# Patient Record
Sex: Male | Born: 1935 | Race: White | Hispanic: No | Marital: Married | Smoking: Former smoker
Health system: Southern US, Community
[De-identification: ages and names within clinical notes are randomized; demographics above are authoritative.]

## PROBLEM LIST (undated history)

## (undated) DIAGNOSIS — E7800 Pure hypercholesterolemia, unspecified: Secondary | ICD-10-CM

## (undated) DIAGNOSIS — K219 Gastro-esophageal reflux disease without esophagitis: Secondary | ICD-10-CM

## (undated) DIAGNOSIS — M1990 Unspecified osteoarthritis, unspecified site: Secondary | ICD-10-CM

## (undated) DIAGNOSIS — R011 Cardiac murmur, unspecified: Secondary | ICD-10-CM

## (undated) DIAGNOSIS — N400 Enlarged prostate without lower urinary tract symptoms: Secondary | ICD-10-CM

## (undated) HISTORY — PX: BACK SURGERY: SHX140

## (undated) HISTORY — PX: CAROTID ENDARTERECTOMY: SUR193

## (undated) HISTORY — PX: TONSILLECTOMY: SUR1361

## (undated) HISTORY — PX: VASCULAR SURGERY: SHX849

---

## 2001-04-23 ENCOUNTER — Encounter: Payer: Self-pay | Admitting: Family Medicine

## 2001-04-23 ENCOUNTER — Ambulatory Visit (HOSPITAL_COMMUNITY): Admission: RE | Admit: 2001-04-23 | Discharge: 2001-04-23 | Payer: Self-pay | Admitting: Family Medicine

## 2011-02-03 ENCOUNTER — Emergency Department (HOSPITAL_COMMUNITY)
Admission: EM | Admit: 2011-02-03 | Discharge: 2011-02-03 | Disposition: A | Discharge status: Home / Self Care | Payer: Medicare PPO | Attending: Emergency Medicine | Admitting: Emergency Medicine

## 2011-02-03 ENCOUNTER — Emergency Department (HOSPITAL_COMMUNITY): Payer: Medicare PPO

## 2011-02-03 DIAGNOSIS — Z79899 Other long term (current) drug therapy: Secondary | ICD-10-CM | POA: Insufficient documentation

## 2011-02-03 DIAGNOSIS — M5126 Other intervertebral disc displacement, lumbar region: Secondary | ICD-10-CM | POA: Insufficient documentation

## 2011-02-03 DIAGNOSIS — R1031 Right lower quadrant pain: Secondary | ICD-10-CM | POA: Insufficient documentation

## 2011-02-03 DIAGNOSIS — R9431 Abnormal electrocardiogram [ECG] [EKG]: Secondary | ICD-10-CM | POA: Insufficient documentation

## 2011-02-03 DIAGNOSIS — K5730 Diverticulosis of large intestine without perforation or abscess without bleeding: Secondary | ICD-10-CM | POA: Insufficient documentation

## 2011-02-03 DIAGNOSIS — I714 Abdominal aortic aneurysm, without rupture, unspecified: Secondary | ICD-10-CM | POA: Insufficient documentation

## 2011-02-03 DIAGNOSIS — I700 Atherosclerosis of aorta: Secondary | ICD-10-CM | POA: Insufficient documentation

## 2011-02-03 DIAGNOSIS — Z7982 Long term (current) use of aspirin: Secondary | ICD-10-CM | POA: Insufficient documentation

## 2011-02-03 DIAGNOSIS — I70209 Unspecified atherosclerosis of native arteries of extremities, unspecified extremity: Secondary | ICD-10-CM | POA: Insufficient documentation

## 2011-02-03 LAB — DIFFERENTIAL
Basophils Absolute: 0.1 10*3/uL (ref 0.0–0.1)
Basophils Relative: 1 % (ref 0–1)
Eosinophils Absolute: 0.4 10*3/uL (ref 0.0–0.7)
Eosinophils Relative: 3 % (ref 0–5)
Lymphocytes Relative: 22 % (ref 12–46)
Lymphs Abs: 3.1 10*3/uL (ref 0.7–4.0)
Monocytes Absolute: 1 10*3/uL (ref 0.1–1.0)
Monocytes Relative: 7 % (ref 3–12)
Neutro Abs: 9.6 10*3/uL — ABNORMAL HIGH (ref 1.7–7.7)
Neutrophils Relative %: 67 % (ref 43–77)

## 2011-02-03 LAB — COMPREHENSIVE METABOLIC PANEL
ALT: 17 U/L (ref 0–53)
AST: 19 U/L (ref 0–37)
Albumin: 3.7 g/dL (ref 3.5–5.2)
Alkaline Phosphatase: 74 U/L (ref 39–117)
BUN: 16 mg/dL (ref 6–23)
CO2: 30 mEq/L (ref 19–32)
Calcium: 9.6 mg/dL (ref 8.4–10.5)
Chloride: 99 mEq/L (ref 96–112)
Creatinine, Ser: 0.89 mg/dL (ref 0.4–1.5)
GFR calc Af Amer: >60 mL/min (ref >60)
GFR calc non Af Amer: >60 mL/min (ref >60)
Glucose, Bld: 94 mg/dL (ref 70–99)
Potassium: 4.2 mEq/L (ref 3.5–5.1)
Sodium: 136 mEq/L (ref 135–145)
Total Bilirubin: 1 mg/dL (ref 0.3–1.2)
Total Protein: 7.5 g/dL (ref 6.0–8.3)

## 2011-02-03 LAB — CBC
HCT: 40.7 % (ref 39.0–52.0)
Hemoglobin: 14.2 g/dL (ref 13.0–17.0)
MCH: 31.3 pg (ref 26.0–34.0)
MCHC: 34.9 g/dL (ref 30.0–36.0)
MCV: 89.8 fL (ref 78.0–100.0)
Platelets: 242 10*3/uL (ref 150–400)
RBC: 4.53 MIL/uL (ref 4.22–5.81)
RDW: 12.8 % (ref 11.5–15.5)
WBC: 14.3 10*3/uL — ABNORMAL HIGH (ref 4.0–10.5)

## 2011-02-03 LAB — URINALYSIS, ROUTINE W REFLEX MICROSCOPIC
Bilirubin Urine: NEGATIVE
Glucose, UA: NEGATIVE mg/dL
Hgb urine dipstick: NEGATIVE
Ketones, ur: NEGATIVE mg/dL
Nitrite: NEGATIVE
Protein, ur: NEGATIVE mg/dL
Specific Gravity, Urine: 1.015 (ref 1.005–1.030)
Urobilinogen, UA: 0.2 mg/dL (ref 0.0–1.0)
pH: 5.5 (ref 5.0–8.0)

## 2011-02-03 MED ORDER — IOHEXOL 300 MG/ML  SOLN
100.0000 mL | Freq: Once | INTRAMUSCULAR | Status: AC | PRN
  Administered 2011-02-03: 100 mL via INTRAVENOUS

## 2012-02-13 ENCOUNTER — Other Ambulatory Visit (HOSPITAL_COMMUNITY): Payer: Self-pay | Admitting: Family Medicine

## 2012-02-13 DIAGNOSIS — N509 Disorder of male genital organs, unspecified: Secondary | ICD-10-CM

## 2012-02-14 ENCOUNTER — Ambulatory Visit (HOSPITAL_COMMUNITY)
Admission: RE | Admit: 2012-02-14 | Discharge: 2012-02-14 | Disposition: A | Discharge status: Home / Self Care | Payer: Medicare PPO | Source: Ambulatory Visit | Attending: Family Medicine | Admitting: Family Medicine

## 2012-02-14 ENCOUNTER — Other Ambulatory Visit (HOSPITAL_COMMUNITY): Payer: Self-pay | Admitting: Family Medicine

## 2012-02-14 DIAGNOSIS — I861 Scrotal varices: Secondary | ICD-10-CM | POA: Insufficient documentation

## 2012-02-14 DIAGNOSIS — N509 Disorder of male genital organs, unspecified: Secondary | ICD-10-CM | POA: Insufficient documentation

## 2013-02-25 ENCOUNTER — Ambulatory Visit (HOSPITAL_COMMUNITY)
Admission: RE | Admit: 2013-02-25 | Discharge: 2013-02-25 | Disposition: A | Discharge status: Home / Self Care | Payer: Medicare Other | Source: Ambulatory Visit | Attending: Family Medicine | Admitting: Family Medicine

## 2013-02-25 DIAGNOSIS — M545 Low back pain, unspecified: Secondary | ICD-10-CM | POA: Insufficient documentation

## 2013-02-25 DIAGNOSIS — M6281 Muscle weakness (generalized): Secondary | ICD-10-CM | POA: Insufficient documentation

## 2013-02-25 DIAGNOSIS — IMO0001 Reserved for inherently not codable concepts without codable children: Secondary | ICD-10-CM | POA: Insufficient documentation

## 2013-02-25 NOTE — Evaluation (Signed)
Physical Therapy Evaluation  Patient Details  Name: Kevin Matthews MRN: [id number] Date of Birth: Mar 26, 1934  Today's Date: 02/25/2013 Time: 0950-1030 PT Time Calculation (min): 40 min Charges: 1 eval, 10' Self Care             Visit#: 1 of 8  Re-eval: 03/27/13 Assessment Diagnosis: LBP Surgical Date: 11/27/12 Next MD Visit: Dr. Regino Schultze - unscheduled   Authorization: Medicare    Authorization Time Period:    Authorization Visit#: 1 of 10   Past Medical History: Numb legs, heart trouble (murmer, aortic anyersium 3x6 Past Surgical History: 2 cervical surgeries (1970), lumbar surgery 11/27/12  Subjective Symptoms/Limitations Symptoms: signgificant PMH: 2 cervical fusions (impairments to LUE and LLE coordination) , lumbar fusion; Medications: asprin, Gabapentin, Loratadine, pravastatin sodium, vicodin Pertinent History: Pt is referred to PT for cervical and LBP.  He has a history of 2 cervical surgiers and most recent lumbar surgery on 11/27/12 (through his [place]).  He has been working on this TM and is now able to walk through [employer] for about 20 minutes (was 5 minutes), c/o is difficulty standing for long periods of time, difficulty walking, pain to his back limiting his ability to participate in golf activities.  How long can you sit comfortably?: no difficulty  How long can you stand comfortably?: less than 10 minutes How long can you walk comfortably?: less than 1/4 mile Patient Stated Goals: wants his back pain to ease up so that he can play golf.  Pain Assessment Currently in Pain?: Yes Pain Score:   3 Pain Location: Back Pain Orientation: Mid;Lower Pain Type: Chronic pain;Acute pain;Surgical pain Pain Onset: More than a month ago Pain Frequency: Constant Pain Relieving Factors: pain medication PRN Effect of Pain on Daily Activities: unable to play golf  Precautions/Restrictions   No golf  Balance Screening Balance Screen Has the patient fallen in the past 6 months:  Yes How many times?: 3 Has the patient had a decrease in activity level because of a fear of falling? : Yes Is the patient reluctant to leave their home because of a fear of falling? : No  Prior Functioning  Home Living Lives With: Spouse Prior Function Comments: enjoys playing golf  Cognition/Observation Observation/Other Assessments Observations: signfiicant pitting edema to LLE.  Sensation/Coordination/Flexibility/Functional Tests Sensation Light Touch: Impaired by gross assessment Coordination Gross Motor Movements are Fluid and Coordinated: No Coordination and Movement Description: impaired LLE SLR.  impaired coordination requiring TC and VC for activation of: transverse abdominus, pelvic floor and multifidus musculature Finger Nose Finger Test: impaired to LUE Flexibility Hoovers: Positive Thomas: Positive Functional Tests Functional Tests: Oswestry Disability Index  (ODI):  Functional Tests: + Ely's  RLE Strength Right Hip Flexion: 4/5 Right Hip Extension: 3-/5 Right Hip ABduction: 3+/5 Right Knee Flexion: 4/5 Right Knee Extension: 5/5 Right Ankle Dorsiflexion: 4/5 LLE Strength Left Hip Flexion: 3+/5 Left Hip Extension: 3-/5 Left Hip ABduction: 3+/5 Left Knee Flexion: 4/5 Left Knee Extension: 5/5 Left Ankle Dorsiflexion: 3+/5  Palpation: fascial restriction to lumbar scar, increased muscle spasms to lumbar erector spinae.   Mobility/Balance  Ambulation/Gait Ambulation/Gait: Yes Assistive device: Straight cane Gait Pattern: Lateral hip instability;Ataxic Static Standing Balance Single Leg Stance - Right Leg: 3 Single Leg Stance - Left Leg: 2 Tandem Stance - Right Leg: 7 Tandem Stance - Left Leg: 5 Rhomberg - Eyes Opened: 10 Rhomberg - Eyes Closed: 1   Exercise/Treatments Seated Other Seated Lumbar Exercises: Heel Roll outs 2x10 sec holds Supine Ab Set: Limitations AB Set  Limitations: VC and TC for proper activition, 3x10 sec holds after..  Pelvic  Floor set: VC for activation 2x10 sec holds Quadruped Other Quadruped Lumbar Exercises: Mulifidus: VC and TC for activation 2x10 sec holds   Physical Therapy Assessment and Plan PT Assessment and Plan Clinical Impression Statement: Kevin Matthews is a 79 year old male referred to PT s/p lumbar fusion in [place] with significant cervical surgeries 30 years ago which left him with LUE and LLE coordination impairments which he has been able to overcome.  At this time his current core and leg weakness from lumbar surgery has limited him in his ability to participate in leisure activities. Pt will benefit from skilled therapeutic intervention in order to improve on the following deficits: Abnormal gait;Decreased balance;Decreased activity tolerance;Decreased coordination;Pain;Decreased strength;Impaired perceived functional ability;Increased muscle spasms;Increased fascial restricitons Rehab Potential: Good Clinical Impairments Affecting Rehab Potential: pt able to afford 2x a week.  PT Frequency: Min 2X/week PT Duration: 6 weeks PT Treatment/Interventions: Gait training;Stair training;Functional mobility training;Therapeutic activities;Therapeutic exercise;Balance training;Neuromuscular re-education;Patient/family education;Manual techniques;Modalities PT Plan: Continue with education on approprirate core strength, compression garments for LLE to control swelling.  Progress to [facility] program to improve body awareness and strength.     Goals Home Exercise Program Pt will Perform Home Exercise Program: Independently PT Goal: Perform Home Exercise Program - Progress: Goal set today PT Short Term Goals Time to Complete Short Term Goals: 3 weeks PT Short Term Goal 1: Pt will report lumbar pain less than 3/10 for 75% of his day in order to return to golf activties.  PT Short Term Goal 2: Pt will be independent with core coordination and activiation of transverse abdominus, pelvic floor and  multifidus in order to progress to improve core strength.  PT Short Term Goal 3: Pt will verabalize importance of posture and approprirate posture to decrease risk of secondary impairments.  PT Short Term Goal 4: Pt will demonstrate rhomberg eyes closed stance x10 seconds for improved body awareness.  PT Long Term Goals Time to Complete Long Term Goals:  (6 weeks) PT Long Term Goal 1: Pt will improve core strength to [facility] in order to tolerate standing for greater than 30 minutes with pain less than 3/10 in order to retun to golf activities.  PT Long Term Goal 2: Pt will improve body awareness and demonstrate Right and Lt tandem stance x30 seconds on static surface in order to decrease risk of falls.  Long Term Goal 3: Pt will improve BLE strength in order to tolerate ambulating for greater than 30 minutes in order to attend community functions.  Long Term Goal 4: Pt will improve his ODI to less than 25% in order improve QOL.   Problem List Patient Active Problem List   Diagnosis Date Noted  . Lumbago 02/25/2013    General Behavior During Therapy: [facility] for tasks assessed/performed Cognition: [facility] for tasks performed PT Plan of Care PT Home Exercise Plan: see scanned report PT Patient Instructions: discussed: compression garments, core musculature, POC and HEP Consulted and Agree with Plan of Care: Patient  GP Functional Limitation: Self care Self Care Current Status (Z6109): At least 40 percent but less than 60 percent impaired, limited or restricted Self Care Goal Status (U0454): At least 1 percent but less than 20 percent impaired, limited or restricted  Cadance Raus 02/25/2013, 10:47 AM  Physician Documentation Your signature is required to indicate approval of the treatment plan as stated above.  Please sign and either send electronically or make  a copy of this report for your files and return this physician signed original.   Please mark one 1.__approve of plan  2. ___approve of plan  with the following conditions.   ______________________________                                                          _____________________ Physician Signature                                                                                                             Date

## 2013-02-26 ENCOUNTER — Ambulatory Visit (HOSPITAL_COMMUNITY)
Admission: RE | Admit: 2013-02-26 | Discharge: 2013-02-26 | Disposition: A | Discharge status: Home / Self Care | Payer: Medicare Other | Source: Ambulatory Visit | Attending: Family Medicine | Admitting: Family Medicine

## 2013-02-26 NOTE — Progress Notes (Signed)
Physical Therapy Treatment Patient Details  Name: Kevin Matthews MRN: [id number] Date of Birth: 18-Jan-1934  Today's Date: 02/26/2013 Time: 1105 (startedm by PTA (AF))-1145 PT Time Calculation (min): 40 min Charges: 49' TE Visit#: 2 of 8  Re-eval: 03/27/13    Authorization: Medicare  Authorization Time Period:    Authorization Visit#: 2 of 10   Subjective: Symptoms/Limitations Symptoms: Pt reports that he is not hurting to bad today.  He reports that he slept well last night and working on tightening his stomach all the way to [place] Pain Assessment Currently in Pain?: Yes Pain Score:   2 Pain Location: Back  Precautions/Restrictions     Exercise/Treatments Stretches Prone on Elbows Stretch: 1 rep;Limitations Prone on Elbows Stretch Limitations: 3 minutes with multifidus exercise Aerobic Elliptical: NuStep: resistance 0  Standing Heel Raises: 10 reps;Limitations Heel Raises Limitations: toe raises 10 reps Functional Squats: 10 reps;Limitations Functional Squats Limitations: VC and TC for proper posture.  Other Standing Lumbar Exercises: Knee flexion: Both; 15 reps Other Standing Lumbar Exercises: Hip abduction: Both, 10 reps; Hip extension Both. 10 reps Supine Ab Set: 10 reps;Limitations AB Set Limitations: 10 sec holds with verbal and tactile cueing Bent Knee Raise: 10 reps;Limitations Bent Knee Raise Limitations: alternating with ab set Bridge: 10 reps;5 seconds Prone  Other Prone Lumbar Exercises: Multifidus: 10x 19 sec holds with verbal cueing,  visual and tactile cueing to multifidus  Physical Therapy Assessment and Plan PT Assessment and Plan Clinical Impression Statement: Pt has improved coordination with multifidus activity with visual cueing to improve Lt side multifidus. Added standing activities to improve LE strength and balance.  Pt has notable fatigue after standing for 10 minutes.  Rehab Potential: Good Clinical Impairments Affecting Rehab  Potential: pt able to afford 2x a week.  PT Frequency: Min 2X/week PT Duration: 6 weeks PT Plan: Continue with education on approprirate core strength, compression garments for LLE to control swelling.  Progress to [facility] program to improve body awareness and strength.     Goals    Problem List Patient Active Problem List   Diagnosis Date Noted  . Lumbago 02/25/2013    General Behavior During Therapy: [facility] for tasks assessed/performed Cognition: [facility] for tasks performed  GP    Santresa Levett, PT 02/26/2013, 12:00 PM

## 2013-03-04 ENCOUNTER — Ambulatory Visit (HOSPITAL_COMMUNITY)
Admission: RE | Admit: 2013-03-04 | Discharge: 2013-03-04 | Disposition: A | Discharge status: Home / Self Care | Payer: Medicare Other | Source: Ambulatory Visit | Attending: Family Medicine | Admitting: Family Medicine

## 2013-03-04 DIAGNOSIS — IMO0001 Reserved for inherently not codable concepts without codable children: Secondary | ICD-10-CM | POA: Insufficient documentation

## 2013-03-04 DIAGNOSIS — M545 Low back pain, unspecified: Secondary | ICD-10-CM | POA: Insufficient documentation

## 2013-03-04 DIAGNOSIS — M6281 Muscle weakness (generalized): Secondary | ICD-10-CM | POA: Insufficient documentation

## 2013-03-04 NOTE — Progress Notes (Signed)
Physical Therapy Treatment Patient Details  Name: CASHIS RILL MRN: [id number] Date of Birth: 05-18-1934  Today's Date: 03/04/2013 Time: 1324-4010 PT Time Calculation (min): 40 min Charges: 69' TE Visit#: 3 of 8  Re-eval: 03/27/13    Authorization: Medicare  Authorization Time Period:    Authorization Visit#: 3 of 10   Subjective: Symptoms/Limitations Symptoms: Pt reports that he is sore from playing golf yesterday.  He has been working on his core strengthening, has not had time to do his other exercises.  Pain Assessment Currently in Pain?: Yes Pain Score:   3 Pain Location: Back Pain Orientation: Mid;Lower  Precautions/Restrictions     Exercise/Treatments Standing Heel Raises: 20 reps Heel Raises Limitations: toe raises 20 reps Row: Both;20 reps;Theraband Theraband Level (Row): Level 2 (Red) Shoulder Extension: Both;20 reps;Theraband Theraband Level (Shoulder Extension): Level 2 (Red) Supine Clam: 10 reps;Limitations Clam Limitations: alternating with ab set Bent Knee Raise: 10 reps Bent Knee Raise Limitations: alternating with ab set Bridge: 10 reps;5 seconds (w/adductor squeezxe) Straight Leg Raise: 10 reps (Both) Quadruped Other Quadruped Lumbar Exercises: Quadrices stretch 3x30 sec BLE (manual assistance).  Hip IR stretch 2x60 sec BLE (manually).   Physical Therapy Assessment and Plan PT Assessment and Plan Clinical Impression Statement: Pt has improved coordinated movements to core musculature.  Added standing t-band activities to improve postural strength and awareness.  Pt pain did not change during treatment today.   Clinical Impairments Affecting Rehab Potential: pt able to afford 2x a week.  PT Plan: Progress to [facility] program to improve body awareness and strength.     Goals    Problem List Patient Active Problem List   Diagnosis Date Noted  . Lumbago 02/25/2013    General Behavior During Therapy: [facility] for tasks  assessed/performed Cognition: [facility] for tasks performed  GP    Raney Antwine 03/04/2013, 11:59 AM

## 2013-03-06 ENCOUNTER — Ambulatory Visit (HOSPITAL_COMMUNITY)
Admission: RE | Admit: 2013-03-06 | Discharge: 2013-03-06 | Disposition: A | Discharge status: Home / Self Care | Payer: Medicare Other | Source: Ambulatory Visit | Attending: Family Medicine | Admitting: Family Medicine

## 2013-03-06 NOTE — Progress Notes (Signed)
Physical Therapy Treatment Patient Details  Name: Kevin Matthews MRN: [id number] Date of Birth: [birth year]  Today's Date: 03/06/2013 Time: 1102-1148 PT Time Calculation (min): 46 min Charge: Gait 8', therex 106'  Visit#: 4 of 8  Re-eval: 03/27/13    Authorization: Medicare  Authorization Time Period:    Authorization Visit#: 4 of 10   Subjective: Symptoms/Limitations Symptoms: Pt reported Bil LE increase musculature pain scale 2/10, back is feeling okay today.  Going to play golf tomorrow. Pain Assessment Currently in Pain?: Yes Pain Score:   2 Pain Location: Leg Pain Orientation: Right;Left  Objective:   Exercise/Treatments Standing Scapular Retraction: Both;15 reps;Theraband Theraband Level (Scapular Retraction): Level 3 (Green) Row: Both;Theraband;15 reps Theraband Level (Row): Level 3 (Green) Shoulder Extension: Both;15 reps Theraband Level (Shoulder Extension): Level 3 (Green) Other Standing Lumbar Exercises: Golf swing high and low 15 reps each with green tband  Stretches Passive Hamstring Stretch: 2 reps;30 seconds;Limitations Passive Hamstring Stretch Limitations: w/ rope Standing Heel Raises: 20 reps;Limitations Heel Raises Limitations: toe raises 20 reps Knee Flexion: Both;15 reps Terminal Knee Extension: Both;10 reps;Theraband Theraband Level (Terminal Knee Extension): Level 4 (Blue) Functional Squat: 15 reps;Limitations Functional Squat Limitations: with TC and VC for proper form Gait Training: Gait training no AD with min A for LOB with cueing for knee extension with heel to toe pattern and posture Other Standing Knee Exercises: feet side by side with pertubations x 2 minutes, partial tandem stance 2x 30" Other Standing Knee Exercises: hip abduction and extension x 10 with TC for posture/form    Physical Therapy Assessment and Plan PT Assessment and Plan Clinical Impression Statement: Progressed otago balance activities with min assistance for LOB  episodes.  Gait training complete to improve mechanics with no AD and min assistance for LOB episodes and cueing for knee extensoin with heel to toe pattern.  Added TKE to improve quad strengthening to improve gait, instructued hamstring stretch to pt and wife to add to HEP for hamstring lengthening to assist with knee extension and improve hip mobility for LBP relief.  Pt with cueing required for core musculature activation with tband activitieis, improved coordination. PT Plan: Progress to [facility] program to improve body awareness and strength.     Goals    Problem List Patient Active Problem List   Diagnosis Date Noted  . Lumbago 02/25/2013    PT - End of Session Activity Tolerance: Patient tolerated treatment well General Behavior During Therapy: WFL for tasks assessed/performed Cognition: WFL for tasks performed  GP    [name] 03/06/2013, 12:17 PM

## 2013-03-11 ENCOUNTER — Ambulatory Visit (HOSPITAL_COMMUNITY)
Admission: RE | Admit: 2013-03-11 | Discharge: 2013-03-11 | Disposition: A | Discharge status: Home / Self Care | Payer: Medicare Other | Source: Ambulatory Visit | Attending: Family Medicine | Admitting: Family Medicine

## 2013-03-11 NOTE — Progress Notes (Signed)
Physical Therapy Treatment Patient Details  Name: GEARY RUFO MRN: [id number] Date of Birth: [birth year]  Today's Date: 03/11/2013 Time: 7829-5621 PT Time Calculation (min): 46 min Charge: Gait 8', Therex 38'  Visit#: 5 of 8  Re-eval: 03/27/13 Assessment Diagnosis: LBP Surgical Date: 11/27/12 Next MD Visit: Dr. Regino Schultze - unscheduled   Authorization: Medicare  Authorization Time Period:    Authorization Visit#: 5 of 10   Subjective: Symptoms/Limitations Symptoms: Pt reprted he was sore following last session, reported 2 falls while playing golf last Friday from LOB episodes.  Had MRI yesterday. Pain Assessment Currently in Pain?: Yes Pain Score:   2 Pain Location: Leg Pain Orientation: Left;Right  Objective:   Exercise/Treatments Standing Heel Raises: 20 reps;Limitations Heel Raises Limitations: toe raises 20 reps Functional Squat: 15 reps;Limitations Functional Squat Limitations: with TC and VC for proper form Gait Training: Gait training no AD with min A for LOB with cueing for knee extension with heel to toe pattern and posture Other Standing Knee Exercises: hip abduction and extension x 10 with TC for posture/form Sidelying Hip ABduction: Both;10 reps Hip ADduction: Both;10 reps Clams: 5x 10" Bil LE with TC Prone  Hip Extension: Both;10 reps Other Prone Exercises: TKE 10x 10"      Physical Therapy Assessment and Plan PT Assessment and Plan Clinical Impression Statement: Gait training to improve mechanics with cueing for posture and heel to toe pattern.  Pt with very NBOS with gait, cueing to increase width with BOS to reduce LOB episodes.  Session focus on gluteal strengthening to improve gait mechanics.  Multimodal cueng required for form with standing hip abduction and extension due to weakl musculature.  Pt educated on importance of completeing HEP to assist with functional strengthening. PT Plan: Progress to [facility] program to improve body  awareness and strength.     Goals    Problem List Patient Active Problem List   Diagnosis Date Noted  . Lumbago 02/25/2013    PT - End of Session Activity Tolerance: Patient tolerated treatment well General Behavior During Therapy: WFL for tasks assessed/performed Cognition: WFL for tasks performed  GP    [name] 03/11/2013, 11:49 AM

## 2013-03-13 ENCOUNTER — Ambulatory Visit (HOSPITAL_COMMUNITY): Payer: Medicare Other | Admitting: Physical Therapy

## 2013-03-18 ENCOUNTER — Ambulatory Visit (HOSPITAL_COMMUNITY): Payer: Medicare Other | Admitting: Physical Therapy

## 2013-03-20 ENCOUNTER — Ambulatory Visit (HOSPITAL_COMMUNITY)
Admission: RE | Admit: 2013-03-20 | Discharge: 2013-03-20 | Disposition: A | Discharge status: Home / Self Care | Payer: Medicare Other | Source: Ambulatory Visit | Attending: Family Medicine | Admitting: Family Medicine

## 2013-03-20 NOTE — Progress Notes (Signed)
Physical Therapy Treatment Patient Details  Name: Kevin Matthews MRN: [id number] Date of Birth: Jul 31, 1934  Today's Date: 03/20/2013 Time: 4098-1191 PT Time Calculation (min): 49 min Charge: Gait 8', Therex 23', NMR 15'  Visit#: 6 of 8  Re-eval: 03/27/13 Assessment Diagnosis: LBP Surgical Date: 11/27/12 Next MD Visit: Dr. Regino Schultze - unscheduled   Authorization: Medicare  Authorization Time Period:    Authorization Visit#: 6 of 10   Subjective: Symptoms/Limitations Symptoms: Pt stated he feels his walking is improving.  Went golfing yesterday with no falls.  Pt feels his balance is biggest difficulty.  Reports pain Rt calf 2/10 Pain Assessment Currently in Pain?: Yes Pain Score:   2 Pain Location: Calf Pain Orientation: Right  Objective:   Exercise/Treatments Stretches Gastroc Stretch: 3 reps;30 seconds;Limitations Gastroc Stretch Limitations: slant board Standing Heel Raises: 20 reps;Limitations Heel Raises Limitations: toe raises 20 reps Functional Squat: 15 reps;Limitations Gait Training: Gait training no AD with min A for LOB with cueing for knee extension with heel to toe pattern and posture Other Standing Knee Exercises: tandem stance 2x 30", tandem, retro and side stepping 2RT Other Standing Knee Exercises: hip abduction and extension x 10 with TC for posture/form    Physical Therapy Assessment and Plan PT Assessment and Plan Clinical Impression Statement: Continued gait training to improve mechanics with cueing for knee extension with heel to toe gait pattern, decreased LOB episdoes with gait training this session.  Added balance activities to POC with min assistance for LOB episodes and cueing to improve spatial awareness.   PT Plan: Progress to [facility] program to improve body awareness and strength.  Begin gait training on TM next session.  Add quad strengtheing exercises to improve knee extension with gait activities.    Goals    Problem  List Patient Active Problem List   Diagnosis Date Noted  . Lumbago 02/25/2013    PT - End of Session Equipment Utilized During Treatment: Gait belt Activity Tolerance: Patient tolerated treatment well General Behavior During Therapy: [facility] for tasks assessed/performed Cognition: WFL for tasks performed  GP    [name] 03/20/2013, 12:31 PM

## 2013-03-25 ENCOUNTER — Ambulatory Visit (HOSPITAL_COMMUNITY)
Admission: RE | Admit: 2013-03-25 | Discharge: 2013-03-25 | Disposition: A | Discharge status: Home / Self Care | Payer: Medicare Other | Source: Ambulatory Visit | Attending: Family Medicine | Admitting: Family Medicine

## 2013-03-25 NOTE — Evaluation (Signed)
Physical Therapy Re-Evaluation/G-code update  Patient Details  Name: Kevin Matthews MRN: [id number] Date of Birth: 03/10/1934  Today's Date: 03/25/2013 Time: 1102-1150 PT Time Calculation (min): 48 min Charges: Self Care x30, TE x 8, Manual x10             Visit#: 7 of 8  Re-eval: 04/24/13 Assessment Diagnosis: LBP Surgical Date: 11/27/12 Next MD Visit: Dr. Regino Schultze - unscheduled   Authorization: Medicare    Authorization Time Period:    Authorization Visit#: 7 of 17    Subjective Symptoms/Limitations Symptoms: Pt reports that overall he is seeing slight improvements since he has started.   Pain Assessment Currently in Pain?: Yes  Sensation/Coordination/Flexibility/Functional Tests Functional Tests Functional Tests: Oswestry Disability Index  (ODI): 32%  Mobility/Balance  Static Standing Balance Single Leg Stance - Right Leg: 6 (was 3 seconds) Single Leg Stance - Left Leg: 6 (was 2 seconds) Tandem Stance - Right Leg: 10 (was 7 seconds) Tandem Stance - Left Leg: 10 (was 5 seconds) Rhomberg - Eyes Closed: 10 (was 1 second)   Exercise/Treatments Stretches Active Hamstring Stretch: 2 reps;30 seconds;Limitations Active Hamstring Stretch Limitations: w/towel Single Knee to Chest Stretch: 2 reps;30 seconds;Limitations Single Knee to Chest Stretch Limitations: w/towel Lower Trunk Rotation: 3 reps;10 seconds Supine Bridge: 10 reps Manual Therapy Manual Therapy: Myofascial release Myofascial Release: using hawk grips to thoraco-lumbar fascia x10 minutes  Physical Therapy Assessment and Plan PT Assessment and Plan Clinical Impression Statement: [name] has attended 7 OP PT visits over 4 weeks to address LBP with the following findings: he has improved his balance, core coordination and has decreased pain overall and is able to walk and stand longer with decreased pain, and continues to have increased fascial restrictions and decreased flexibility to BLE which is likely  causing increased pain. Pt will benefit from skilled therapeutic intervention in order to improve on the following deficits: Decreased balance;Decreased strength;Difficulty walking;Pain;Increased fascial restricitons;Impaired perceived functional ability PT Frequency: Min 2X/week PT Duration:  (2 weeks) PT Plan: Continue with flexibility activities, f/u on manual techiques, may add manual techniques to LLE: gluteal, hamstrings, gastroc region. Encourage HEP.     Goals Home Exercise Program Pt will Perform Home Exercise Program: Independently PT Goal: Perform Home Exercise Program - Progress: Progressing toward goal PT Short Term Goals Time to Complete Short Term Goals: 3 weeks PT Short Term Goal 1: Pt will report lumbar pain less than 3/10 for 75% of his day in order to return to golf activties.  (2/10 worse pain in the morning) PT Short Term Goal 1 - Progress: Met PT Short Term Goal 2: Pt will be independent with core coordination and activiation of transverse abdominus, pelvic floor and multifidus in order to progress to improve core strength.  PT Short Term Goal 2 - Progress: Met PT Short Term Goal 3: Pt will verabalize importance of posture and approprirate posture to decrease risk of secondary impairments.  PT Short Term Goal 3 - Progress: Partly met PT Short Term Goal 4: Pt will demonstrate rhomberg eyes closed stance x10 seconds for improved body awareness.  PT Short Term Goal 4 - Progress: Met PT Long Term Goals Time to Complete Long Term Goals:  (6 weeks) PT Long Term Goal 1: Pt will improve core strength to [facility] in order to tolerate standing for greater than 30 minutes with pain less than 3/10 in order to retun to golf activities.  (30-40 minutes most of the time. ) PT Long Term Goal 1 - Progress:  Met PT Long Term Goal 2: Pt will improve body awareness and demonstrate Right and Lt tandem stance x30 seconds on static surface in order to decrease risk of falls.  PT Long Term Goal 2 -  Progress: Progressing toward goal Long Term Goal 3: Pt will improve BLE strength in order to tolerate ambulating for greater than 30 minutes in order to attend community functions.  (30-40 minutes through [employer]) Long Term Goal 3 Progress: Met Long Term Goal 4: Pt will improve his ODI to less than 25% in order improve QOL.  (32%) Long Term Goal 4 Progress: Progressing toward goal  Problem List Patient Active Problem List   Diagnosis Date Noted  . Lumbago 02/25/2013    PT - End of Session Equipment Utilized During Treatment: Gait belt Activity Tolerance: Patient tolerated treatment well General Behavior During Therapy: WFL for tasks assessed/performed Cognition: WFL for tasks performed PT Plan of Care PT Home Exercise Plan: updated with flexibility activities for use in the morning. PT Patient Instructions: Teach back for flexibility exercises.  Consulted and Agree with Plan of Care: Patient  GP Functional Assessment Tool Used: ODI: self Care Self Care Current Status [phone]): At least 20 percent but less than 40 percent impaired, limited or restricted Self Care Goal Status (X9147): At least 1 percent but less than 20 percent impaired, limited or restricted  Sheyla Zaffino, MPT, ATC 03/25/2013, 12:15 PM  Physician Documentation Your signature is required to indicate approval of the treatment plan as stated above.  Please sign and either send electronically or make a copy of this report for your files and return this physician signed original.   Please mark one 1.__approve of plan  2. ___approve of plan with the following conditions.   ______________________________                                                          _____________________ Physician Signature                                                                                                             Date

## 2013-03-27 ENCOUNTER — Ambulatory Visit (HOSPITAL_COMMUNITY)
Admission: RE | Admit: 2013-03-27 | Discharge: 2013-03-27 | Disposition: A | Discharge status: Home / Self Care | Payer: Medicare Other | Source: Ambulatory Visit | Attending: Family Medicine | Admitting: Family Medicine

## 2013-03-27 NOTE — Evaluation (Signed)
Physical Therapy Discharge Patient Details  Name: Kevin Matthews MRN: [id number] Date of Birth: [birth year]  Today's Date: 03/27/2013 Time: 1100-1131 PT Time Calculation (min): 31 min Charges: 15' Manual, 10' Self care, 1 MMT, 2' TE             Visit#: 8 of 8  Re-eval: 04/24/13 Assessment Diagnosis: LBP Surgical Date: 11/27/12 Next MD Visit: Dr. Regino Schultze - unscheduled   Authorization: Medicare    Authorization Time Period:    Authorization Visit#: 8 of 17   Past Medical History: No past medical history on file. Past Surgical History: No past surgical history on file.  Subjective Symptoms/Limitations Symptoms: Pt states that he wishes to complete his therapy today.  He states that after the last manual treatment he was very sore the next day, but today he is feeling a lot better.  Pain Assessment Currently in Pain?: Yes Pain Score:   2 Pain Location: Back  Sensation/Coordination/Flexibility/Functional Tests Functional Tests Functional Tests: Oswestry Disability Index  (ODI): 32%  Assessment RLE Strength Right Hip Flexion:  (4+/5, was 4/5) Right Hip Extension: 3+/5 (was 3-/5) Right Hip ABduction: 4/5 (was 3+/5) Right Knee Flexion: 5/5 (was 4/5) Right Knee Extension: 5/5 (was 5/5) Right Ankle Dorsiflexion: 5/5 (was 4/5) LLE Strength Left Hip Flexion: 4/5 (was 3+/5) Left Hip Extension: 3+/5 (was 3-/5) Left Hip ABduction: 3+/5 (was 3+/5) Left Knee Flexion: 5/5 (was 4/5) Left Knee Extension: 5/5 (was 5/5) Left Ankle Dorsiflexion: 5/5 (was 3+/5) Palpation Palpation: moderate muscle spasm to Lt Quadratus lumborum, without fascial restrictions to lumbar region  Exercise/Treatments Seated Other Seated Lumbar Exercises: Lt quadratus lumborum stretch: 2x30 sec; Thoracolumbar fascia stretch 2x30 sec  Manual Therapy Myofascial Release: Prone: to Lt quadratus lumborum and lt errector spinae x15 minutes   Physical Therapy Assessment and Plan PT Assessment and  Plan Clinical Impression Statement: Pt has improved his overall strength by approximatly 1/2 muslce grade throughout his LE and with less fascial restiricitons.  Continues to have greatest difficult with his muscle spasm to Lt quadratus lumborum.  After discussion with the patient, he wishes to complete his physical therapy at this time and continue with his HEP.  PT Duration:  (2 weeks) PT Plan: D/C per pt request    Goals  see previous note for goals.   Problem List Patient Active Problem List   Diagnosis Date Noted  . Lumbago 02/25/2013    PT - End of Session Equipment Utilized During Treatment: Gait belt Activity Tolerance: Patient tolerated treatment well General Behavior During Therapy: WFL for tasks assessed/performed Cognition: WFL for tasks performed PT Plan of Care PT Patient Instructions: discussed d/c and HEP.    GP Functional Assessment Tool Used: ODI: self Care Functional Limitation: Self care Self Care Current Status (W0981): At least 20 percent but less than 40 percent impaired, limited or restricted Self Care Goal Status (X9147): At least 1 percent but less than 20 percent impaired, limited or restricted Self Care Discharge Status [phone]): At least 20 percent but less than 40 percent impaired, limited or restricted  Kimara Bencomo 03/27/2013, 11:49 AM  Physician Documentation Your signature is required to indicate approval of the treatment plan as stated above.  Please sign and either send electronically or make a copy of this report for your files and return this physician signed original.   Please mark one 1.__approve of plan  2. ___approve of plan with the following conditions.   ______________________________  _____________________ Physician Signature                                                                                                             Date

## 2013-04-01 ENCOUNTER — Inpatient Hospital Stay (HOSPITAL_COMMUNITY): Admission: RE | Admit: 2013-04-01 | Payer: Medicare Other | Source: Ambulatory Visit

## 2013-04-03 ENCOUNTER — Ambulatory Visit (HOSPITAL_COMMUNITY): Payer: Medicare Other | Admitting: Physical Therapy

## 2013-12-24 ENCOUNTER — Other Ambulatory Visit: Payer: Self-pay | Admitting: Neurosurgery

## 2013-12-26 ENCOUNTER — Encounter (HOSPITAL_COMMUNITY): Payer: Self-pay | Admitting: Pharmacy Technician

## 2013-12-31 NOTE — Pre-Procedure Instructions (Signed)
Kevin Matthews  12/31/2013   Your procedure is scheduled on:  01/07/14  Report to [place]  2 * 3 at 8 AM.  Call this number if you have problems the morning of surgery: [phone]   Remember:   Do not eat food or drink liquids after midnight.   Take these medicines the morning of surgery with A SIP OF WATER: neurontin,prilosec   Do not wear jewelry, make-up or nail polish.  Do not wear lotions, powders, or perfumes. You may wear deodorant.  Do not shave 48 hours prior to surgery. Men may shave face and neck.  Do not bring valuables to the hospital.  [facility] is not responsible                  for any belongings or valuables.               Contacts, dentures or bridgework may not be worn into surgery.  Leave suitcase in the car. After surgery it may be brought to your room.  For patients admitted to the hospital, discharge time is determined by your                treatment team.               Patients discharged the day of surgeryill not be allowed to drive  home.  Name and phone number of your driver:   Special Instructions: Shower using CHG 2 nights before surgery and the night before surgery.  If you shower the day of surgery use CHG.  Use special wash - you have one bottle of CHG for all showers.  You should use approximately 1/3 of the bottle for each shower.   Please read over the following fact sheets that you were given: Pain Booklet, Coughing and Deep Breathing, MRSA Information and Surgical Site Infection Prevention

## 2014-01-01 ENCOUNTER — Encounter (HOSPITAL_COMMUNITY)
Admission: RE | Admit: 2014-01-01 | Discharge: 2014-01-01 | Disposition: A | Discharge status: Home / Self Care | Payer: Medicare Other | Source: Ambulatory Visit | Attending: Neurosurgery | Admitting: Neurosurgery

## 2014-01-01 ENCOUNTER — Encounter (HOSPITAL_COMMUNITY): Payer: Self-pay

## 2014-01-01 DIAGNOSIS — Z01810 Encounter for preprocedural cardiovascular examination: Secondary | ICD-10-CM | POA: Insufficient documentation

## 2014-01-01 DIAGNOSIS — Z01812 Encounter for preprocedural laboratory examination: Secondary | ICD-10-CM | POA: Insufficient documentation

## 2014-01-01 HISTORY — DX: Unspecified osteoarthritis, unspecified site: M19.90

## 2014-01-01 HISTORY — DX: Cardiac murmur, unspecified: R01.1

## 2014-01-01 HISTORY — DX: Gastro-esophageal reflux disease without esophagitis: K21.9

## 2014-01-01 LAB — CBC
HEMATOCRIT: 37.8 % — AB (ref 39.0–52.0)
Hemoglobin: 12.8 g/dL — ABNORMAL LOW (ref 13.0–17.0)
MCH: 30.3 pg (ref 26.0–34.0)
MCHC: 33.9 g/dL (ref 30.0–36.0)
MCV: 89.6 fL (ref 78.0–100.0)
PLATELETS: 229 10*3/uL (ref 150–400)
RBC: 4.22 MIL/uL (ref 4.22–5.81)
RDW: 13.2 % (ref 11.5–15.5)
WBC: 14.6 10*3/uL — AB (ref 4.0–10.5)

## 2014-01-01 LAB — SURGICAL PCR SCREEN
MRSA, PCR: NEGATIVE
Staphylococcus aureus: NEGATIVE

## 2014-01-01 LAB — BASIC METABOLIC PANEL
BUN: 17 mg/dL (ref 6–23)
CALCIUM: 9.4 mg/dL (ref 8.4–10.5)
CO2: 26 mEq/L (ref 19–32)
CREATININE: 1.12 mg/dL (ref 0.50–1.35)
Chloride: 100 mEq/L (ref 96–112)
GFR, EST AFRICAN AMERICAN: 71 mL/min — AB (ref >90)
GFR, EST NON AFRICAN AMERICAN: 61 mL/min — AB (ref >90)
Glucose, Bld: 111 mg/dL — ABNORMAL HIGH (ref 70–99)
Potassium: 4.1 mEq/L (ref 3.7–5.3)
Sodium: 138 mEq/L (ref 137–147)

## 2014-01-06 MED ORDER — CEFAZOLIN SODIUM-DEXTROSE 2-3 GM-% IV SOLR
2.0000 g | INTRAVENOUS | Status: AC
  Administered 2014-01-07: 2 g via INTRAVENOUS
  Filled 2014-01-06: qty 50

## 2014-01-07 ENCOUNTER — Observation Stay (HOSPITAL_COMMUNITY)
Admission: RE | Admit: 2014-01-07 | Discharge: 2014-01-08 | Disposition: A | Discharge status: Home / Self Care | Payer: Non-veteran care | Source: Ambulatory Visit | Attending: Neurosurgery | Admitting: Neurosurgery

## 2014-01-07 ENCOUNTER — Encounter (HOSPITAL_COMMUNITY): Payer: Self-pay | Admitting: *Deleted

## 2014-01-07 ENCOUNTER — Ambulatory Visit (HOSPITAL_COMMUNITY): Payer: Non-veteran care

## 2014-01-07 ENCOUNTER — Ambulatory Visit (HOSPITAL_COMMUNITY): Payer: Non-veteran care | Admitting: Anesthesiology

## 2014-01-07 ENCOUNTER — Encounter (HOSPITAL_COMMUNITY): Payer: Non-veteran care | Admitting: Anesthesiology

## 2014-01-07 ENCOUNTER — Encounter (HOSPITAL_COMMUNITY)
Admission: RE | Disposition: A | Discharge status: Home / Self Care | Payer: Self-pay | Source: Ambulatory Visit | Attending: Neurosurgery

## 2014-01-07 DIAGNOSIS — K219 Gastro-esophageal reflux disease without esophagitis: Secondary | ICD-10-CM | POA: Insufficient documentation

## 2014-01-07 DIAGNOSIS — M129 Arthropathy, unspecified: Secondary | ICD-10-CM | POA: Insufficient documentation

## 2014-01-07 DIAGNOSIS — M48062 Spinal stenosis, lumbar region with neurogenic claudication: Principal | ICD-10-CM | POA: Diagnosis present

## 2014-01-07 DIAGNOSIS — Z87891 Personal history of nicotine dependence: Secondary | ICD-10-CM | POA: Insufficient documentation

## 2014-01-07 DIAGNOSIS — Z7982 Long term (current) use of aspirin: Secondary | ICD-10-CM | POA: Insufficient documentation

## 2014-01-07 DIAGNOSIS — R011 Cardiac murmur, unspecified: Secondary | ICD-10-CM | POA: Insufficient documentation

## 2014-01-07 HISTORY — PX: LUMBAR LAMINECTOMY/DECOMPRESSION MICRODISCECTOMY: SHX5026

## 2014-01-07 SURGERY — LUMBAR LAMINECTOMY/DECOMPRESSION MICRODISCECTOMY 2 LEVELS
Anesthesia: General | Site: Back

## 2014-01-07 MED ORDER — THROMBIN 5000 UNITS EX SOLR
CUTANEOUS | Status: DC | PRN
Start: 2014-01-07 — End: 2014-01-07
  Administered 2014-01-07 (×2): 5000 [IU] via TOPICAL

## 2014-01-07 MED ORDER — HYDROCODONE-ACETAMINOPHEN 5-325 MG PO TABS
1.0000 | ORAL_TABLET | ORAL | Status: DC | PRN
  Administered 2014-01-07 (×2): 2 via ORAL
  Filled 2014-01-07 (×2): qty 2

## 2014-01-07 MED ORDER — PHENOL 1.4 % MT LIQD: 1.0000 | OROMUCOSAL | Status: DC | PRN

## 2014-01-07 MED ORDER — LACTATED RINGERS IV SOLN
INTRAVENOUS | Status: DC
  Administered 2014-01-07 (×2): via INTRAVENOUS

## 2014-01-07 MED ORDER — GABAPENTIN 300 MG PO CAPS
600.0000 mg | ORAL_CAPSULE | Freq: Three times a day (TID) | ORAL | Status: DC
  Administered 2014-01-07: 600 mg via ORAL
  Filled 2014-01-07 (×4): qty 2

## 2014-01-07 MED ORDER — OXYCODONE HCL 5 MG PO TABS: 5.0000 mg | ORAL_TABLET | Freq: Once | ORAL | Status: DC | PRN

## 2014-01-07 MED ORDER — BACITRACIN ZINC 500 UNIT/GM EX OINT
TOPICAL_OINTMENT | CUTANEOUS | Status: DC | PRN
  Administered 2014-01-07: 1 via TOPICAL

## 2014-01-07 MED ORDER — SUFENTANIL CITRATE 50 MCG/ML IV SOLN
INTRAVENOUS | Status: AC
  Filled 2014-01-07: qty 1

## 2014-01-07 MED ORDER — PROPOFOL 10 MG/ML IV BOLUS
INTRAVENOUS | Status: DC | PRN
  Administered 2014-01-07: 150 mg via INTRAVENOUS

## 2014-01-07 MED ORDER — EPHEDRINE SULFATE 50 MG/ML IJ SOLN
INTRAMUSCULAR | Status: AC
  Filled 2014-01-07: qty 1

## 2014-01-07 MED ORDER — PROPOFOL 10 MG/ML IV BOLUS
INTRAVENOUS | Status: AC
  Filled 2014-01-07: qty 20

## 2014-01-07 MED ORDER — BUPIVACAINE-EPINEPHRINE PF 0.5-1:200000 % IJ SOLN
INTRAMUSCULAR | Status: DC | PRN
  Administered 2014-01-07 (×2): 10 mL

## 2014-01-07 MED ORDER — ONDANSETRON HCL 4 MG/2ML IJ SOLN
INTRAMUSCULAR | Status: AC
  Filled 2014-01-07: qty 2

## 2014-01-07 MED ORDER — LACTATED RINGERS IV SOLN: INTRAVENOUS | Status: DC

## 2014-01-07 MED ORDER — MORPHINE SULFATE 2 MG/ML IJ SOLN: 1.0000 mg | INTRAMUSCULAR | Status: DC | PRN

## 2014-01-07 MED ORDER — ACETAMINOPHEN 650 MG RE SUPP: 650.0000 mg | RECTAL | Status: DC | PRN

## 2014-01-07 MED ORDER — MIDAZOLAM HCL 2 MG/2ML IJ SOLN
INTRAMUSCULAR | Status: AC
  Filled 2014-01-07: qty 2

## 2014-01-07 MED ORDER — TERAZOSIN HCL 2 MG PO CAPS
2.0000 mg | ORAL_CAPSULE | Freq: Every day | ORAL | Status: DC
  Administered 2014-01-07: 2 mg via ORAL
  Filled 2014-01-07 (×2): qty 1

## 2014-01-07 MED ORDER — HEMOSTATIC AGENTS (NO CHARGE) OPTIME
TOPICAL | Status: DC | PRN
Start: 2014-01-07 — End: 2014-01-07
  Administered 2014-01-07: 1 via TOPICAL

## 2014-01-07 MED ORDER — HYDROMORPHONE HCL PF 1 MG/ML IJ SOLN: 0.2500 mg | INTRAMUSCULAR | Status: DC | PRN

## 2014-01-07 MED ORDER — PHENYLEPHRINE 40 MCG/ML (10ML) SYRINGE FOR IV PUSH (FOR BLOOD PRESSURE SUPPORT)
PREFILLED_SYRINGE | INTRAVENOUS | Status: AC
  Filled 2014-01-07: qty 10

## 2014-01-07 MED ORDER — PHENYLEPHRINE HCL 10 MG/ML IJ SOLN
INTRAMUSCULAR | Status: DC | PRN
  Administered 2014-01-07: 80 ug via INTRAVENOUS

## 2014-01-07 MED ORDER — ALUM & MAG HYDROXIDE-SIMETH 200-200-20 MG/5ML PO SUSP: 30.0000 mL | Freq: Four times a day (QID) | ORAL | Status: DC | PRN

## 2014-01-07 MED ORDER — ONDANSETRON HCL 4 MG/2ML IJ SOLN
INTRAMUSCULAR | Status: DC | PRN
  Administered 2014-01-07: 4 mg via INTRAVENOUS

## 2014-01-07 MED ORDER — PANTOPRAZOLE SODIUM 40 MG PO TBEC
40.0000 mg | DELAYED_RELEASE_TABLET | Freq: Every day | ORAL | Status: DC
  Filled 2014-01-07: qty 1

## 2014-01-07 MED ORDER — OXYCODONE-ACETAMINOPHEN 5-325 MG PO TABS
1.0000 | ORAL_TABLET | ORAL | Status: DC | PRN
  Administered 2014-01-08 (×2): 2 via ORAL
  Filled 2014-01-07 (×2): qty 2

## 2014-01-07 MED ORDER — OXYCODONE HCL 5 MG/5ML PO SOLN: 5.0000 mg | Freq: Once | ORAL | Status: DC | PRN

## 2014-01-07 MED ORDER — NEOSTIGMINE METHYLSULFATE 1 MG/ML IJ SOLN
INTRAMUSCULAR | Status: AC
  Filled 2014-01-07: qty 10

## 2014-01-07 MED ORDER — ONDANSETRON HCL 4 MG/2ML IJ SOLN: 4.0000 mg | INTRAMUSCULAR | Status: DC | PRN

## 2014-01-07 MED ORDER — CEFAZOLIN SODIUM-DEXTROSE 2-3 GM-% IV SOLR
2.0000 g | Freq: Three times a day (TID) | INTRAVENOUS | Status: AC
  Administered 2014-01-07 – 2014-01-08 (×2): 2 g via INTRAVENOUS
  Filled 2014-01-07 (×2): qty 50

## 2014-01-07 MED ORDER — 0.9 % SODIUM CHLORIDE (POUR BTL) OPTIME
TOPICAL | Status: DC | PRN
  Administered 2014-01-07: 1000 mL

## 2014-01-07 MED ORDER — MIDAZOLAM HCL 5 MG/5ML IJ SOLN
INTRAMUSCULAR | Status: DC | PRN
  Administered 2014-01-07: 2 mg via INTRAVENOUS

## 2014-01-07 MED ORDER — LIDOCAINE HCL (CARDIAC) 20 MG/ML IV SOLN
INTRAVENOUS | Status: AC
  Filled 2014-01-07: qty 5

## 2014-01-07 MED ORDER — SUFENTANIL CITRATE 50 MCG/ML IV SOLN
INTRAVENOUS | Status: DC | PRN
  Administered 2014-01-07: 20 ug via INTRAVENOUS

## 2014-01-07 MED ORDER — ROCURONIUM BROMIDE 50 MG/5ML IV SOLN
INTRAVENOUS | Status: AC
  Filled 2014-01-07: qty 1

## 2014-01-07 MED ORDER — ACETAMINOPHEN 325 MG PO TABS: 650.0000 mg | ORAL_TABLET | ORAL | Status: DC | PRN

## 2014-01-07 MED ORDER — MENTHOL 3 MG MT LOZG: 1.0000 | LOZENGE | OROMUCOSAL | Status: DC | PRN

## 2014-01-07 MED ORDER — EPHEDRINE SULFATE 50 MG/ML IJ SOLN
INTRAMUSCULAR | Status: DC | PRN
  Administered 2014-01-07: 10 mg via INTRAVENOUS

## 2014-01-07 MED ORDER — ADULT MULTIVITAMIN W/MINERALS CH
1.0000 | ORAL_TABLET | Freq: Every day | ORAL | Status: DC
  Filled 2014-01-07: qty 1

## 2014-01-07 MED ORDER — SIMVASTATIN 20 MG PO TABS
20.0000 mg | ORAL_TABLET | Freq: Every day | ORAL | Status: DC
  Administered 2014-01-07: 20 mg via ORAL
  Filled 2014-01-07 (×2): qty 1

## 2014-01-07 MED ORDER — ROCURONIUM BROMIDE 100 MG/10ML IV SOLN
INTRAVENOUS | Status: DC | PRN
  Administered 2014-01-07: 50 mg via INTRAVENOUS

## 2014-01-07 MED ORDER — DIAZEPAM 5 MG PO TABS
5.0000 mg | ORAL_TABLET | Freq: Four times a day (QID) | ORAL | Status: DC | PRN
  Administered 2014-01-07 – 2014-01-08 (×2): 5 mg via ORAL
  Filled 2014-01-07 (×2): qty 1

## 2014-01-07 MED ORDER — LIDOCAINE HCL (CARDIAC) 20 MG/ML IV SOLN
INTRAVENOUS | Status: DC | PRN
  Administered 2014-01-07: 70 mg via INTRAVENOUS

## 2014-01-07 MED ORDER — MULTIVITAMINS PO CAPS: 1.0000 | ORAL_CAPSULE | Freq: Every day | ORAL | Status: DC

## 2014-01-07 MED ORDER — ONDANSETRON HCL 4 MG/2ML IJ SOLN: 4.0000 mg | Freq: Four times a day (QID) | INTRAMUSCULAR | Status: DC | PRN

## 2014-01-07 MED ORDER — BACITRACIN 50000 UNITS IM SOLR
INTRAMUSCULAR | Status: DC | PRN
  Administered 2014-01-07: 11:00:00

## 2014-01-07 MED ORDER — GLYCOPYRROLATE 0.2 MG/ML IJ SOLN
INTRAMUSCULAR | Status: AC
  Filled 2014-01-07: qty 2

## 2014-01-07 MED ORDER — DOCUSATE SODIUM 100 MG PO CAPS
100.0000 mg | ORAL_CAPSULE | Freq: Two times a day (BID) | ORAL | Status: DC
  Administered 2014-01-07: 100 mg via ORAL
  Filled 2014-01-07 (×3): qty 1

## 2014-01-07 SURGICAL SUPPLY — 62 items
APL SKNCLS STERI-STRIP NONHPOA (GAUZE/BANDAGES/DRESSINGS) ×1
BAG DECANTER FOR FLEXI CONT (MISCELLANEOUS) ×3 IMPLANT
BENZOIN TINCTURE PRP APPL 2/3 (GAUZE/BANDAGES/DRESSINGS) ×3 IMPLANT
BLADE SURG ROTATE 9660 (MISCELLANEOUS) ×2 IMPLANT
BRUSH SCRUB EZ PLAIN DRY (MISCELLANEOUS) ×3 IMPLANT
BUR ACORN 6.0 (BURR) ×2 IMPLANT
BUR ACORN 6.0MM (BURR) ×1
BUR MATCHSTICK NEURO 3.0 LAGG (BURR) ×3 IMPLANT
CANISTER SUCT 3000ML (MISCELLANEOUS) ×3 IMPLANT
CLOSURE WOUND 1/2 X4 (GAUZE/BANDAGES/DRESSINGS) ×1
CONT SPEC 4OZ CLIKSEAL STRL BL (MISCELLANEOUS) ×3 IMPLANT
DRAPE LAPAROTOMY 100X72X124 (DRAPES) ×3 IMPLANT
DRAPE MICROSCOPE LEICA (MISCELLANEOUS) ×3 IMPLANT
DRAPE POUCH INSTRU U-SHP 10X18 (DRAPES) ×3 IMPLANT
DRAPE SURG 17X23 STRL (DRAPES) ×12 IMPLANT
ELECT BLADE 4.0 EZ CLEAN MEGAD (MISCELLANEOUS) ×3
ELECT REM PT RETURN 9FT ADLT (ELECTROSURGICAL) ×3
ELECTRODE BLDE 4.0 EZ CLN MEGD (MISCELLANEOUS) ×1 IMPLANT
ELECTRODE REM PT RTRN 9FT ADLT (ELECTROSURGICAL) ×1 IMPLANT
EVACUATOR 1/8 PVC DRAIN (DRAIN) ×2 IMPLANT
GAUZE SPONGE 4X4 16PLY XRAY LF (GAUZE/BANDAGES/DRESSINGS) IMPLANT
GLOVE BIO SURGEON STRL SZ8.5 (GLOVE) ×3 IMPLANT
GLOVE BIOGEL PI IND STRL 7.0 (GLOVE) IMPLANT
GLOVE BIOGEL PI IND STRL 7.5 (GLOVE) IMPLANT
GLOVE BIOGEL PI IND STRL 8 (GLOVE) IMPLANT
GLOVE BIOGEL PI INDICATOR 7.0 (GLOVE) ×2
GLOVE BIOGEL PI INDICATOR 7.5 (GLOVE) ×2
GLOVE BIOGEL PI INDICATOR 8 (GLOVE) ×6
GLOVE ECLIPSE 7.5 STRL STRAW (GLOVE) ×8 IMPLANT
GLOVE EXAM NITRILE LRG STRL (GLOVE) IMPLANT
GLOVE EXAM NITRILE MD LF STRL (GLOVE) IMPLANT
GLOVE EXAM NITRILE XL STR (GLOVE) IMPLANT
GLOVE EXAM NITRILE XS STR PU (GLOVE) IMPLANT
GLOVE SS BIOGEL STRL SZ 7 (GLOVE) IMPLANT
GLOVE SS BIOGEL STRL SZ 8 (GLOVE) ×1 IMPLANT
GLOVE SUPERSENSE BIOGEL SZ 7 (GLOVE) ×2
GLOVE SUPERSENSE BIOGEL SZ 8 (GLOVE) ×2
GOWN BRE IMP SLV AUR LG STRL (GOWN DISPOSABLE) IMPLANT
GOWN BRE IMP SLV AUR XL STRL (GOWN DISPOSABLE) ×5 IMPLANT
GOWN STRL REIN 2XL LVL4 (GOWN DISPOSABLE) ×2 IMPLANT
KIT BASIN OR (CUSTOM PROCEDURE TRAY) ×3 IMPLANT
KIT ROOM TURNOVER OR (KITS) ×3 IMPLANT
NDL HYPO 21X1.5 SAFETY (NEEDLE) IMPLANT
NEEDLE HYPO 21X1.5 SAFETY (NEEDLE) IMPLANT
NEEDLE HYPO 22GX1.5 SAFETY (NEEDLE) ×3 IMPLANT
NS IRRIG 1000ML POUR BTL (IV SOLUTION) ×3 IMPLANT
PACK LAMINECTOMY NEURO (CUSTOM PROCEDURE TRAY) ×3 IMPLANT
PAD ARMBOARD 7.5X6 YLW CONV (MISCELLANEOUS) ×13 IMPLANT
PATTIES SURGICAL .5 X1 (DISPOSABLE) IMPLANT
RUBBERBAND STERILE (MISCELLANEOUS) ×6 IMPLANT
SPONGE GAUZE 4X4 12PLY (GAUZE/BANDAGES/DRESSINGS) ×3 IMPLANT
SPONGE SURGIFOAM ABS GEL SZ50 (HEMOSTASIS) ×3 IMPLANT
STRIP CLOSURE SKIN 1/2X4 (GAUZE/BANDAGES/DRESSINGS) ×2 IMPLANT
SUT VIC AB 1 CT1 18XBRD ANBCTR (SUTURE) ×2 IMPLANT
SUT VIC AB 1 CT1 8-18 (SUTURE) ×3
SUT VIC AB 2-0 CP2 18 (SUTURE) ×4 IMPLANT
SYR 20CC LL (SYRINGE) IMPLANT
SYR 20ML ECCENTRIC (SYRINGE) ×3 IMPLANT
TAPE CLOTH SURG 4X10 WHT LF (GAUZE/BANDAGES/DRESSINGS) ×2 IMPLANT
TOWEL OR 17X24 6PK STRL BLUE (TOWEL DISPOSABLE) ×3 IMPLANT
TOWEL OR 17X26 10 PK STRL BLUE (TOWEL DISPOSABLE) ×3 IMPLANT
WATER STERILE IRR 1000ML POUR (IV SOLUTION) ×3 IMPLANT

## 2014-01-07 NOTE — Anesthesia Postprocedure Evaluation (Signed)
Anesthesia Post Note  Patient: Kevin Matthews  Procedure(s) Performed: Procedure(s) (LRB): Lumbar three-four, Lumbar four-five redo laminectomy and foraminotomy (N/A)  Anesthesia type: General  Patient location: PACU  Post pain: Pain level controlled and Adequate analgesia  Post assessment: Post-op Vital signs reviewed, Patient's Cardiovascular Status Stable, Respiratory Function Stable, Patent Airway and Pain level controlled  Last Vitals:  Filed Vitals:   01/07/14 1400  BP:   Pulse: 57  Temp:   Resp: 13    Post vital signs: Reviewed and stable  Level of consciousness: awake, alert  and oriented  Complications: No apparent anesthesia complications

## 2014-01-07 NOTE — Progress Notes (Signed)
Subjective:  The patient is alert and pleasant. He looks well. He is in no apparent distress.  Objective: Vital signs in last 24 hours: Temp:  [97.7 F (36.5 C)-98.4 F (36.9 C)] 98.4 F (36.9 C) (03/11 1258) Pulse Rate:  [55-82] 76 (03/11 1321) Resp:  [16-20] 19 (03/11 1321) BP: (161-209)/(58-81) 161/76 mmHg (03/11 1321) SpO2:  [95 %-98 %] 98 % (03/11 1321)  Intake/Output from previous day:   Intake/Output this shift: Total I/O In: 1000 [I.V.:1000] Out: 50 [Blood:50]  Physical exam the patient is alert and pleasant. He is moving all 4 extremities well.  Lab Results: No results found for this basename: WBC, HGB, HCT, PLT,  in the last 72 hours BMET No results found for this basename: NA, K, CL, CO2, GLUCOSE, BUN, CREATININE, CALCIUM,  in the last 72 hours  Studies/Results: Dg Lumbar Spine 1 View  01/07/2014   CLINICAL DATA L3-4, L4-5 laminectomy.  EXAM LUMBAR SPINE - 1 VIEW  COMPARISON None.  FINDINGS Single lateral intraoperative image demonstrates posterior instruments directed at the L3-4 level.  IMPRESSION Intraoperative localization as above.  SIGNATURE  Electronically Signed   By: Rolm Baptise M.D.   On: 01/07/2014 12:20    Assessment/Plan: The patient is doing well. I spoke with his family.  LOS: 0 days     Addie Alonge D 01/07/2014, 1:29 PM

## 2014-01-07 NOTE — Anesthesia Preprocedure Evaluation (Addendum)
Anesthesia Evaluation  Patient identified by MRN, date of birth, ID band Patient awake    Reviewed: Allergy & Precautions, H&P , NPO status , Patient's Chart, lab work & pertinent test results  Airway Mallampati: III TM Distance: >3 FB Neck ROM: full    Dental  (+) Teeth Intact   Pulmonary former smoker,          Cardiovascular + Valvular Problems/Murmurs     Neuro/Psych    GI/Hepatic GERD-  ,  Endo/Other    Renal/GU      Musculoskeletal  (+) Arthritis -,   Abdominal   Peds  Hematology   Anesthesia Other Findings   Reproductive/Obstetrics                          Anesthesia Physical Anesthesia Plan  ASA: II  Anesthesia Plan: General   Post-op Pain Management:    Induction: Intravenous  Airway Management Planned: Oral ETT  Additional Equipment: None  Intra-op Plan:   Post-operative Plan: Extubation in OR  Informed Consent: I have reviewed the patients History and Physical, chart, labs and discussed the procedure including the risks, benefits and alternatives for the proposed anesthesia with the patient or authorized representative who has indicated his/her understanding and acceptance.   Dental advisory given  Plan Discussed with: CRNA, Anesthesiologist and Surgeon  Anesthesia Plan Comments:        Anesthesia Quick Evaluation

## 2014-01-07 NOTE — Op Note (Signed)
Brief history: The patient is a 80 year old white male who's had prior troubles with his back. Infectious had multiple previous surgeries. The last of which was done at the [place] in [place] approximately one year ago. The patient has had persistent, back, buttock, and leg pain consistent with neurogenic claudication. He has failed medical management and was worked up with a lumbar myelo CT. This demonstrated the patient has spinal stenosis at L23, L3-4 and L4-5. I discussed the various treatment options with the patient including surgery. He has weighed the risks, benefits, and alternatives surgery and decided proceed with a redo decompressive laminectomy.  Preoperative diagnosis: L2-3, L3-4 and L4-5 spinal stenosis, lumbago, lumbar radiculopathy, neurogenic claudication  Postoperative diagnosis: The same  Procedure: Redo L4,and L3 laminectomy with bilateral L2 laminotomies to decompress the bilateral L3, L4 and L5 nerve roots using microdissection   Surgeon: Dr. Earle Gell  Asst.: Dr. Jovita Gamma  Anesthesia: Gen. endotracheal  Estimated blood loss: 75 cc  Drains: One epidural medium Hemovac drain  Complications: None  Description of procedure: The patient was brought to the operating room by the anesthesia team. General endotracheal anesthesia was induced. The patient was turned to the prone position on the Wilson frame. The patient's lumbosacral region was then prepared with Betadine scrub and Betadine solution. Sterile drapes were applied.  I then injected the area to be incised with Marcaine with epinephrine solution. I then used a scalpel to make a linear midline incision over the L2-3, L3-4 and L4-5 intervertebral disc space. I then used electrocautery to perform a bilateral subperiosteal dissection exposing the spinous process and lamina of L2 and L3 as well as exposing the remainder of the L4 lamina.. We obtained intraoperative radiograph to confirm our location. I then inserted the  [facility] retractor for exposure. I then used a scalpel to incise the interspinous ligament at L2-3 and L3-4. I then used the Leksell 1 sure to remove the spinous process of L3, removed the caudal aspect of the L2 spinous process, and to remove part of the remainder of the L4 lamina.  We then brought the operative microscope into the field. Under its magnification and illumination we completed the microdissection. I used a high-speed drill to perform a laminotomy at L2, L3, and L4 bilaterally. We encountered epidural scar tissue at L4-5 as expected. I then used a Kerrison punches to widen complete the laminectomy at L3 and L4, and to widen the laminotomies at L2. the laminotomy and removed the ligamentum flavum at L2-3, L3-4 and the remainder of the ligamentum flavum at L4-5. We then used microdissection to free up the thecal sac from the epidural tissue and ligamentum flavum at L34 and L2-3 and scar tissue at L4-5 and the bilateral L3, L4 and L5 nerve root from the epidural tissue. I then used a Kerrison punch to perform a foraminotomy at about the bilateral L3, L4 and L5 nerve root. We inspected the intervertebral disc at L3-4 and L4-5. There were no herniations.  I then palpated along the ventral surface of the thecal sac and along exit route of the lateral L3, L4 and L5 nerve root and noted that the neural structures were well decompressed. This completed the decompression.  We then obtained hemostasis using bipolar electrocautery. We irrigated the wound out with bacitracin solution. We then removed the retractor. I placed a medium Hemovac drain in the epidural space and tunneled it out through separate stab wound. We then reapproximated the patient's thoracolumbar fascia with interrupted #1 Vicryl suture.  We then reapproximated the patient's subcutaneous tissue with interrupted 2-0 Vicryl suture. We then reapproximated patient's skin with Steri-Strips and benzoin. The was then coated with bacitracin  ointment. The drapes were removed. The patient was subsequently returned to the supine position where they were extubated by the anesthesia team. The patient was then transported to the postanesthesia care unit in stable condition. All sponge instrument and needle counts were reportedly correct at the end of this case.

## 2014-01-07 NOTE — H&P (Signed)
Subjective: The patient is a 80 year old white male who's had prior troubles with his back. I think he's had a lumbar laminectomy at the [place] in [place] in January 2014 . The patient has had persistent back and leg pain. He has failed medical management and was worked up with a lumbar myelo CT. This demonstrated spinal stenosis at L3-4 and L4-5. I discussed surgery with the patient. He has decided proceed with the operation.  Past Medical History  Diagnosis Date  . GERD (gastroesophageal reflux disease)   . Arthritis   . Heart murmur     dr Feliz Beam    Past Surgical History  Procedure Laterality Date  . Vascular surgery    . Carotid endarterectomy    . Back surgery      x4  . Tonsillectomy      No Known Allergies  History  Substance Use Topics  . Smoking status: Former Research scientist (life sciences)  . Smokeless tobacco: Not on file  . Alcohol Use: Yes     Comment: weekly    History reviewed. No pertinent family history. Prior to Admission medications   Medication Sig Start Date End Date Taking? Authorizing Provider  Alpha Lipoic Acid 200 MG CAPS Take 200 mg by mouth daily.   Yes Historical Provider, MD  aspirin 325 MG tablet Take 325 mg by mouth daily.   Yes Historical Provider, MD  calcium carbonate (TUMS EX) 750 MG chewable tablet Chew 3 tablets by mouth as needed for heartburn.   Yes Historical Provider, MD  docusate sodium (COLACE) 100 MG capsule Take 100 mg by mouth 2 (two) times daily.   Yes Historical Provider, MD  gabapentin (NEURONTIN) 300 MG capsule Take 600 mg by mouth 3 (three) times daily.   Yes Historical Provider, MD  ibuprofen (ADVIL,MOTRIN) 200 MG tablet Take 200 mg by mouth daily as needed for moderate pain.   Yes Historical Provider, MD  Multiple Vitamin (MULTIVITAMIN) capsule Take 1 capsule by mouth daily.   Yes Historical Provider, MD  omeprazole (PRILOSEC) 20 MG capsule Take 20 mg by mouth daily.   Yes Historical Provider, MD  pravastatin (PRAVACHOL) 40 MG tablet Take 80 mg  by mouth daily.   Yes Historical Provider, MD  terazosin (HYTRIN) 2 MG capsule Take 2 mg by mouth at bedtime.   Yes Historical Provider, MD     Review of Systems  Positive ROS: As above  All other systems have been reviewed and were otherwise negative with the exception of those mentioned in the HPI and as above.  Objective: Vital signs in last 24 hours: Temp:  [97.7 F (36.5 C)] 97.7 F (36.5 C) (03/11 0801) Pulse Rate:  [55] 55 (03/11 0801) Resp:  [20] 20 (03/11 0801) BP: (192-209)/(77-81) 192/77 mmHg (03/11 0813) SpO2:  [95 %] 95 % (03/11 0801)  General Appearance: Alert, cooperative, no distress, Head: Normocephalic, without obvious abnormality, atraumatic Eyes: PERRL, conjunctiva/corneas clear, EOM's intact,    Ears: Normal  Throat: Normal  Neck: Supple, symmetrical, trachea midline, no adenopathy; thyroid: No enlargement/tenderness/nodules; no carotid bruit or JVD Back: Symmetric, no curvature, ROM normal, no CVA tenderness. The patient's lumbar incision is well-healed. Lungs: Clear to auscultation bilaterally, respirations unlabored Heart: Regular rate and rhythm, no murmur, rub or gallop Abdomen: Soft, non-tender,, no masses, no organomegaly Extremities: Extremities normal, atraumatic, no cyanosis or edema Pulses: 2+ and symmetric all extremities Skin: Skin color, texture, turgor normal, no rashes or lesions  NEUROLOGIC:   Mental status: alert and  oriented, no aphasia, good attention span, Fund of knowledge/ memory ok Motor Exam - grossly normal Sensory Exam - grossly normal Reflexes:  Coordination - grossly normal Gait - grossly normal Balance - grossly normal Cranial Nerves: I: smell Not tested  II: visual acuity  OS: Normal  OD: Normal   II: visual fields Full to confrontation  II: pupils Equal, round, reactive to light  III,VII: ptosis None  III,IV,VI: extraocular muscles  Full ROM  V: mastication Normal  V: facial light touch sensation  Normal  V,VII:  corneal reflex  Present  VII: facial muscle function - upper  Normal  VII: facial muscle function - lower Normal  VIII: hearing Not tested  IX: soft palate elevation  Normal  IX,X: gag reflex Present  XI: trapezius strength  5/5  XI: sternocleidomastoid strength 5/5  XI: neck flexion strength  5/5  XII: tongue strength  Normal    Data Review Lab Results  Component Value Date   WBC 14.6* 01/01/2014   HGB 12.8* 01/01/2014   HCT 37.8* 01/01/2014   MCV 89.6 01/01/2014   PLT 229 01/01/2014   Lab Results  Component Value Date   NA 138 01/01/2014   K 4.1 01/01/2014   CL 100 01/01/2014   CO2 26 01/01/2014   BUN 17 01/01/2014   CREATININE 1.12 01/01/2014   GLUCOSE 111* 01/01/2014   No results found for this basename: INR, PROTIME    Assessment/Plan: L3-4 and L4-5 spinal stenosis, lumbago, lumbar radiculopathy, neurogenic claudication: I discussed situation with the patient. I reviewed his myelo CT with them and pointed out the abnormalities. We have discussed the various treatment options including surgery. I described the surgical treatment option of a redo L3-4 and L4-5 laminectomy. I have shown him surgical models. We have discussed the risks, benefits, alternatives, and likelihood of achieving our goals with surgery. I've answered all the patient's questions. He has decided to proceed with surgery.   Atwood Adcock D 01/07/2014 10:22 AM

## 2014-01-07 NOTE — Transfer of Care (Signed)
Immediate Anesthesia Transfer of Care Note  Patient: Kevin Matthews  Procedure(s) Performed: Procedure(s) with comments: Lumbar three-four, Lumbar four-five redo laminectomy and foraminotomy (N/A) - Lumbar three-four, Lumbar four-five redo laminectomy and foraminotomy  Patient Location: PACU  Anesthesia Type:General  Level of Consciousness: awake  Airway & Oxygen Therapy: Patient Spontanous Breathing and Patient connected to nasal cannula oxygen  Post-op Assessment: Report given to PACU RN, Post -op Vital signs reviewed and stable and Patient moving all extremities  Post vital signs: Reviewed and stable  Complications: No apparent anesthesia complications

## 2014-01-07 NOTE — Anesthesia Procedure Notes (Signed)
Procedure Name: Intubation Date/Time: 01/07/2014 10:51 AM Performed by: Melina Copa, Jamal Haskin R Pre-anesthesia Checklist: Patient identified, Emergency Drugs available, Suction available, Patient being monitored and Timeout performed Patient Re-evaluated:Patient Re-evaluated prior to inductionOxygen Delivery Method: Circle system utilized Preoxygenation: Pre-oxygenation with 100% oxygen Intubation Type: IV induction Ventilation: Mask ventilation without difficulty Laryngoscope Size: Mac Grade View: Grade II Tube type: Oral Tube size: 8.0 mm Number of attempts: 1 Airway Equipment and Method: Stylet Placement Confirmation: ETT inserted through vocal cords under direct vision and positive ETCO2 Secured at: 22 cm Tube secured with: Tape Dental Injury: Teeth and Oropharynx as per pre-operative assessment

## 2014-01-07 NOTE — Preoperative (Signed)
Beta Blockers   Reason not to administer Beta Blockers:Not Applicable 

## 2014-01-08 ENCOUNTER — Encounter (HOSPITAL_COMMUNITY): Payer: Self-pay | Admitting: Neurosurgery

## 2014-01-08 MED ORDER — OXYCODONE-ACETAMINOPHEN 5-325 MG PO TABS: 1.0000 | ORAL_TABLET | ORAL | Status: DC | PRN

## 2014-01-08 MED ORDER — DSS 100 MG PO CAPS: 100.0000 mg | ORAL_CAPSULE | Freq: Two times a day (BID) | ORAL | Status: DC

## 2014-01-08 MED ORDER — DIAZEPAM 5 MG PO TABS: 5.0000 mg | ORAL_TABLET | Freq: Four times a day (QID) | ORAL | Status: DC | PRN

## 2014-01-08 NOTE — Discharge Summary (Signed)
Physician Discharge Summary  Patient ID: Kevin Matthews MRN: [id number] DOB/AGE: Mar 07, 1934 80 y.o.  Admit date: 01/07/2014 Discharge date: 01/08/2014  Admission Diagnoses: L2-3, L3-4 and L4-5 spinal stenosis, lumbago, lumbar radiculopathy, neurogenic claudication  Discharge Diagnoses: The same Active Problems:   Lumbar stenosis with neurogenic claudication   Discharged Condition: good  Hospital Course: I performed a redo L2-3 t, L3-4, and L4-5 laminectomy on the patient on 01/07/2014. The surgery went well.  The patient's postoperative course was unremarkable. On postop day #1 he requested discharge to home. He was given oral and written discharge instructions. All his, and his wife's, questions were answered.  Consults: None Significant Diagnostic Studies: None Treatments: Redo L2-3, L3-4 and L4-5 laminectomy to decompress the bilateral L3, L4 and L5 nerve roots using microdissection Discharge Exam: Blood pressure 99/60, pulse 78, temperature 99.2 F (37.3 C), temperature source Oral, resp. rate 16, SpO2 93.00%. Patient is alert and pleasant. His strength is normal his lower extremities. His dressing is clean and dry. He looks well.  Disposition: Home  Discharge Orders   Future Orders Complete By Expires   Call MD for:  difficulty breathing, headache or visual disturbances  As directed    Call MD for:  extreme fatigue  As directed    Call MD for:  hives  As directed    Call MD for:  persistant dizziness or light-headedness  As directed    Call MD for:  persistant nausea and vomiting  As directed    Call MD for:  redness, tenderness, or signs of infection (pain, swelling, redness, odor or green/yellow discharge around incision site)  As directed    Call MD for:  severe uncontrolled pain  As directed    Call MD for:  temperature >100.4  As directed    Diet - low sodium heart healthy  As directed    Discharge instructions  As directed    Comments:     Call [phone]  for a followup appointment. Take a stool softener while you are using pain medications.   Driving Restrictions  As directed    Comments:     Do not drive for 2 weeks.   Increase activity slowly  As directed    Lifting restrictions  As directed    Comments:     Do not lift more than 5 pounds. No excessive bending or twisting.   May shower / Bathe  As directed    Comments:     He may shower after the pain she is removed 3 days after surgery. Leave the incision alone.   Remove dressing in 48 hours  As directed    Comments:     Your stitches are under the scan and will dissolve by themselves. The Steri-Strips will fall off after you take a few showers. Do not rub back or pick at the wound, Leave the wound alone.       Medication List         Alpha Lipoic Acid 200 MG Caps  Take 200 mg by mouth daily.     aspirin 325 MG tablet  Take 325 mg by mouth daily.     calcium carbonate 750 MG chewable tablet  Commonly known as:  TUMS EX  Chew 3 tablets by mouth as needed for heartburn.     diazepam 5 MG tablet  Commonly known as:  VALIUM  Take 1 tablet (5 mg total) by mouth every 6 (six) hours as needed for muscle spasms.  docusate sodium 100 MG capsule  Commonly known as:  COLACE  Take 100 mg by mouth 2 (two) times daily.     DSS 100 MG Caps  Take 100 mg by mouth 2 (two) times daily.     gabapentin 300 MG capsule  Commonly known as:  NEURONTIN  Take 600 mg by mouth 3 (three) times daily.     ibuprofen 200 MG tablet  Commonly known as:  ADVIL,MOTRIN  Take 200 mg by mouth daily as needed for moderate pain.     multivitamin capsule  Take 1 capsule by mouth daily.     omeprazole 20 MG capsule  Commonly known as:  PRILOSEC  Take 20 mg by mouth daily.     oxyCODONE-acetaminophen 5-325 MG per tablet  Commonly known as:  PERCOCET/ROXICET  Take 1-2 tablets by mouth every 4 (four) hours as needed for moderate pain.     pravastatin 40 MG tablet  Commonly known as:  PRAVACHOL   Take 80 mg by mouth daily.     terazosin 2 MG capsule  Commonly known as:  HYTRIN  Take 2 mg by mouth at bedtime.         SignedNewman Pies D 01/08/2014, 8:00 AM

## 2014-01-08 NOTE — Progress Notes (Signed)
PT Cancellation Note  Patient Details Name: Kevin Matthews MRN: [id number] DOB: January 27, 1934   Cancelled Treatment:    Reason Eval/Treat Not Completed: Fatigue/lethargy limiting ability to participate; patient up through the night to toilet and got nauseated/presyncopal last time up to bathroom.  Wishes to wait till after eating to work with PT.  Will return.   WYNN,CYNDI 01/08/2014, 9:04 AM

## 2014-01-08 NOTE — Progress Notes (Signed)
Discharge instructions given and explained to patient and wife, no complaints noted. Patient/wife verbalized understanding.

## 2014-01-08 NOTE — Evaluation (Addendum)
Physical Therapy Evaluation Patient Details Name: Kevin Matthews MRN: [id number] DOB: December 29, 1933 Today's Date: 01/08/2014 Time: 4098-1191 PT Time Calculation (min): 26 min  PT Assessment / Plan / Recommendation History of Present Illness  Patienti s a 80 y/o male who's had prior troubles with his back. I think he's had a lumbar laminectomy at the [facility] in [place] in January 2014 . The patient has had persistent back and leg pain. He has failed medical management and was worked up with a lumbar myelo CT. This demonstrated spinal stenosis at L3-4 and L4-5. Now e/p L2-3, L3-L4, L4-5 laminectomy.  Clinical Impression  Patient presents with decreased independence with mobility due to deficits listed below (PT problem list).  He will benefit from skilled PT in the acute setting to allow maximal independence prior to d/c.  Noted planned d/c for today.  Mobility shouldn't hold his discharge due to wife able to assist and has been assisting him up all night, but noted orthostatic hypotension with c/o nausea with mobility.  Reports had this prior to surgery.  Discussed with RN.  Evidently pt eager for d/c.  Recommend HHPT if d/c home today.    PT Assessment  Patient needs continued PT services    Follow Up Recommendations  Home health PT;Supervision/Assistance - 24 hour    Does the patient have the potential to tolerate intense rehabilitation    N/A  Barriers to Discharge  None      Equipment Recommendations  None recommended by PT    Recommendations for Other Services     Frequency Min 5X/week    Precautions / Restrictions Precautions Precautions: Fall;Back Precaution Booklet Issued: Yes (comment)   Pertinent Vitals/Pain Mod c/o back pain with supine to sit; BP supine 126/69; sitting 111/57; standing 99/54      Mobility  Bed Mobility Overal bed mobility: Needs Assistance Bed Mobility: Sidelying to Sit Sidelying to sit: Min assist General bed mobility comments: assist with  rail Transfers Overall transfer level: Needs assistance Equipment used: Rolling walker (2 wheeled) Transfers: Sit to/from Stand Sit to Stand: Min guard General transfer comment: assist up from bed, demonstrates safe technique Ambulation/Gait Ambulation/Gait assistance: Supervision;Min guard Ambulation Distance (Feet): 80 Feet Assistive device: Rolling walker (2 wheeled) Gait Pattern/deviations: Step-through pattern;Decreased stance time - left;Decreased stride length;Antalgic General Gait Details: reports has been limping on left leg for years, states numbness in leg seems worse and c/o imbalance    Exercises Other Exercises Other Exercises: Reviewed back precautions with patient and importance of frequent short walks with walker throughout the day.   PT Diagnosis: Abnormality of gait;Acute pain  PT Problem List: Decreased strength;Decreased activity tolerance;Decreased mobility;Decreased balance;Decreased knowledge of precautions PT Treatment Interventions: DME instruction;Functional mobility training;Balance training;Gait training;Stair training;Therapeutic exercise;Therapeutic activities     PT Goals(Current goals can be found in the care plan section) Acute Rehab PT Goals Patient Stated Goal: To go home today PT Goal Formulation: With patient/family Time For Goal Achievement: 01/15/14 Potential to Achieve Goals: Good  Visit Information  Last PT Received On: 01/08/14 Assistance Needed: +1 Reason Eval/Treat Not Completed: Fatigue/lethargy limiting ability to participate History of Present Illness: Patienti s a 80 y/o male who's had prior troubles with his back. I think he's had a lumbar laminectomy at the [facility] in [place] in January 2014 . The patient has had persistent back and leg pain. He has failed medical management and was worked up with a lumbar myelo CT. This demonstrated spinal stenosis at L3-4 and L4-5. Now e/p L2-3,  L3-L4, L4-5 laminectomy.       Prior [place] expects to be discharged to:: Private residence Living Arrangements: Spouse/significant other Available Help at Discharge: Family Type of Home: House Home Access: Stairs to enter Technical brewer of Steps: 2 Entrance Stairs-Rails: None Home Layout: One level Home Equipment: Environmental consultant - 2 wheels;Cane - single point;Shower seat Prior Function Level of Independence: Independent with assistive device(s) Comments: used cane when going out prior to surgery, wife assist with socks/shoes Communication Communication: No difficulties    Cognition  Cognition Arousal/Alertness: Awake/alert Behavior During Therapy: WFL for tasks assessed/performed Overall Cognitive Status: Within Functional Limits for tasks assessed    Extremity/Trunk Assessment Lower Extremity Assessment Lower Extremity Assessment: RLE deficits/detail;LLE deficits/detail RLE Deficits / Details: WFL LLE Deficits / Details: AROM WFL, strength hip flexion 3-/5, knee ext 4/5, ankle DF 4+/5 LLE Sensation: decreased light touch   Balance Balance Overall balance assessment: Needs assistance Standing balance support: Bilateral upper extremity supported Standing balance-Leahy Scale: Poor Standing balance comment: needs UE support for balance  End of Session PT - End of Session Equipment Utilized During Treatment: Gait belt Activity Tolerance: Treatment limited secondary to medical complications (Comment) (nausea and light headed) Patient left: in bed;with call bell/phone within reach;with family/visitor present Nurse Communication: Mobility status  GP Functional Assessment Tool Used: Clinical Judgement Functional Limitation: Mobility: Walking and moving around Mobility: Walking and Moving Around Current Status ([id number]): At least 20 percent but less than 40 percent impaired, limited or restricted Mobility: Walking and Moving Around Goal Status [phone]): At least 1 percent but less than 20 percent impaired,  limited or restricted   [facility] 01/08/2014, 11:31 AM Magda Kiel, PT [phone] 01/08/2014

## 2014-01-13 ENCOUNTER — Emergency Department (HOSPITAL_COMMUNITY): Payer: Medicare Other

## 2014-01-13 ENCOUNTER — Encounter (HOSPITAL_COMMUNITY): Payer: Self-pay | Admitting: Emergency Medicine

## 2014-01-13 ENCOUNTER — Inpatient Hospital Stay (HOSPITAL_COMMUNITY)
Admission: EM | Admit: 2014-01-13 | Discharge: 2014-01-15 | DRG: 091 | Disposition: A | Discharge status: Home Health | Payer: Medicare Other | Attending: Family Medicine | Admitting: Family Medicine

## 2014-01-13 DIAGNOSIS — T40605A Adverse effect of unspecified narcotics, initial encounter: Secondary | ICD-10-CM | POA: Diagnosis present

## 2014-01-13 DIAGNOSIS — M129 Arthropathy, unspecified: Secondary | ICD-10-CM | POA: Diagnosis present

## 2014-01-13 DIAGNOSIS — K219 Gastro-esophageal reflux disease without esophagitis: Secondary | ICD-10-CM | POA: Diagnosis present

## 2014-01-13 DIAGNOSIS — Z79899 Other long term (current) drug therapy: Secondary | ICD-10-CM

## 2014-01-13 DIAGNOSIS — N179 Acute kidney failure, unspecified: Secondary | ICD-10-CM | POA: Diagnosis present

## 2014-01-13 DIAGNOSIS — T424X5A Adverse effect of benzodiazepines, initial encounter: Secondary | ICD-10-CM | POA: Diagnosis present

## 2014-01-13 DIAGNOSIS — G92 Toxic encephalopathy: Principal | ICD-10-CM | POA: Diagnosis present

## 2014-01-13 DIAGNOSIS — M545 Low back pain, unspecified: Secondary | ICD-10-CM | POA: Diagnosis present

## 2014-01-13 DIAGNOSIS — M48062 Spinal stenosis, lumbar region with neurogenic claudication: Secondary | ICD-10-CM | POA: Diagnosis present

## 2014-01-13 DIAGNOSIS — Z87891 Personal history of nicotine dependence: Secondary | ICD-10-CM

## 2014-01-13 DIAGNOSIS — G929 Unspecified toxic encephalopathy: Principal | ICD-10-CM | POA: Diagnosis present

## 2014-01-13 DIAGNOSIS — J189 Pneumonia, unspecified organism: Secondary | ICD-10-CM | POA: Diagnosis present

## 2014-01-13 DIAGNOSIS — E860 Dehydration: Secondary | ICD-10-CM | POA: Diagnosis present

## 2014-01-13 DIAGNOSIS — R4182 Altered mental status, unspecified: Secondary | ICD-10-CM

## 2014-01-13 LAB — COMPREHENSIVE METABOLIC PANEL
ALK PHOS: 64 U/L (ref 39–117)
ALT: 18 U/L (ref 0–53)
AST: 33 U/L (ref 0–37)
Albumin: 2.9 g/dL — ABNORMAL LOW (ref 3.5–5.2)
BILIRUBIN TOTAL: 0.8 mg/dL (ref 0.3–1.2)
BUN: 22 mg/dL (ref 6–23)
CHLORIDE: 99 meq/L (ref 96–112)
CO2: 29 mEq/L (ref 19–32)
CREATININE: 1.41 mg/dL — AB (ref 0.50–1.35)
Calcium: 9.1 mg/dL (ref 8.4–10.5)
GFR calc Af Amer: 54 mL/min — ABNORMAL LOW (ref >90)
GFR calc non Af Amer: 47 mL/min — ABNORMAL LOW (ref >90)
Glucose, Bld: 116 mg/dL — ABNORMAL HIGH (ref 70–99)
Potassium: 3.9 mEq/L (ref 3.7–5.3)
Sodium: 139 mEq/L (ref 137–147)
Total Protein: 6.8 g/dL (ref 6.0–8.3)

## 2014-01-13 LAB — SALICYLATE LEVEL: Salicylate Lvl: <2 mg/dL — ABNORMAL LOW (ref 2.8–20.0)

## 2014-01-13 LAB — CBC WITH DIFFERENTIAL/PLATELET
BASOS ABS: 0 10*3/uL (ref 0.0–0.1)
Basophils Relative: 0 % (ref 0–1)
Eosinophils Absolute: 0.1 10*3/uL (ref 0.0–0.7)
Eosinophils Relative: 1 % (ref 0–5)
HEMATOCRIT: 32.9 % — AB (ref 39.0–52.0)
HEMOGLOBIN: 11.3 g/dL — AB (ref 13.0–17.0)
LYMPHS PCT: 8 % — AB (ref 12–46)
Lymphs Abs: 1.1 10*3/uL (ref 0.7–4.0)
MCH: 30.8 pg (ref 26.0–34.0)
MCHC: 34.3 g/dL (ref 30.0–36.0)
MCV: 89.6 fL (ref 78.0–100.0)
MONOS PCT: 7 % (ref 3–12)
Monocytes Absolute: 1 10*3/uL (ref 0.1–1.0)
NEUTROS ABS: 12 10*3/uL — AB (ref 1.7–7.7)
Neutrophils Relative %: 85 % — ABNORMAL HIGH (ref 43–77)
Platelets: 210 10*3/uL (ref 150–400)
RBC: 3.67 MIL/uL — ABNORMAL LOW (ref 4.22–5.81)
RDW: 12.9 % (ref 11.5–15.5)
WBC: 14.2 10*3/uL — AB (ref 4.0–10.5)

## 2014-01-13 LAB — URINALYSIS, ROUTINE W REFLEX MICROSCOPIC
BILIRUBIN URINE: NEGATIVE
GLUCOSE, UA: NEGATIVE mg/dL
HGB URINE DIPSTICK: NEGATIVE
KETONES UR: NEGATIVE mg/dL
Leukocytes, UA: NEGATIVE
Nitrite: NEGATIVE
PROTEIN: NEGATIVE mg/dL
Specific Gravity, Urine: 1.025 (ref 1.005–1.030)
UROBILINOGEN UA: 0.2 mg/dL (ref 0.0–1.0)
pH: 5.5 (ref 5.0–8.0)

## 2014-01-13 LAB — ACETAMINOPHEN LEVEL

## 2014-01-13 LAB — I-STAT CG4 LACTIC ACID, ED: Lactic Acid, Venous: 1.52 mmol/L (ref 0.5–2.2)

## 2014-01-13 LAB — RAPID URINE DRUG SCREEN, HOSP PERFORMED
AMPHETAMINES: NOT DETECTED
BARBITURATES: NOT DETECTED
BENZODIAZEPINES: POSITIVE — AB
COCAINE: NOT DETECTED
Opiates: NOT DETECTED
TETRAHYDROCANNABINOL: NOT DETECTED

## 2014-01-13 LAB — ETHANOL: Alcohol, Ethyl (B): <11 mg/dL (ref 0–11)

## 2014-01-13 MED ORDER — DEXTROSE 5 % IV SOLN
1.0000 g | Freq: Once | INTRAVENOUS | Status: AC
  Administered 2014-01-13: 1 g via INTRAVENOUS
  Filled 2014-01-13: qty 10

## 2014-01-13 MED ORDER — HEPARIN SODIUM (PORCINE) 5000 UNIT/ML IJ SOLN
5000.0000 [IU] | Freq: Three times a day (TID) | INTRAMUSCULAR | Status: DC
  Administered 2014-01-13 – 2014-01-15 (×5): 5000 [IU] via SUBCUTANEOUS
  Filled 2014-01-13 (×5): qty 1

## 2014-01-13 MED ORDER — SODIUM CHLORIDE 0.9 % IV SOLN
Freq: Once | INTRAVENOUS | Status: AC
Start: 2014-01-13 — End: 2014-01-13
  Administered 2014-01-13: 12:00:00 via INTRAVENOUS

## 2014-01-13 MED ORDER — DEXTROSE 5 % IV SOLN
1.0000 g | INTRAVENOUS | Status: DC
  Administered 2014-01-14: 1 g via INTRAVENOUS
  Filled 2014-01-13 (×2): qty 10

## 2014-01-13 MED ORDER — ONDANSETRON HCL 4 MG PO TABS: 4.0000 mg | ORAL_TABLET | Freq: Four times a day (QID) | ORAL | Status: DC | PRN

## 2014-01-13 MED ORDER — SODIUM CHLORIDE 0.9 % IV SOLN
INTRAVENOUS | Status: DC
  Administered 2014-01-13 – 2014-01-15 (×4): via INTRAVENOUS

## 2014-01-13 MED ORDER — SODIUM CHLORIDE 0.9 % IV BOLUS (SEPSIS)
1000.0000 mL | Freq: Once | INTRAVENOUS | Status: AC
  Administered 2014-01-13: 1000 mL via INTRAVENOUS

## 2014-01-13 MED ORDER — ONDANSETRON HCL 4 MG/2ML IJ SOLN: 4.0000 mg | Freq: Four times a day (QID) | INTRAMUSCULAR | Status: DC | PRN

## 2014-01-13 MED ORDER — NALOXONE HCL 0.4 MG/ML IJ SOLN
0.4000 mg | Freq: Once | INTRAMUSCULAR | Status: AC
  Administered 2014-01-13: 0.4 mg via INTRAVENOUS
  Filled 2014-01-13: qty 1

## 2014-01-13 MED ORDER — ACETAMINOPHEN 500 MG PO TABS
500.0000 mg | ORAL_TABLET | ORAL | Status: DC | PRN
  Administered 2014-01-13 – 2014-01-15 (×2): 500 mg via ORAL
  Filled 2014-01-13 (×2): qty 1

## 2014-01-13 MED ORDER — TRAMADOL HCL 50 MG PO TABS
50.0000 mg | ORAL_TABLET | Freq: Four times a day (QID) | ORAL | Status: DC | PRN
  Administered 2014-01-13 – 2014-01-14 (×2): 50 mg via ORAL
  Filled 2014-01-13 (×2): qty 1

## 2014-01-13 MED ORDER — DEXTROSE 5 % IV SOLN
500.0000 mg | INTRAVENOUS | Status: DC
  Administered 2014-01-14: 500 mg via INTRAVENOUS
  Filled 2014-01-13 (×3): qty 500

## 2014-01-13 MED ORDER — DEXTROSE 5 % IV SOLN
500.0000 mg | Freq: Once | INTRAVENOUS | Status: AC
  Administered 2014-01-13: 500 mg via INTRAVENOUS

## 2014-01-13 NOTE — ED Notes (Signed)
cbg 98 per ems 

## 2014-01-13 NOTE — ED Notes (Signed)
EMS reports pt had lower back surgery last week.  EMS reports was initially called out for unconsciousness.  Family reports has been lethargic and difficult to arouse x 24  Hours.  Pt taking oxycodone for pain, last dose was 4am this morning.  Is prescribed every 4 hours.  EMS reports pt needed to get up to go to the bathroom but was unable to get up due to generalized weakness.  Reports pt became nauseated and had near syncopal episode while trying to walk with walker for ems.  BP 77/50.  EMS reports pt has to "drag" left leg.  Reports left sided weakness is not new.  Room air 02 sat 88% per [employer]. Pt says feels weak all over but mostly in legs.

## 2014-01-13 NOTE — ED Provider Notes (Signed)
CSN: [id number]     Arrival date & time 01/13/14  1117 History  This chart was scribed for Sharyon Cable, MD by Ludger Nutting, ED Scribe. This patient was seen in room APA05/APA05 and the patient's care was started 11:37 AM.    Chief Complaint  Patient presents with  . Weakness      HPI  HPI Comments: Kevin Matthews is a 80 y.o. male brought in by ambulance, who presents to the Emergency Department complaining of constant generalized weakness to the upper and lower extremities that began this morning. He reports having lower back surgery in [facility] by Dr. Arnoldo Morale. Pt states he has been taking oxycodone for pain every 4 hours; last dose was 4am. Per EMS, pt had a near syncopal episode when his pressure dropped to the 70's. EMS states pt had a left leg drag when they arrived at his home but this is baseline. He denies fever, vomiting, HA, back pain, cough.    Past Medical History  Diagnosis Date  . GERD (gastroesophageal reflux disease)   . Arthritis   . Heart murmur     dr Feliz Beam   Past Surgical History  Procedure Laterality Date  . Vascular surgery    . Carotid endarterectomy    . Back surgery      x4  . Tonsillectomy    . Lumbar laminectomy/decompression microdiscectomy N/A 01/07/2014    Procedure: Lumbar three-four, Lumbar four-five redo laminectomy and foraminotomy;  Surgeon: Ophelia Charter, MD;  Location: [facility];  Service: Neurosurgery;  Laterality: N/A;  Lumbar three-four, Lumbar four-five redo laminectomy and foraminotomy   No family history on file. History  Substance Use Topics  . Smoking status: Former Research scientist (life sciences)  . Smokeless tobacco: Not on file  . Alcohol Use: Yes     Comment: weekly    Review of Systems  Unable to perform ROS: Mental status change      Allergies  Review of patient's allergies indicates no known allergies.  Home Medications   Current Outpatient Rx  Name  Route  Sig  Dispense  Refill  . Alpha Lipoic Acid 200 MG  CAPS   Oral   Take 200 mg by mouth daily.         Marland Kitchen aspirin 325 MG tablet   Oral   Take 325 mg by mouth daily.         . calcium carbonate (TUMS EX) 750 MG chewable tablet   Oral   Chew 3 tablets by mouth as needed for heartburn.         . diazepam (VALIUM) 5 MG tablet   Oral   Take 1 tablet (5 mg total) by mouth every 6 (six) hours as needed for muscle spasms.   50 tablet   0   . docusate sodium (COLACE) 100 MG capsule   Oral   Take 100 mg by mouth 2 (two) times daily.         Marland Kitchen docusate sodium 100 MG CAPS   Oral   Take 100 mg by mouth 2 (two) times daily.   60 capsule   0   . gabapentin (NEURONTIN) 300 MG capsule   Oral   Take 600 mg by mouth 3 (three) times daily.         Marland Kitchen ibuprofen (ADVIL,MOTRIN) 200 MG tablet   Oral   Take 200 mg by mouth daily as needed for moderate pain.         Marland Kitchen  Multiple Vitamin (MULTIVITAMIN) capsule   Oral   Take 1 capsule by mouth daily.         Marland Kitchen omeprazole (PRILOSEC) 20 MG capsule   Oral   Take 20 mg by mouth daily.         Marland Kitchen oxyCODONE-acetaminophen (PERCOCET/ROXICET) 5-325 MG per tablet   Oral   Take 1-2 tablets by mouth every 4 (four) hours as needed for moderate pain.   100 tablet   0   . pravastatin (PRAVACHOL) 40 MG tablet   Oral   Take 80 mg by mouth daily.         Marland Kitchen terazosin (HYTRIN) 2 MG capsule   Oral   Take 2 mg by mouth at bedtime.          BP 84/55  Pulse 75  Temp(Src) 97.7 F (36.5 C) (Oral)  Resp 18  Ht 5\' 9"  (1.753 m)  Wt 170 lb (77.111 kg)  BMI 25.09 kg/m2  SpO2 95% Physical Exam CONSTITUTIONAL: ill appearing HEAD: Normocephalic/atraumatic EYES: EOMI/PERRL, pupils pinpoint  ENMT: Mucous membranes dry NECK: supple no meningeal signs SPINE:entire spine nontender, healing incision noted to low back. No drainage or erythema.  CV: S1/S2 noted, no murmurs/rubs/gallops noted LUNGS: Lungs are clear to auscultation bilaterally, no apparent distress ABDOMEN: soft, nontender, no  rebound or guarding GU:no cva tenderness NEURO: Pt is somnolent but arousable  no saddle anesthesia,  equal distal motor strength noted with the following: hip flexion/knee flexion/extension, ankle dorsi/plantar flexion, great toe extension intact bilaterally, no clonus bilaterally, plantar reflex appropriate (toes downgoing),Equal patellar/achilles reflex noted (2+) in bilateral lower extremities.  EXTREMITIES: pulses normal, full ROM, no signs of trauma SKIN: warm, color normal PSYCH: somnolent ED Course  Procedures  CRITICAL CARE Performed by: Sharyon Cable Total critical care time: 35 Critical care time was exclusive of separately billable procedures and treating other patients. Critical care was necessary to treat or prevent imminent or life-threatening deterioration. Critical care was time spent personally by me on the following activities: development of treatment plan with patient and/or surrogate as well as nursing, discussions with consultants, evaluation of patient's response to treatment, examination of patient, obtaining history from patient or surrogate, ordering and performing treatments and interventions, ordering and review of laboratory studies, ordering and review of radiographic studies, pulse oximetry and re-evaluation of patient's condition.   DIAGNOSTIC STUDIES: Oxygen Saturation is 95% on 2L o2, adequate by my interpretation.    COORDINATION OF CARE: 11:37 AM Discussed treatment plan with pt at bedside and pt agreed to plan. Pt with global weakness, appears dehydrated and initially hypotensive and may be related to pain meds Will rehydrate and reassess.   Pt hypotensive and with altered mental status  1:28 PM Hypotension improved Pt still somnolent D/w family who is now at bedside - wife reports pt woke up drowsy, generalized weakness and difficulty walking (uses walker) Last pain meds at 4am He was given narcan here with minimal response Will obtain CT head  and also check u/a I am less suspicious for acute post op spinal complication as pt appears to have equal strength in LE when he is awake.      Ct head negative On reassessment still drowsy but arousable Will admit Suspect this may be due to pain meds/valium He is noted to have pneumonia (recent hospital stay less than 48hours)   D/w dr Anastasio Champion, will admit Labs Review Labs Reviewed  CBC WITH DIFFERENTIAL - Abnormal; Notable for the following:    WBC  14.2 (*)    RBC 3.67 (*)    Hemoglobin 11.3 (*)    HCT 32.9 (*)    Neutrophils Relative % 85 (*)    Neutro Abs 12.0 (*)    Lymphocytes Relative 8 (*)    All other components within normal limits  COMPREHENSIVE METABOLIC PANEL - Abnormal; Notable for the following:    Glucose, Bld 116 (*)    Creatinine, Ser 1.41 (*)    Albumin 2.9 (*)    GFR calc non Af Amer 47 (*)    GFR calc Af Amer 54 (*)    All other components within normal limits  URINE RAPID DRUG SCREEN (HOSP PERFORMED) - Abnormal; Notable for the following:    Benzodiazepines POSITIVE (*)    All other components within normal limits  SALICYLATE LEVEL - Abnormal; Notable for the following:    Salicylate Lvl <1.7 (*)    All other components within normal limits  URINALYSIS, ROUTINE W REFLEX MICROSCOPIC  ETHANOL  ACETAMINOPHEN LEVEL  I-STAT CG4 LACTIC ACID, ED   Imaging Review Ct Head Wo Contrast  01/13/2014   CLINICAL DATA:  Weakness, lethargy  EXAM: CT HEAD WITHOUT CONTRAST  TECHNIQUE: Contiguous axial images were obtained from the base of the skull through the vertex without intravenous contrast.  COMPARISON:  None.  FINDINGS: No evidence of parenchymal hemorrhage or extra-axial fluid collection. No mass lesion, mass effect, or midline shift.  No CT evidence of acute infarction.  Cerebral volume is within normal limits.  No ventriculomegaly.  The visualized paranasal sinuses are essentially clear. The mastoid air cells are unopacified.  No evidence of calvarial fracture.   IMPRESSION: No evidence of acute intracranial abnormality.   Electronically Signed   By: Julian Hy M.D.   On: 01/13/2014 13:55   Dg Chest Portable 1 View  01/13/2014   CLINICAL DATA:  Weakness, history of heart murmur.  EXAM: PORTABLE CHEST - 1 VIEW  COMPARISON:  None.  FINDINGS: The lungs are adequately inflated. There is minimal prominence of the interstitial markings on the left. There is partial obscuration of the left hemidiaphragm as well. The cardiac silhouette is normal in size. The pulmonary vascularity is not engorged. The mediastinum is normal in width. No significant pleural effusion is demonstrated. The observed portions of the bony thorax appear normal.  IMPRESSION: Increased interstitial density predominantly on the left as well as left basilar atelectasis is demonstrated. The findings may reflect interstitial type pneumonia. There is no overt evidence of CHF. A followup PA and lateral chest x-ray would be of value.   Electronically Signed   By: David  Martinique   On: 01/13/2014 12:24       Date: 01/13/2014  Rate: 75  Rhythm: normal sinus rhythm  QRS Axis: normal  Intervals: normal  ST/T Wave abnormalities: normal  Conduction Disutrbances:none     MDM   Final diagnoses:  CAP (community acquired pneumonia)  Dehydration  Altered mental status    Nursing notes including past medical history and social history reviewed and considered in documentation xrays reviewed and considered Labs/vital reviewed and considered   I personally performed the services described in this documentation, which was scribed in my presence. The recorded information has been reviewed and is accurate.      Sharyon Cable, MD 01/13/14 [phone]

## 2014-01-13 NOTE — ED Notes (Signed)
Rocephin pulled and sent to floor, Per Rn on floor, rather state once pt is in the room.

## 2014-01-13 NOTE — H&P (Signed)
[facility] History and Physical  Kevin Matthews VVO:[id number] DOB: 03/14/1934 DOA: 01/13/2014  Referring physician: Dr. Christy Gentles, ER. PCP: Leonides Grills, MD   Chief Complaint: Altered mental status.  HPI: Kevin Matthews is a 80 y.o. male  This is a 80 year old man who has altered mental status since 6 AM this morning. He underwent back surgery at [facility] and was discharged on 01/08/2014. He was discharged on a combination of diazepam every 6 hours 5 mg as well as oxycodone every 4 hours. He has been taking this fairly religiously. He as been fairly thirsty according to the wife. The patient is unable to give me history at the present time as he is quite somnolescent. The wife denies any seizures type activity. There is not been any difficulty with respiration. The patient previously had not complained of any chest pain or palpitations.   Review of Systems:  Constitutional:  No weight loss, night sweats, Fevers, chills, fatigue.  HEENT:  No headaches, Difficulty swallowing,Tooth/dental problems,Sore throat,  No sneezing, itching, ear ache, nasal congestion, post nasal drip,  Cardio-vascular:  No chest pain, Orthopnea, PND, swelling in lower extremities, anasarca, dizziness, palpitations  GI:  No heartburn, indigestion, abdominal pain, nausea, vomiting, diarrhea, change in bowel habits, loss of appetite  Resp:  No shortness of breath with exertion or at rest. No excess mucus, no productive cough, No non-productive cough, No coughing up of blood.No change in color of mucus.No wheezing.No chest wall deformity  Skin:  no rash or lesions.  GU:  no dysuria, change in color of urine, no urgency or frequency. No flank pain.  Musculoskeletal:  No joint pain or swelling. No decreased range of motion. No back pain.     Past Medical History  Diagnosis Date  . GERD (gastroesophageal reflux disease)   . Arthritis   . Heart murmur     dr Feliz Beam   Past  Surgical History  Procedure Laterality Date  . Vascular surgery    . Carotid endarterectomy    . Back surgery      x4  . Tonsillectomy    . Lumbar laminectomy/decompression microdiscectomy N/A 01/07/2014    Procedure: Lumbar three-four, Lumbar four-five redo laminectomy and foraminotomy;  Surgeon: Ophelia Charter, MD;  Location: [facility];  Service: Neurosurgery;  Laterality: N/A;  Lumbar three-four, Lumbar four-five redo laminectomy and foraminotomy   Social History:  reports that he has quit smoking. He does not have any smokeless tobacco history on file. He reports that he drinks alcohol. He reports that he does not use illicit drugs.       Prior to Admission medications   Medication Sig Start Date End Date Taking? Authorizing Provider  Alpha Lipoic Acid 200 MG CAPS Take 200 mg by mouth daily.   Yes Historical Provider, MD  aspirin 325 MG tablet Take 325 mg by mouth daily.   Yes Historical Provider, MD  calcium carbonate (TUMS EX) 750 MG chewable tablet Chew 3 tablets by mouth as needed for heartburn.   Yes Historical Provider, MD  diazepam (VALIUM) 5 MG tablet Take 1 tablet (5 mg total) by mouth every 6 (six) hours as needed for muscle spasms. 01/08/14  Yes Ophelia Charter, MD  docusate sodium 100 MG CAPS Take 100 mg by mouth 2 (two) times daily. 01/08/14  Yes Ophelia Charter, MD  gabapentin (NEURONTIN) 300 MG capsule Take 600 mg by mouth 3 (three) times daily.   Yes Historical  Provider, MD  ibuprofen (ADVIL,MOTRIN) 200 MG tablet Take 600 mg by mouth daily as needed for moderate pain.    Yes Historical Provider, MD  Multiple Vitamin (MULTIVITAMIN) capsule Take 1 capsule by mouth daily.   Yes Historical Provider, MD  omeprazole (PRILOSEC) 20 MG capsule Take 20 mg by mouth daily.   Yes Historical Provider, MD  oxyCODONE-acetaminophen (PERCOCET/ROXICET) 5-325 MG per tablet Take 1-2 tablets by mouth every 4 (four) hours as needed for moderate pain. 01/08/14  Yes Ophelia Charter, MD    pravastatin (PRAVACHOL) 40 MG tablet Take 80 mg by mouth daily.   Yes Historical Provider, MD  terazosin (HYTRIN) 2 MG capsule Take 2 mg by mouth at bedtime.   Yes Historical Provider, MD   Physical Exam: Filed Vitals:   01/13/14 1507  BP: 117/62  Pulse: 76  Temp:   Resp: 16    BP 117/62  Pulse 76  Temp(Src) 99.6 F (37.6 C) (Rectal)  Resp 16  Ht 5\' 9"  (1.753 m)  Wt 77.111 kg (170 lb)  BMI 25.09 kg/m2  SpO2 98%  General:  Somnolescent. Patient is arousable to name but is confused when I ask him questions. He appears clinically dehydrated. His peripheries are warm, however. Eyes: PERRL, normal lids, irises & conjunctiva ENT: grossly normal hearing, lips & tongue Neck: no LAD, masses or thyromegaly Cardiovascular: RRR, no m/r/g. No LE edema. Telemetry: SR, no arrhythmias  Respiratory: CTA bilaterally, no w/r/r. Normal respiratory effort. Abdomen: soft, ntnd Skin: no rash or induration seen on limited exam Musculoskeletal: grossly normal tone BUE/BLE Psychiatric: Not examined. Neurologic: grossly non-focal.          Labs on Admission:  Basic Metabolic Panel:  Recent Labs Lab 01/13/14 1145  NA 139  K 3.9  CL 99  CO2 29  GLUCOSE 116*  BUN 22  CREATININE 1.41*  CALCIUM 9.1   Liver Function Tests:  Recent Labs Lab 01/13/14 1145  AST 33  ALT 18  ALKPHOS 64  BILITOT 0.8  PROT 6.8  ALBUMIN 2.9*     CBC:  Recent Labs Lab 01/13/14 1145  WBC 14.2*  NEUTROABS 12.0*  HGB 11.3*  HCT 32.9*  MCV 89.6  PLT 210     Radiological Exams on Admission: Ct Head Wo Contrast  01/13/2014   CLINICAL DATA:  Weakness, lethargy  EXAM: CT HEAD WITHOUT CONTRAST  TECHNIQUE: Contiguous axial images were obtained from the base of the skull through the vertex without intravenous contrast.  COMPARISON:  None.  FINDINGS: No evidence of parenchymal hemorrhage or extra-axial fluid collection. No mass lesion, mass effect, or midline shift.  No CT evidence of acute infarction.   Cerebral volume is within normal limits.  No ventriculomegaly.  The visualized paranasal sinuses are essentially clear. The mastoid air cells are unopacified.  No evidence of calvarial fracture.  IMPRESSION: No evidence of acute intracranial abnormality.   Electronically Signed   By: Julian Hy M.D.   On: 01/13/2014 13:55   Dg Chest Portable 1 View  01/13/2014   CLINICAL DATA:  Weakness, history of heart murmur.  EXAM: PORTABLE CHEST - 1 VIEW  COMPARISON:  None.  FINDINGS: The lungs are adequately inflated. There is minimal prominence of the interstitial markings on the left. There is partial obscuration of the left hemidiaphragm as well. The cardiac silhouette is normal in size. The pulmonary vascularity is not engorged. The mediastinum is normal in width. No significant pleural effusion is demonstrated. The observed portions of the bony  thorax appear normal.  IMPRESSION: Increased interstitial density predominantly on the left as well as left basilar atelectasis is demonstrated. The findings may reflect interstitial type pneumonia. There is no overt evidence of CHF. A followup PA and lateral chest x-ray would be of value.   Electronically Signed   By: David  [place]   On: 01/13/2014 12:24      Assessment/Plan   1. Altered mental status, multifactorial. 2. Acute renal failure secondary to dehydration causing #1. 3. Benzodiazepine and opioid effects causing #1 in the face of renal failure. 4. Recent lumbar surgery affecting L2-L5. 5. Possible community-acquired pneumonia on chest x-ray although he does not really have any symptoms.  Plan: 1. Admit to telemetry. 2. Intravenous fluids. 3. Withhold benzodiazepines and opioids for the next 24 hours or so. 4. Intravenous antibiotics for empirical treatment of community-acquired pneumonia.  Other recommendations will depend on patient's hospital progress.   Code Status: Full code.   Family Communication: Discussed plan with patient's wife  at the bedside.   Disposition Plan: Home when medically stable.   Time spent: 45 minutes.  Doree Albee [facility] [phone].

## 2014-01-13 NOTE — ED Notes (Signed)
Placed pt on 02 at 2liters via [place].

## 2014-01-14 ENCOUNTER — Inpatient Hospital Stay (HOSPITAL_COMMUNITY): Payer: Medicare Other

## 2014-01-14 LAB — COMPREHENSIVE METABOLIC PANEL
ALBUMIN: 2.5 g/dL — AB (ref 3.5–5.2)
ALT: 15 U/L (ref 0–53)
AST: 28 U/L (ref 0–37)
Alkaline Phosphatase: 71 U/L (ref 39–117)
BILIRUBIN TOTAL: 0.6 mg/dL (ref 0.3–1.2)
BUN: 14 mg/dL (ref 6–23)
CHLORIDE: 105 meq/L (ref 96–112)
CO2: 28 mEq/L (ref 19–32)
Calcium: 8.6 mg/dL (ref 8.4–10.5)
Creatinine, Ser: 0.9 mg/dL (ref 0.50–1.35)
GFR calc Af Amer: >90 mL/min (ref >90)
GFR calc non Af Amer: 80 mL/min — ABNORMAL LOW (ref >90)
Glucose, Bld: 101 mg/dL — ABNORMAL HIGH (ref 70–99)
POTASSIUM: 3.9 meq/L (ref 3.7–5.3)
SODIUM: 142 meq/L (ref 137–147)
Total Protein: 6.4 g/dL (ref 6.0–8.3)

## 2014-01-14 LAB — CBC
HCT: 29.7 % — ABNORMAL LOW (ref 39.0–52.0)
Hemoglobin: 10 g/dL — ABNORMAL LOW (ref 13.0–17.0)
MCH: 30.7 pg (ref 26.0–34.0)
MCHC: 33.7 g/dL (ref 30.0–36.0)
MCV: 91.1 fL (ref 78.0–100.0)
Platelets: 211 10*3/uL (ref 150–400)
RBC: 3.26 MIL/uL — ABNORMAL LOW (ref 4.22–5.81)
RDW: 13.1 % (ref 11.5–15.5)
WBC: 20.4 10*3/uL — ABNORMAL HIGH (ref 4.0–10.5)

## 2014-01-14 MED ORDER — NAPROXEN 250 MG PO TABS
375.0000 mg | ORAL_TABLET | Freq: Two times a day (BID) | ORAL | Status: DC
  Administered 2014-01-14 – 2014-01-15 (×2): 375 mg via ORAL
  Filled 2014-01-14 (×2): qty 2

## 2014-01-14 NOTE — Care Management Note (Addendum)
    Page 1 of 2   01/15/2014     10:34:15 AM   CARE MANAGEMENT NOTE 01/15/2014  Patient:  Kevin Matthews, Kevin Matthews   Account Number:  [id number]  Date Initiated:  01/14/2014  Documentation initiated by:  Theophilus Kinds  Subjective/Objective Assessment:   Pt admitted from home with pneumonia. Pt lives with his wife and will return home at discharge. Pt has Matthews cane and walker for home use. Pt is fairly independent with ADL's. Pt just had back surgery last week.     Action/Plan:   No CM needs noted.   Anticipated DC Date:  01/16/2014   Anticipated DC Plan:  [place]  CM consult      [facility] Choice  HOME HEALTH   Choice offered to / List presented to:  C-1 Patient        [place] arranged  [facility]      [place].   Status of service:  Completed, signed off Medicare Important Message given?  NA - LOS <3 / Initial given by admissions (If response is "NO", the following Medicare IM given date fields will be blank) Date Medicare IM given:   Date Additional Medicare IM given:    Discharge Disposition:  HOME/SELF CARE  Per UR Regulation:    If discussed at Long Length of Stay Meetings, dates discussed:    Comments:  01/15/14 [place], RN BSN CM Pt discharged home today with [facility] (per pts choice). Romualdo Bolk of [facility] is aware and will collect the pts information from the chart. [place] services to start within 48 hours of discharge. No DME needs noted. Pt and pts nurse aware of discharge arrangements.  01/14/14 [place], RN BSN CM

## 2014-01-14 NOTE — Progress Notes (Signed)
UR chart review completed.  

## 2014-01-14 NOTE — Progress Notes (Signed)
Note: This document was prepared with digital dictation and possible smart phrase technology. Any transcriptional errors that result from this process are unintentional.   Kevin Matthews SWN:[id number] DOB: 24-Mar-1934 DOA: 01/13/2014 PCP: Leonides Grills, MD  Brief narrative: 80 y/o ?, s/p recent redo L2-3 t, L3-4, and L4-5 laminectomy on 01/07/2014 [name] was readmitted to [facility] on 01/13/49 with toxic metabolic encephalopathy. It was thought that this was secondary to a combination of benzodiazepine and oxycodone that he was taking religiously since surgery   Past medical history-As per Problem list Chart reviewed as below-   Consultants:  None  Procedures:  None  Antibiotics:  None   Subjective  Seems much more alert and oriented according to wife. Still weak Tolerating diet Is now able to get up and move around but some difficulty and not completely back to normal Denies fever chills chest pain dysuria diarrhea or vomiting   Objective    Interim History: None  Telemetry: None   Objective: Filed Vitals:   01/13/14 1629 01/13/14 2155 01/14/14 0504 01/14/14 0751  BP: 109/61 125/66 125/65   Pulse: 80 81 76   Temp: 97.8 F (36.6 C) 98.4 F (36.9 C) 98.1 F (36.7 C)   TempSrc: Oral Oral Oral   Resp: 18 18 18    Height:      Weight:      SpO2: 96% 96% 94% 95%    Intake/Output Summary (Last 24 hours) at 01/14/14 1529 Last data filed at 01/14/14 1500  Gross per 24 hour  Intake   3040 ml  Output    700 ml  Net   2340 ml    Exam:  General: EOMI, NCAT Cardiovascular: S1-S2 number rub or gallop Respiratory: Clinically clear Abdomen: Soft nontender nondistended Skin 7-10 cm well-healed scar secondary intention in the lumbar spine but no fluctuance or pain Neuro intact  Data Reviewed: Basic Metabolic Panel:  Recent Labs Lab 01/13/14 1145 01/14/14 0441  NA 139 142  K 3.9 3.9  CL 99 105  CO2 29 28  GLUCOSE 116* 101*  BUN 22 14    CREATININE 1.41* 0.90  CALCIUM 9.1 8.6   Liver Function Tests:  Recent Labs Lab 01/13/14 1145 01/14/14 0441  AST 33 28  ALT 18 15  ALKPHOS 64 71  BILITOT 0.8 0.6  PROT 6.8 6.4  ALBUMIN 2.9* 2.5*   No results found for this basename: LIPASE, AMYLASE,  in the last 168 hours No results found for this basename: AMMONIA,  in the last 168 hours CBC:  Recent Labs Lab 01/13/14 1145 01/14/14 0441  WBC 14.2* 20.4*  NEUTROABS 12.0*  --   HGB 11.3* 10.0*  HCT 32.9* 29.7*  MCV 89.6 91.1  PLT 210 211   Cardiac Enzymes: No results found for this basename: CKTOTAL, CKMB, CKMBINDEX, TROPONINI,  in the last 168 hours BNP: No components found with this basename: POCBNP,  CBG: No results found for this basename: GLUCAP,  in the last 168 hours  No results found for this or any previous visit (from the past 240 hour(s)).   Studies:              All Imaging reviewed and is as per above notation   Scheduled Meds: . azithromycin  500 mg Intravenous Q24H  . cefTRIAXone (ROCEPHIN)  IV  1 g Intravenous Q24H  . heparin  5,000 Units Subcutaneous 3 times per day   Continuous Infusions: . sodium chloride 125 mL/hr at 01/14/14 1324  Assessment/Plan: 1. Toxic metabolic encephalopathy secondary to polypharmacy-had discontinued all of his PTA medications.  He'll receive either Tylenol or Naprosyn for pain management. I suspect that the medications made him too much for him to handle and have cautioned his wife and patient personally about this 2. Recent lumbar surgery-followup as an outpatient with neurosurgeon and keep regular appointment.  Therapy consulted to recommend best disposition plan as he was moderate assist at home with wife and needed a walker subsequent to surgery 3. Leukocytosis-unclear etiology. Will discontinue HCAP empiric antibiotics for now and repeat a two-view x-ray today.  He was feeling cold and clammy to his wife on the day prior to admission and the only other  source I can think of is his wound which appears very well-healed 4. Acute kidney injury-admission creatinine 1.2 and now decreased to premorbid levels. Cut back IV saline from 125-50 cc per hour 3/18  Code Status: Full Family Communication: Discussed with wife at bedside Disposition Plan: Pending   Verneita Griffes, MD  [facility] Pager [phone] 01/14/2014, 3:29 PM    LOS: 1 day

## 2014-01-15 DIAGNOSIS — G92 Toxic encephalopathy: Principal | ICD-10-CM | POA: Diagnosis present

## 2014-01-15 DIAGNOSIS — G929 Unspecified toxic encephalopathy: Principal | ICD-10-CM | POA: Diagnosis present

## 2014-01-15 LAB — CBC WITH DIFFERENTIAL/PLATELET
Basophils Absolute: 0 10*3/uL (ref 0.0–0.1)
Basophils Relative: 0 % (ref 0–1)
Eosinophils Absolute: 0.4 10*3/uL (ref 0.0–0.7)
Eosinophils Relative: 3 % (ref 0–5)
HEMATOCRIT: 28.5 % — AB (ref 39.0–52.0)
Hemoglobin: 9.8 g/dL — ABNORMAL LOW (ref 13.0–17.0)
Lymphocytes Relative: 12 % (ref 12–46)
Lymphs Abs: 2 10*3/uL (ref 0.7–4.0)
MCH: 31.1 pg (ref 26.0–34.0)
MCHC: 34.4 g/dL (ref 30.0–36.0)
MCV: 90.5 fL (ref 78.0–100.0)
MONO ABS: 1.2 10*3/uL — AB (ref 0.1–1.0)
Monocytes Relative: 7 % (ref 3–12)
Neutro Abs: 13.1 10*3/uL — ABNORMAL HIGH (ref 1.7–7.7)
Neutrophils Relative %: 78 % — ABNORMAL HIGH (ref 43–77)
Platelets: 231 10*3/uL (ref 150–400)
RBC: 3.15 MIL/uL — ABNORMAL LOW (ref 4.22–5.81)
RDW: 13.2 % (ref 11.5–15.5)
WBC: 16.8 10*3/uL — ABNORMAL HIGH (ref 4.0–10.5)

## 2014-01-15 LAB — COMPREHENSIVE METABOLIC PANEL
ALBUMIN: 2.6 g/dL — AB (ref 3.5–5.2)
ALT: 15 U/L (ref 0–53)
AST: 28 U/L (ref 0–37)
Alkaline Phosphatase: 68 U/L (ref 39–117)
BUN: 10 mg/dL (ref 6–23)
CO2: 28 mEq/L (ref 19–32)
Calcium: 8.9 mg/dL (ref 8.4–10.5)
Chloride: 105 mEq/L (ref 96–112)
Creatinine, Ser: 0.83 mg/dL (ref 0.50–1.35)
GFR calc Af Amer: >90 mL/min (ref >90)
GFR calc non Af Amer: 83 mL/min — ABNORMAL LOW (ref >90)
Glucose, Bld: 94 mg/dL (ref 70–99)
Potassium: 4 mEq/L (ref 3.7–5.3)
Sodium: 142 mEq/L (ref 137–147)
Total Bilirubin: 0.5 mg/dL (ref 0.3–1.2)
Total Protein: 6.5 g/dL (ref 6.0–8.3)

## 2014-01-15 MED ORDER — DOXYCYCLINE HYCLATE 100 MG PO TABS
100.0000 mg | ORAL_TABLET | Freq: Two times a day (BID) | ORAL | Status: DC
Start: 2014-01-15 — End: 2014-01-15
  Filled 2014-01-15: qty 1

## 2014-01-15 MED ORDER — TAMSULOSIN HCL 0.4 MG PO CAPS: 0.4000 mg | ORAL_CAPSULE | Freq: Every day | ORAL | Status: AC

## 2014-01-15 NOTE — Evaluation (Signed)
Occupational Therapy Evaluation Patient Details Name: Kevin Matthews MRN: [id number] DOB: Apr 26, 1934 Today's Date: 01/15/2014 Time: 1310-1330 OT Time Calculation (min): 20 min  OT Assessment / Plan / Recommendation History of present illness Pt underwent laminectomy of L2-3, L3-4, L4-5 1 week ago.  He discharged on 01-08-14 to home.  He was admitted to [facility] on 01-13-14 with altered mental status and acute renal failure from dehydration.  It is thought that these problems stemmed from pain meds taken post op.  Pt has had cervical surgery many years ago and has had some weakness in his left hand resulting.  He is an avid golfer and is hopeful to get back to the game.   Clinical Impression   Pt is presenting to acute OT with above circumstances.  He is currenlty requirsing mod-max asssit with lower body ADls and reports being independent with upper body ADLs. He is comfortable with the system for ADLs that he and his wife currently have. Pt's wife is comfortable with assisting husband as needed.  Not OT follow up needed at this time. Educated pt and wife that if needs should arise after arriving home, they can contact their HHPT for further assist/getting HHOT if needed.    OT Assessment  Patient does not need any further OT services    Follow Up Recommendations  No OT follow up    Barriers to Discharge      Equipment Recommendations  None recommended by OT          Precautions / Restrictions Precautions Precautions: Fall;Back Restrictions Weight Bearing Restrictions: No   Pertinent Vitals/Pain Pt denies pain    ADL  Eating/Feeding: Independent Grooming: Independent Lower Body Bathing: Moderate assistance Lower Body Dressing: Maximal assistance ADL Comments: Pt's wife states she is currenlty assisting with ADLs, and pt and wife are content with their current ADL system.       OT Goals(Current goals can be found in the care plan section) Acute Rehab OT Goals Patient Stated  Goal: To go home today OT Goal Formulation: With patient/family  Visit Information  Last OT Received On: 01/15/14 History of Present Illness: Pt underwent laminectomy of L2-3, L3-4, L4-5 1 week ago.  He discharged on 01-08-14 to home.  He was admitted to [facility] on 01-13-14 with altered mental status and acute renal failure from dehydration.  It is thought that these problems stemmed from pain meds taken post op.  Pt has had cervical surgery many years ago and has had some weakness in his left hand resulting.  He is an avid golfer and is hopeful to get back to the game.       Prior [place] expects to be discharged to:: Private residence Living Arrangements: Spouse/significant other Available Help at Discharge: Family Type of Home: House Home Layout: [place]: Environmental consultant - 2 wheels;Cane - single point;Shower seat      Vision/Perception Vision - History Baseline Vision: Wears glasses all the time   Cognition  Cognition Arousal/Alertness: Awake/alert Behavior During Therapy: WFL for tasks assessed/performed Overall Cognitive Status: Within Functional Limits for tasks assessed    Extremity/Trunk Assessment Upper Extremity Assessment Upper Extremity Assessment: Generalized weakness;LUE deficits/detail LUE Deficits / Details: pt has decreased fine motor coordination/AROM in left hand from surgery in 1970. LUE Coordination: decreased fine motor Lower Extremity Assessment Lower Extremity Assessment: Defer to PT evaluation     Mobility Bed Mobility Bed Mobility: Sidelying to Sit Sidelying to sit: Min assist  Transfers Overall transfer level: Needs assistance Equipment used: Rolling walker (2 wheeled) Transfers: Sit to/from Stand Sit to Stand: Min guard General transfer comment: instruction for using his hands for push off from the bed     Exercise     Balance Balance Standing balance support: Bilateral upper extremity  supported;During functional activity Standing balance-Leahy Scale: Fair   End of Session OT - End of Session Activity Tolerance: Patient tolerated treatment well Patient left: in bed;with family/visitor present  [place], MS, OTR/L [phone]  01/15/2014, 1:35 PM

## 2014-01-15 NOTE — Progress Notes (Addendum)
Patient advanced to heart healthy diet for lunch.  Ate >50%.  Tolerated well.

## 2014-01-15 NOTE — Discharge Summary (Signed)
Physician Discharge Summary  Kevin Matthews ZOX:[id number] DOB: 16-Sep-1934 DOA: 01/13/2014  PCP: Kevin Grills, MD   Admit date: 01/13/2014 Discharge date: 01/15/2014  Time spent: 35 minutes  Recommendations for Outpatient Follow-up:  1. Recommended to follow closely with primary care physician/neurosurgeon for further instructions. 2. Home health PT ordered for stability purposes as recommended 3. Patient discontinued off multiple including opiates and benzodiazepines 4. Repeat chem 12 and CBC in about a week 5. Consider age-appropriate USPTF screening for prostate concerns as well as BPH  Discharge Diagnoses:  Active Problems:   CAP (community acquired pneumonia)   Altered mental status   Discharge Condition: Good  Diet recommendation: Regular  Filed Weights   01/13/14 1125  Weight: 77.111 kg (170 lb)    History of present illness:  80 y/o ?, s/p recent redo L2-3 t, L3-4, and L4-5 laminectomy on 01/07/2014 Kevin Matthews was readmitted to [facility] on 01/13/77 with toxic metabolic encephalopathy. It was thought that this was secondary to a combination of benzodiazepine and oxycodone that he was taking religiously since surgery There was an initial concern that patient might have had pneumonia on admission as x-ray was suggestive of the same with a white count plus he had low-grade temps at home however patient did not demonstrate any cough any wheeze any fever chills any sputum and antibiotics were discontinued after 2 days of IV therapies.    Hospital Course:   1. Toxic metabolic encephalopathy secondary to polypharmacy-had discontinued all of his PTA medications. He'll receive either Tylenol or ibuprofen alone for pain management. I suspect that the medications made him too much for him to handle and have cautioned his wife and patient personally about this on discharge 2. Recent lumbar surgery-followup as an outpatient with neurosurgeon and keep regular appointment. Therapy  consulted to recommend best disposition plan and recommended that patient get home health PT 3. Leukocytosis-unclear etiology. Discontinued HCAP empiric antibiotics after 2 days. Chest x-ray not suggestive of infection more so atelectasis without fever chills about 4. Acute kidney injury-admission creatinine 1.2 and now decreased to premorbid levels. Cut back IV saline from 125-50 cc per hour 3/18  Procedures:  X-rays (i.e. Studies not automatically included, echos, thoracentesis, etc; not x-rays)  Consultations:  None  Discharge Exam: Filed Vitals:   01/15/14 0600  BP: 149/73  Pulse: 77  Temp: 98.5 F (36.9 C)  Resp: 20    General: Alert much more close to premorbid self, ambulatory with PT this morning Cardiovascular: S1-S2 no murmur rub or gallop Respiratory: Clinically clear  Discharge Instructions     Medication List    STOP taking these medications       diazepam 5 MG tablet  Commonly known as:  VALIUM     oxyCODONE-acetaminophen 5-325 MG per tablet  Commonly known as:  PERCOCET/ROXICET     terazosin 2 MG capsule  Commonly known as:  HYTRIN      TAKE these medications       Alpha Lipoic Acid 200 MG Caps  Take 200 mg by mouth daily.     aspirin 325 MG tablet  Take 325 mg by mouth daily.     calcium carbonate 750 MG chewable tablet  Commonly known as:  TUMS EX  Chew 3 tablets by mouth as needed for heartburn.     DSS 100 MG Caps  Take 100 mg by mouth 2 (two) times daily.     gabapentin 300 MG capsule  Commonly known as:  NEURONTIN  Take  600 mg by mouth 3 (three) times daily.     ibuprofen 200 MG tablet  Commonly known as:  ADVIL,MOTRIN  Take 600 mg by mouth daily as needed for moderate pain.     multivitamin capsule  Take 1 capsule by mouth daily.     omeprazole 20 MG capsule  Commonly known as:  PRILOSEC  Take 20 mg by mouth daily.     pravastatin 40 MG tablet  Commonly known as:  PRAVACHOL  Take 80 mg by mouth daily.     tamsulosin  0.4 MG Caps capsule  Commonly known as:  FLOMAX  Take 1 capsule (0.4 mg total) by mouth daily after supper.       No Known Allergies    The results of significant diagnostics from this hospitalization (including imaging, microbiology, ancillary and laboratory) are listed below for reference.    Significant Diagnostic Studies: Dg Chest 2 View  01/14/2014   CLINICAL DATA:  Evaluate for pneumonitis  EXAM: CHEST  2 VIEW  COMPARISON:  DG CHEST 1V PORT dated 01/13/2014  FINDINGS: The cardiac silhouette is enlarged. Stable interstitial changes are appreciated within the right ninth hemithoraces. No new focal regions of consolidation nor new focal infiltrates are identified. The osseous structures are unremarkable.  IMPRESSION: Stable interstitial changes without evidence of focal regions of consolidation. Differential considerations include infectious or inflammatory interstitial pneumonitis as well as chronic interstitial fibrotic changes.   Electronically Signed   By: Kevin Matthews M.D.   On: 01/14/2014 17:26   Ct Head Wo Contrast  01/13/2014   CLINICAL DATA:  Weakness, lethargy  EXAM: CT HEAD WITHOUT CONTRAST  TECHNIQUE: Contiguous axial images were obtained from the base of the skull through the vertex without intravenous contrast.  COMPARISON:  None.  FINDINGS: No evidence of parenchymal hemorrhage or extra-axial fluid collection. No mass lesion, mass effect, or midline shift.  No CT evidence of acute infarction.  Cerebral volume is within normal limits.  No ventriculomegaly.  The visualized paranasal sinuses are essentially clear. The mastoid air cells are unopacified.  No evidence of calvarial fracture.  IMPRESSION: No evidence of acute intracranial abnormality.   Electronically Signed   By: Kevin Matthews M.D.   On: 01/13/2014 13:55   Dg Lumbar Spine 1 View  01/07/2014   CLINICAL DATA L3-4, L4-5 laminectomy.  EXAM LUMBAR SPINE - 1 VIEW  COMPARISON None.  FINDINGS Single lateral intraoperative  image demonstrates posterior instruments directed at the L3-4 level.  IMPRESSION Intraoperative localization as above.  SIGNATURE  Electronically Signed   By: Kevin Matthews M.D.   On: 01/07/2014 12:20   Dg Chest Portable 1 View  01/13/2014   CLINICAL DATA:  Weakness, history of heart murmur.  EXAM: PORTABLE CHEST - 1 VIEW  COMPARISON:  None.  FINDINGS: The lungs are adequately inflated. There is minimal prominence of the interstitial markings on the left. There is partial obscuration of the left hemidiaphragm as well. The cardiac silhouette is normal in size. The pulmonary vascularity is not engorged. The mediastinum is normal in width. No significant pleural effusion is demonstrated. The observed portions of the bony thorax appear normal.  IMPRESSION: Increased interstitial density predominantly on the left as well as left basilar atelectasis is demonstrated. The findings may reflect interstitial type pneumonia. There is no overt evidence of CHF. A followup PA and lateral chest x-ray would be of value.   Electronically Signed   By: David  Martinique   On: 01/13/2014 12:24  Microbiology: No results found for this or any previous visit (from the past 240 hour(s)).   Labs: Basic Metabolic Panel:  Recent Labs Lab 01/13/14 1145 01/14/14 0441 01/15/14 0439  NA 139 142 142  K 3.9 3.9 4.0  CL 99 105 105  CO2 29 28 28   GLUCOSE 116* 101* 94  BUN 22 14 10   CREATININE 1.41* 0.90 0.83  CALCIUM 9.1 8.6 8.9   Liver Function Tests:  Recent Labs Lab 01/13/14 1145 01/14/14 0441 01/15/14 0439  AST 33 28 28  ALT 18 15 15   ALKPHOS 64 71 68  BILITOT 0.8 0.6 0.5  PROT 6.8 6.4 6.5  ALBUMIN 2.9* 2.5* 2.6*   No results found for this basename: LIPASE, AMYLASE,  in the last 168 hours No results found for this basename: AMMONIA,  in the last 168 hours CBC:  Recent Labs Lab 01/13/14 1145 01/14/14 0441 01/15/14 0439  WBC 14.2* 20.4* 16.8*  NEUTROABS 12.0*  --  13.1*  HGB 11.3* 10.0* 9.8*  HCT  32.9* 29.7* 28.5*  MCV 89.6 91.1 90.5  PLT 210 211 231   Cardiac Enzymes: No results found for this basename: CKTOTAL, CKMB, CKMBINDEX, TROPONINI,  in the last 168 hours BNP: BNP (last 3 results) No results found for this basename: PROBNP,  in the last 8760 hours CBG: No results found for this basename: GLUCAP,  in the last 168 hours     Signed:  Nita Sells  [facility] 01/15/2014, 10:21 AM

## 2014-01-15 NOTE — Evaluation (Signed)
Physical Therapy Evaluation Patient Details Name: Kevin Matthews MRN: [id number] DOB: 03/15/1934 Today's Date: 01/15/2014 Time: 9702-6378 PT Time Calculation (min): 37 min  PT Assessment / Plan / Recommendation History of Present Illness  Pt underwent laminectomy of L2-3, L3-4, L4-5 1 week ago.  He discharged on 01-08-14 to home.  He was admitted to [facility] on 01-13-14 with altered mental status and acute renal failure from dehydration.  It is thought that these problems stemmed from pain meds taken post op.  Pt has had cervical surgery many years ago and has had some weakness in his left hand resulting.  He is an avid golfer and is hopeful to get back to the game.  Clinical Impression   Pt is seen for evaluation.  He was alert and oriented, very cooperative and anxious to get home.  He does have weakness in both LEs and decreased standing balance.  He continues to need a walker for gait stability.  He has a few significant gait abnormalities which put him at increased risk of fall.  We discussed safety at home with pt and wife.  I am recommending HHPT at d/c.    PT Assessment  All further PT needs can be met in the next venue of care    Follow Up Recommendations  Home health PT               Equipment Recommendations  None recommended by PT             Precautions / Restrictions Precautions Precautions: Fall;Back Restrictions Weight Bearing Restrictions: No     Mobility  Bed Mobility Bed Mobility: Sidelying to Sit Sidelying to sit: Min assist Transfers Overall transfer level: Needs assistance Equipment used: Rolling walker (2 wheeled) Transfers: Sit to/from Stand Sit to Stand: Min guard General transfer comment: instruction for using his hands for push off from the bed Ambulation/Gait Ambulation/Gait assistance: Min guard Ambulation Distance (Feet): 150 Feet Assistive device: Rolling walker (2 wheeled) Gait Pattern/deviations: Decreased dorsiflexion - left;Decreased  dorsiflexion - right;Decreased stride length;Narrow base of support General Gait Details: pt has less numbness in the LLE and strength now equals RLE...he has very poor hip flexion on swing thru and almost no DF, although he has the muscle power to overcome these abnormalities.   Much of these abnormalities could be from habilt.    Exercises     PT Diagnosis: Abnormality of gait  PT Problem List: Decreased strength;Decreased balance;Decreased activity tolerance PT Treatment Interventions:       PT Goals(Current goals can be found in the care plan section) Acute Rehab PT Goals PT Goal Formulation: No goals set, d/c therapy  Visit Information  History of Present Illness: Pt underwent laminectomy of L2-3, L3-4, L4-5 1 week ago.  He discharged on 01-08-14 to home.  He was admitted to [facility] on 01-13-14 with altered mental status and acute renal failure from dehydration.  It is thought that these problems stemmed from pain meds taken post op.  Pt has had cervical surgery many years ago and has had some weakness in his left hand resulting.  He is an avid golfer and is hopeful to get back to the game.       Prior Functioning       Cognition  Cognition Arousal/Alertness: Awake/alert Behavior During Therapy: WFL for tasks assessed/performed Overall Cognitive Status: Within Functional Limits for tasks assessed    Extremity/Trunk Assessment Lower Extremity Assessment Lower Extremity Assessment: Generalized weakness   Balance Balance Standing balance  support: Bilateral upper extremity supported;During functional activity Standing balance-Leahy Scale: Fair  End of Session PT - End of Session Equipment Utilized During Treatment: Gait belt Activity Tolerance: Patient tolerated treatment well Patient left: in bed;with call bell/phone within reach;with family/visitor present Nurse Communication: Mobility status  GP     Sable Feil 01/15/2014, 10:14 AM

## 2014-01-15 NOTE — Progress Notes (Signed)
Patient's IV removed. Site WNL.  AVS reviewed with patient's wife.  Verbalized understanding of medications, discharge instructions and physician follow-up.  Patient transported by NT via w/c to main entrance for discharge.  Patient stable at time discharge.

## 2014-01-22 ENCOUNTER — Emergency Department (HOSPITAL_COMMUNITY)
Admission: EM | Admit: 2014-01-22 | Discharge: 2014-01-22 | Disposition: A | Discharge status: Home / Self Care | Payer: Medicare Other | Attending: Emergency Medicine | Admitting: Emergency Medicine

## 2014-01-22 ENCOUNTER — Encounter (HOSPITAL_COMMUNITY): Payer: Self-pay | Admitting: Emergency Medicine

## 2014-01-22 DIAGNOSIS — K219 Gastro-esophageal reflux disease without esophagitis: Secondary | ICD-10-CM | POA: Insufficient documentation

## 2014-01-22 DIAGNOSIS — R5381 Other malaise: Secondary | ICD-10-CM | POA: Insufficient documentation

## 2014-01-22 DIAGNOSIS — R531 Weakness: Secondary | ICD-10-CM

## 2014-01-22 DIAGNOSIS — Z7982 Long term (current) use of aspirin: Secondary | ICD-10-CM | POA: Insufficient documentation

## 2014-01-22 DIAGNOSIS — Z79899 Other long term (current) drug therapy: Secondary | ICD-10-CM | POA: Insufficient documentation

## 2014-01-22 DIAGNOSIS — M129 Arthropathy, unspecified: Secondary | ICD-10-CM | POA: Insufficient documentation

## 2014-01-22 DIAGNOSIS — Z87891 Personal history of nicotine dependence: Secondary | ICD-10-CM | POA: Insufficient documentation

## 2014-01-22 DIAGNOSIS — R5383 Other fatigue: Principal | ICD-10-CM

## 2014-01-22 DIAGNOSIS — R011 Cardiac murmur, unspecified: Secondary | ICD-10-CM | POA: Insufficient documentation

## 2014-01-22 LAB — COMPREHENSIVE METABOLIC PANEL
ALBUMIN: 3.1 g/dL — AB (ref 3.5–5.2)
ALK PHOS: 98 U/L (ref 39–117)
ALT: 41 U/L (ref 0–53)
AST: 38 U/L — ABNORMAL HIGH (ref 0–37)
BUN: 18 mg/dL (ref 6–23)
CALCIUM: 10.3 mg/dL (ref 8.4–10.5)
CO2: 32 mEq/L (ref 19–32)
Chloride: 100 mEq/L (ref 96–112)
Creatinine, Ser: 1.1 mg/dL (ref 0.50–1.35)
GFR calc non Af Amer: 63 mL/min — ABNORMAL LOW (ref >90)
GFR, EST AFRICAN AMERICAN: 73 mL/min — AB (ref >90)
Glucose, Bld: 124 mg/dL — ABNORMAL HIGH (ref 70–99)
POTASSIUM: 5.1 meq/L (ref 3.7–5.3)
SODIUM: 141 meq/L (ref 137–147)
TOTAL PROTEIN: 8.1 g/dL (ref 6.0–8.3)
Total Bilirubin: 0.3 mg/dL (ref 0.3–1.2)

## 2014-01-22 LAB — CBC WITH DIFFERENTIAL/PLATELET
BASOS ABS: 0.1 10*3/uL (ref 0.0–0.1)
BASOS PCT: 1 % (ref 0–1)
EOS ABS: 0.3 10*3/uL (ref 0.0–0.7)
EOS PCT: 2 % (ref 0–5)
HCT: 37.3 % — ABNORMAL LOW (ref 39.0–52.0)
Hemoglobin: 12.2 g/dL — ABNORMAL LOW (ref 13.0–17.0)
Lymphocytes Relative: 12 % (ref 12–46)
Lymphs Abs: 1.9 10*3/uL (ref 0.7–4.0)
MCH: 30 pg (ref 26.0–34.0)
MCHC: 32.7 g/dL (ref 30.0–36.0)
MCV: 91.6 fL (ref 78.0–100.0)
Monocytes Absolute: 0.9 10*3/uL (ref 0.1–1.0)
Monocytes Relative: 6 % (ref 3–12)
NEUTROS PCT: 79 % — AB (ref 43–77)
Neutro Abs: 12.4 10*3/uL — ABNORMAL HIGH (ref 1.7–7.7)
PLATELETS: 417 10*3/uL — AB (ref 150–400)
RBC: 4.07 MIL/uL — ABNORMAL LOW (ref 4.22–5.81)
RDW: 13.1 % (ref 11.5–15.5)
WBC: 15.6 10*3/uL — ABNORMAL HIGH (ref 4.0–10.5)

## 2014-01-22 LAB — URINALYSIS, ROUTINE W REFLEX MICROSCOPIC
BILIRUBIN URINE: NEGATIVE
GLUCOSE, UA: NEGATIVE mg/dL
Hgb urine dipstick: NEGATIVE
KETONES UR: NEGATIVE mg/dL
LEUKOCYTES UA: NEGATIVE
NITRITE: NEGATIVE
PH: 6.5 (ref 5.0–8.0)
Protein, ur: NEGATIVE mg/dL
Specific Gravity, Urine: 1.01 (ref 1.005–1.030)
Urobilinogen, UA: 0.2 mg/dL (ref 0.0–1.0)

## 2014-01-22 NOTE — ED Notes (Signed)
Pt reports back surgery on 3/11. States that he since surgery he has weakness in bilateral lower extremities, some days worse than others. This morning pt fell 2/2 weakness and called EMS. Denies head trauma, LOC and pain.

## 2014-01-22 NOTE — ED Notes (Signed)
[facility] called for transport back home.

## 2014-01-22 NOTE — ED Provider Notes (Signed)
CSN: [id number]     Arrival date & time 01/22/14  2992 History   First MD Initiated Contact with Patient 01/22/14 [phone]     Chief Complaint  Patient presents with  . Extremity Weakness     (Consider location/radiation/quality/duration/timing/severity/associated sxs/prior Treatment) Patient is a 80 y.o. male presenting with weakness. The history is provided by the patient (the pt states since he had surgery march 11 he has had weakness in his legs.  pt slid down the bed today to the floor).  Weakness This is a recurrent problem. The current episode started more than 2 days ago. The problem occurs constantly. The problem has not changed since onset.Pertinent negatives include no chest pain, no abdominal pain and no headaches. Nothing aggravates the symptoms. Nothing relieves the symptoms.    Past Medical History  Diagnosis Date  . GERD (gastroesophageal reflux disease)   . Arthritis   . Heart murmur     dr Feliz Beam   Past Surgical History  Procedure Laterality Date  . Vascular surgery    . Carotid endarterectomy    . Back surgery      x4  . Tonsillectomy    . Lumbar laminectomy/decompression microdiscectomy N/A 01/07/2014    Procedure: Lumbar three-four, Lumbar four-five redo laminectomy and foraminotomy;  Surgeon: Ophelia Charter, MD;  Location: [place] [facility];  Service: Neurosurgery;  Laterality: N/A;  Lumbar three-four, Lumbar four-five redo laminectomy and foraminotomy   History reviewed. No pertinent family history. History  Substance Use Topics  . Smoking status: Former Research scientist (life sciences)  . Smokeless tobacco: Not on file  . Alcohol Use: Yes     Comment: weekly    Review of Systems  Constitutional: Positive for fatigue. Negative for appetite change.  HENT: Negative for congestion, ear discharge and sinus pressure.   Eyes: Negative for discharge.  Respiratory: Negative for cough.   Cardiovascular: Negative for chest pain.  Gastrointestinal: Negative for abdominal pain and  diarrhea.  Genitourinary: Negative for frequency and hematuria.  Musculoskeletal: Negative for back pain.  Skin: Negative for rash.  Neurological: Positive for weakness. Negative for seizures and headaches.  Psychiatric/Behavioral: Negative for hallucinations.      Allergies  Oxycodone  Home Medications   Current Outpatient Rx  Name  Route  Sig  Dispense  Refill  . Alpha Lipoic Acid 200 MG CAPS   Oral   Take 200 mg by mouth daily.         Marland Kitchen aspirin 325 MG tablet   Oral   Take 325 mg by mouth daily.         . calcium carbonate (TUMS EX) 750 MG chewable tablet   Oral   Chew 3 tablets by mouth as needed for heartburn.         . docusate sodium 100 MG CAPS   Oral   Take 100 mg by mouth 2 (two) times daily.   60 capsule   0   . gabapentin (NEURONTIN) 300 MG capsule   Oral   Take 600 mg by mouth 3 (three) times daily.         Marland Kitchen ibuprofen (ADVIL,MOTRIN) 200 MG tablet   Oral   Take 600 mg by mouth daily as needed for moderate pain.          Marland Kitchen loratadine (CLARITIN) 10 MG tablet   Oral   Take 10 mg by mouth daily.         . Multiple Vitamin (MULTIVITAMIN) capsule  Oral   Take 1 capsule by mouth daily.         Marland Kitchen omeprazole (PRILOSEC) 20 MG capsule   Oral   Take 20 mg by mouth daily.         . pravastatin (PRAVACHOL) 40 MG tablet   Oral   Take 80 mg by mouth daily.         . tamsulosin (FLOMAX) 0.4 MG CAPS capsule   Oral   Take 1 capsule (0.4 mg total) by mouth daily after supper.   30 capsule   0    Pulse 89  Temp(Src) 99.6 F (37.6 C) (Oral)  Resp 17  Ht 5\' 9"  (1.753 m)  Wt 170 lb (77.111 kg)  BMI 25.09 kg/m2  SpO2 94% Physical Exam  Constitutional: He is oriented to person, place, and time. He appears well-developed.  HENT:  Head: Normocephalic.  Eyes: Conjunctivae and EOM are normal. No scleral icterus.  Neck: Neck supple. No thyromegaly present.  Cardiovascular: Normal rate and regular rhythm.  Exam reveals no gallop and no  friction rub.   No murmur heard. Pulmonary/Chest: No stridor. He has no wheezes. He has no rales. He exhibits no tenderness.  Abdominal: He exhibits no distension. There is no tenderness. There is no rebound.  Musculoskeletal: Normal range of motion. He exhibits no edema.  Healed surgical scar to back.  Mild swelling but not tender near scar  Lymphadenopathy:    He has no cervical adenopathy.  Neurological: He is oriented to person, place, and time. He exhibits normal muscle tone.  Profound weakness to legs.  Pt can stand for a short while but cannot walk.  Decreased reflexes  Skin: No rash noted. No erythema.  Psychiatric: He has a normal mood and affect. His behavior is normal.    ED Course  Procedures (including critical care time) Labs Review Labs Reviewed  CBC WITH DIFFERENTIAL - Abnormal; Notable for the following:    WBC 15.6 (*)    RBC 4.07 (*)    Hemoglobin 12.2 (*)    HCT 37.3 (*)    Platelets 417 (*)    Neutrophils Relative % 79 (*)    Neutro Abs 12.4 (*)    All other components within normal limits  COMPREHENSIVE METABOLIC PANEL - Abnormal; Notable for the following:    Glucose, Bld 124 (*)    Albumin 3.1 (*)    AST 38 (*)    GFR calc non Af Amer 63 (*)    GFR calc Af Amer 73 (*)    All other components within normal limits  URINALYSIS, ROUTINE W REFLEX MICROSCOPIC   Imaging Review No results found.   EKG Interpretation None     I spoke with dr. Electa Sniff and he stated the pt should rest at home and keep his appointment Monday.  No mri now MDM   Final diagnoses:  Weakness        Maudry Diego, MD 01/22/14 1017

## 2014-01-22 NOTE — Discharge Instructions (Signed)
Rest at home this weekend and follow up next week with your md as planned

## 2014-01-22 NOTE — ED Notes (Signed)
nad noted prior to dc. Dc instructions reviewed and explained. Pt voiced understanding.  

## 2014-01-22 NOTE — ED Notes (Signed)
EMS here to transport pt home.

## 2015-04-29 IMAGING — CR DG CHEST 1V PORT
1 series · 1 of 1 positions shown · non-contrast
Comparison: None.

CLINICAL DATA: Weakness, history of heart murmur.

EXAM:
PORTABLE CHEST - 1 VIEW

[portable]
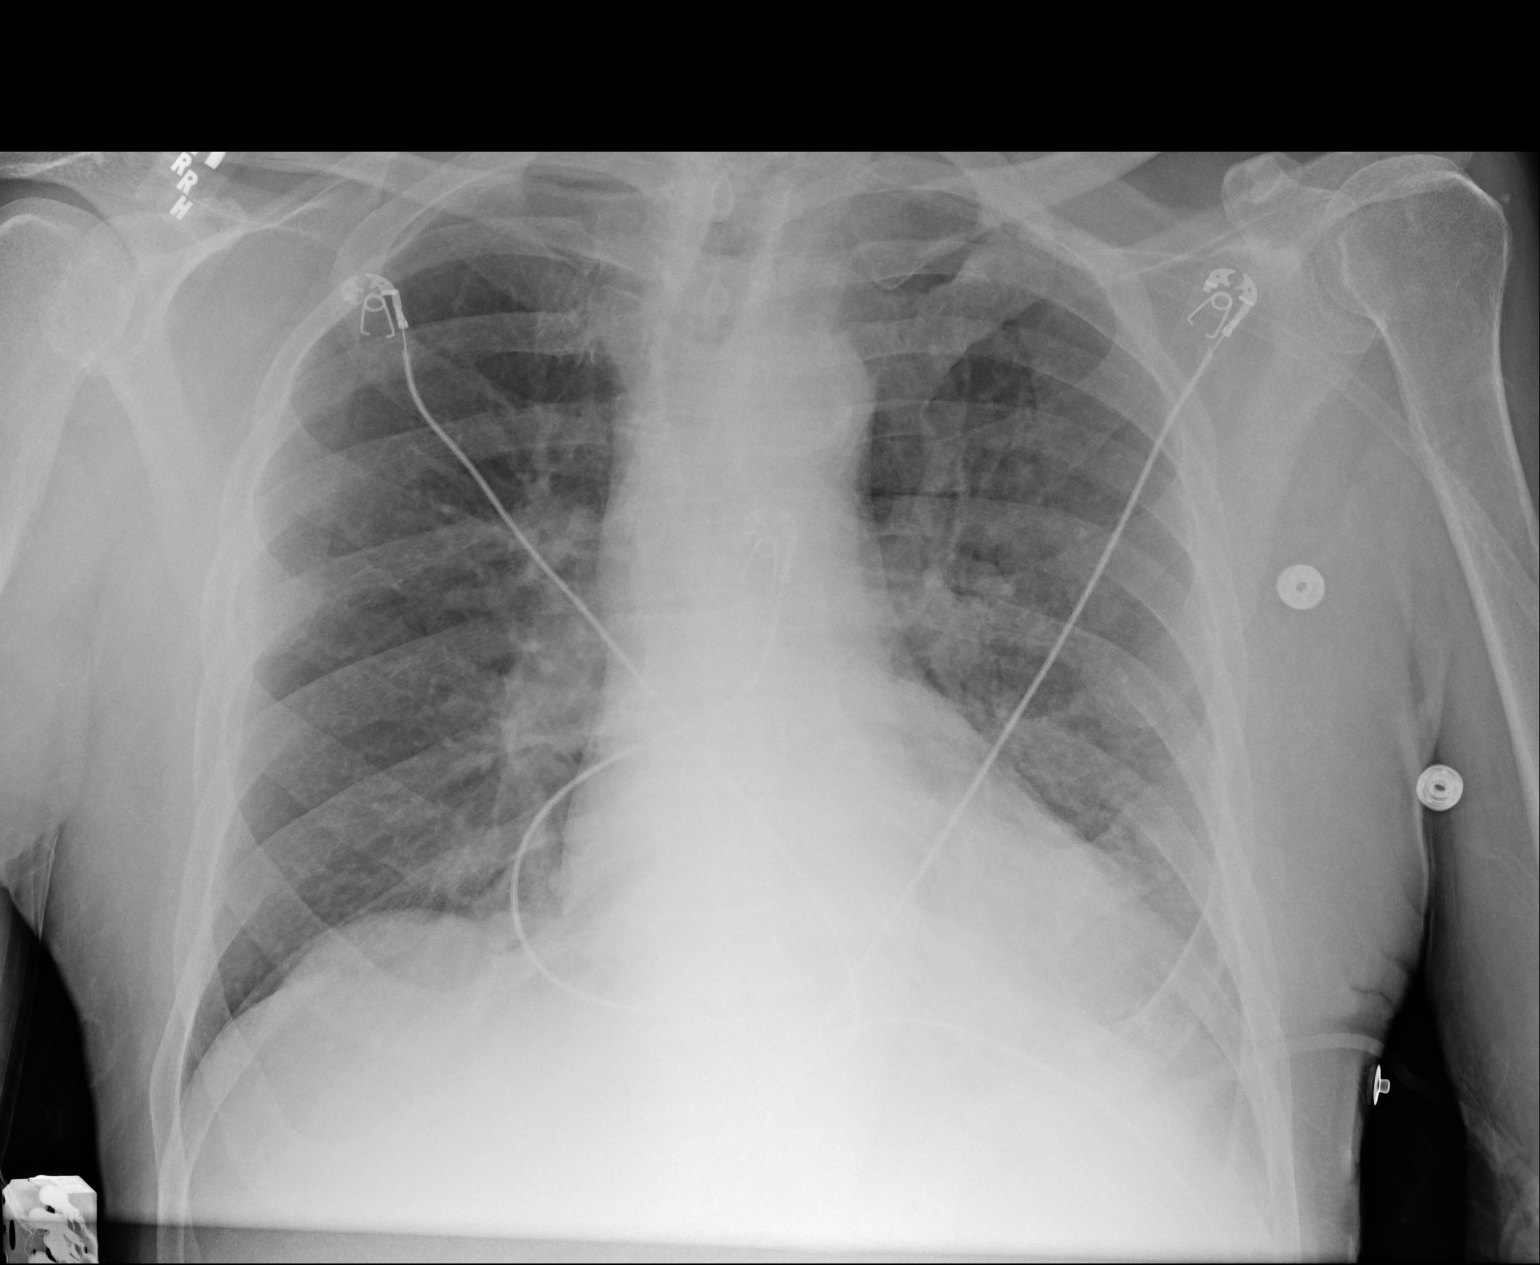

[1 of 1 positions shown; findings below may reference images not displayed]

FINDINGS: The lungs are adequately inflated. There is minimal prominence of
the interstitial markings on the left. There is partial obscuration
of the left hemidiaphragm as well. The cardiac silhouette is normal
in size. The pulmonary vascularity is not engorged. The mediastinum
is normal in width. No significant pleural effusion is demonstrated.
The observed portions of the bony thorax appear normal.
IMPRESSION: Increased interstitial density predominantly on the left as well as
left basilar atelectasis is demonstrated. The findings may reflect
interstitial type pneumonia. There is no overt evidence of CHF. A
followup PA and lateral chest x-ray would be of value.

## 2015-07-20 NOTE — Patient Instructions (Signed)
Your procedure is scheduled on: 07/27/2015   Report to [facility] at  900  AM.  Call this number if you have problems the morning of surgery: [phone]   Do not eat food or drink liquids :After Midnight.      Take these medicines the morning of surgery with A SIP OF WATER: neurontin, prilosec, flomax   Do not wear jewelry, make-up or nail polish.  Do not wear lotions, powders, or perfumes. You may wear deodorant.  Do not shave 48 hours prior to surgery.  Do not bring valuables to the hospital.  Contacts, dentures or bridgework may not be worn into surgery.  Leave suitcase in the car. After surgery it may be brought to your room.  For patients admitted to the hospital, checkout time is 11:00 AM the day of discharge.   Patients discharged the day of surgery will not be allowed to drive home.  :     Please read over the following fact sheets that you were given: Coughing and Deep Breathing, Surgical Site Infection Prevention, Anesthesia Post-op Instructions and Care and Recovery After Surgery    Cataract A cataract is a clouding of the lens of the eye. When a lens becomes cloudy, vision is reduced based on the degree and nature of the clouding. Many cataracts reduce vision to some degree. Some cataracts make people more near-sighted as they develop. Other cataracts increase glare. Cataracts that are ignored and become worse can sometimes look white. The white color can be seen through the pupil. CAUSES   Aging. However, cataracts may occur at any age, even in newborns.   Certain drugs.   Trauma to the eye.   Certain diseases such as diabetes.   Specific eye diseases such as chronic inflammation inside the eye or a sudden attack of a rare form of glaucoma.   Inherited or acquired medical problems.  SYMPTOMS   Gradual, progressive drop in vision in the affected eye.   Severe, rapid visual loss. This most often happens when trauma is the cause.  DIAGNOSIS  To detect a cataract,  an eye doctor examines the lens. Cataracts are best diagnosed with an exam of the eyes with the pupils enlarged (dilated) by drops.  TREATMENT  For an early cataract, vision may improve by using different eyeglasses or stronger lighting. If that does not help your vision, surgery is the only effective treatment. A cataract needs to be surgically removed when vision loss interferes with your everyday activities, such as driving, reading, or watching TV. A cataract may also have to be removed if it prevents examination or treatment of another eye problem. Surgery removes the cloudy lens and usually replaces it with a substitute lens (intraocular lens, IOL).  At a time when both you and your doctor agree, the cataract will be surgically removed. If you have cataracts in both eyes, only one is usually removed at a time. This allows the operated eye to heal and be out of danger from any possible problems after surgery (such as infection or poor wound healing). In rare cases, a cataract may be doing damage to your eye. In these cases, your caregiver may advise surgical removal right away. The vast majority of people who have cataract surgery have better vision afterward. HOME CARE INSTRUCTIONS  If you are not planning surgery, you may be asked to do the following:  Use different eyeglasses.   Use stronger or brighter lighting.   Ask your eye doctor about reducing your  medicine dose or changing medicines if it is thought that a medicine caused your cataract. Changing medicines does not make the cataract go away on its own.   Become familiar with your surroundings. Poor vision can lead to injury. Avoid bumping into things on the affected side. You are at a higher risk for tripping or falling.   Exercise extreme care when driving or operating machinery.   Wear sunglasses if you are sensitive to bright light or experiencing problems with glare.  SEEK IMMEDIATE MEDICAL CARE IF:   You have a worsening or  sudden vision loss.   You notice redness, swelling, or increasing pain in the eye.   You have a fever.  Document Released: 10/16/2005 Document Revised: 10/05/2011 Document Reviewed: 06/09/2011 [facility] Patient Information 2012 [place].PATIENT INSTRUCTIONS POST-ANESTHESIA  IMMEDIATELY FOLLOWING SURGERY:  Do not drive or operate machinery for the first twenty four hours after surgery.  Do not make any important decisions for twenty four hours after surgery or while taking narcotic pain medications or sedatives.  If you develop intractable nausea and vomiting or a severe headache please notify your doctor immediately.  FOLLOW-UP:  Please make an appointment with your surgeon as instructed. You do not need to follow up with anesthesia unless specifically instructed to do so.  WOUND CARE INSTRUCTIONS (if applicable):  Keep a dry clean dressing on the anesthesia/puncture wound site if there is drainage.  Once the wound has quit draining you may leave it open to air.  Generally you should leave the bandage intact for twenty four hours unless there is drainage.  If the epidural site drains for more than 36-48 hours please call the anesthesia department.  QUESTIONS?:  Please feel free to call your physician or the hospital operator if you have any questions, and they will be happy to assist you.

## 2015-07-21 ENCOUNTER — Other Ambulatory Visit: Payer: Self-pay

## 2015-07-21 ENCOUNTER — Encounter (HOSPITAL_COMMUNITY): Payer: Self-pay

## 2015-07-21 ENCOUNTER — Encounter (HOSPITAL_COMMUNITY)
Admission: RE | Admit: 2015-07-21 | Discharge: 2015-07-21 | Disposition: A | Discharge status: Home / Self Care | Payer: PPO | Source: Ambulatory Visit | Attending: Ophthalmology | Admitting: Ophthalmology

## 2015-07-21 DIAGNOSIS — Z01818 Encounter for other preprocedural examination: Secondary | ICD-10-CM | POA: Insufficient documentation

## 2015-07-21 DIAGNOSIS — H2511 Age-related nuclear cataract, right eye: Secondary | ICD-10-CM | POA: Insufficient documentation

## 2015-07-21 HISTORY — DX: Pure hypercholesterolemia, unspecified: E78.00

## 2015-07-21 HISTORY — DX: Benign prostatic hyperplasia without lower urinary tract symptoms: N40.0

## 2015-07-21 LAB — CBC WITH DIFFERENTIAL/PLATELET
BASOS PCT: 1 %
Basophils Absolute: 0.1 10*3/uL (ref 0.0–0.1)
EOS ABS: 0.4 10*3/uL (ref 0.0–0.7)
Eosinophils Relative: 4 %
HEMATOCRIT: 36.7 % — AB (ref 39.0–52.0)
Hemoglobin: 12.6 g/dL — ABNORMAL LOW (ref 13.0–17.0)
Lymphocytes Relative: 31 %
Lymphs Abs: 2.5 10*3/uL (ref 0.7–4.0)
MCH: 31.2 pg (ref 26.0–34.0)
MCHC: 34.3 g/dL (ref 30.0–36.0)
MCV: 90.8 fL (ref 78.0–100.0)
MONOS PCT: 6 %
Monocytes Absolute: 0.5 10*3/uL (ref 0.1–1.0)
NEUTROS ABS: 4.6 10*3/uL (ref 1.7–7.7)
Neutrophils Relative %: 58 %
Platelets: 205 10*3/uL (ref 150–400)
RBC: 4.04 MIL/uL — ABNORMAL LOW (ref 4.22–5.81)
RDW: 13.2 % (ref 11.5–15.5)
WBC: 8 10*3/uL (ref 4.0–10.5)

## 2015-07-21 LAB — BASIC METABOLIC PANEL
Anion gap: 4 — ABNORMAL LOW (ref 5–15)
BUN: 17 mg/dL (ref 6–20)
CALCIUM: 8.8 mg/dL — AB (ref 8.9–10.3)
CO2: 30 mmol/L (ref 22–32)
Chloride: 103 mmol/L (ref 101–111)
Creatinine, Ser: 0.9 mg/dL (ref 0.61–1.24)
GFR calc non Af Amer: >60 mL/min (ref >60)
Glucose, Bld: 94 mg/dL (ref 65–99)
Potassium: 4.6 mmol/L (ref 3.5–5.1)
Sodium: 137 mmol/L (ref 135–145)

## 2015-07-21 NOTE — Pre-Procedure Instructions (Signed)
Patient given information to sign up for my chart at home. 

## 2015-07-26 MED ORDER — PHENYLEPHRINE HCL 2.5 % OP SOLN
OPHTHALMIC | Status: AC
  Filled 2015-07-26: qty 15

## 2015-07-26 MED ORDER — KETOROLAC TROMETHAMINE 0.5 % OP SOLN
OPHTHALMIC | Status: AC
  Filled 2015-07-26: qty 5

## 2015-07-26 MED ORDER — TETRACAINE HCL 0.5 % OP SOLN
OPHTHALMIC | Status: AC
  Filled 2015-07-26: qty 2

## 2015-07-26 MED ORDER — CYCLOPENTOLATE-PHENYLEPHRINE OP SOLN OPTIME - NO CHARGE
OPHTHALMIC | Status: AC
  Filled 2015-07-26: qty 2

## 2015-07-27 ENCOUNTER — Encounter (HOSPITAL_COMMUNITY)
Admission: RE | Disposition: A | Discharge status: Home / Self Care | Payer: Self-pay | Source: Ambulatory Visit | Attending: Ophthalmology

## 2015-07-27 ENCOUNTER — Ambulatory Visit (HOSPITAL_COMMUNITY)
Admission: RE | Admit: 2015-07-27 | Discharge: 2015-07-27 | Disposition: A | Discharge status: Home / Self Care | Payer: PPO | Source: Ambulatory Visit | Attending: Ophthalmology | Admitting: Ophthalmology

## 2015-07-27 ENCOUNTER — Encounter (HOSPITAL_COMMUNITY): Payer: Self-pay | Admitting: *Deleted

## 2015-07-27 ENCOUNTER — Ambulatory Visit (HOSPITAL_COMMUNITY): Payer: PPO | Admitting: Anesthesiology

## 2015-07-27 DIAGNOSIS — H25011 Cortical age-related cataract, right eye: Secondary | ICD-10-CM | POA: Insufficient documentation

## 2015-07-27 DIAGNOSIS — K219 Gastro-esophageal reflux disease without esophagitis: Secondary | ICD-10-CM | POA: Insufficient documentation

## 2015-07-27 DIAGNOSIS — Z791 Long term (current) use of non-steroidal anti-inflammatories (NSAID): Secondary | ICD-10-CM | POA: Diagnosis not present

## 2015-07-27 DIAGNOSIS — Z7982 Long term (current) use of aspirin: Secondary | ICD-10-CM | POA: Diagnosis not present

## 2015-07-27 DIAGNOSIS — H2511 Age-related nuclear cataract, right eye: Secondary | ICD-10-CM | POA: Diagnosis present

## 2015-07-27 DIAGNOSIS — Z87891 Personal history of nicotine dependence: Secondary | ICD-10-CM | POA: Diagnosis not present

## 2015-07-27 DIAGNOSIS — M1990 Unspecified osteoarthritis, unspecified site: Secondary | ICD-10-CM | POA: Insufficient documentation

## 2015-07-27 DIAGNOSIS — Z79899 Other long term (current) drug therapy: Secondary | ICD-10-CM | POA: Diagnosis not present

## 2015-07-27 HISTORY — PX: CATARACT EXTRACTION W/PHACO: SHX586

## 2015-07-27 SURGERY — PHACOEMULSIFICATION, CATARACT, WITH IOL INSERTION
Anesthesia: Monitor Anesthesia Care | Site: Eye | Laterality: Right

## 2015-07-27 MED ORDER — MIDAZOLAM HCL 2 MG/2ML IJ SOLN
INTRAMUSCULAR | Status: AC
  Filled 2015-07-27: qty 2

## 2015-07-27 MED ORDER — CYCLOPENTOLATE-PHENYLEPHRINE 0.2-1 % OP SOLN
1.0000 [drp] | OPHTHALMIC | Status: AC
  Administered 2015-07-27 (×3): 1 [drp] via OPHTHALMIC

## 2015-07-27 MED ORDER — BSS IO SOLN
INTRAOCULAR | Status: DC | PRN
  Administered 2015-07-27: 15 mL

## 2015-07-27 MED ORDER — KETOROLAC TROMETHAMINE 0.5 % OP SOLN
1.0000 [drp] | OPHTHALMIC | Status: AC
  Administered 2015-07-27 (×2): 1 [drp] via OPHTHALMIC

## 2015-07-27 MED ORDER — EPINEPHRINE HCL 1 MG/ML IJ SOLN
INTRAMUSCULAR | Status: DC | PRN
  Administered 2015-07-27: 500 mL

## 2015-07-27 MED ORDER — LACTATED RINGERS IV SOLN
INTRAVENOUS | Status: DC
  Administered 2015-07-27: 11:00:00 via INTRAVENOUS

## 2015-07-27 MED ORDER — PROVISC 10 MG/ML IO SOLN
INTRAOCULAR | Status: DC | PRN
  Administered 2015-07-27: 0.85 mL via INTRAOCULAR

## 2015-07-27 MED ORDER — TETRACAINE HCL 0.5 % OP SOLN
1.0000 [drp] | OPHTHALMIC | Status: AC
  Administered 2015-07-27 (×3): 1 [drp] via OPHTHALMIC

## 2015-07-27 MED ORDER — FENTANYL CITRATE (PF) 100 MCG/2ML IJ SOLN
INTRAMUSCULAR | Status: AC
  Filled 2015-07-27: qty 2

## 2015-07-27 MED ORDER — EPINEPHRINE HCL 1 MG/ML IJ SOLN
INTRAMUSCULAR | Status: AC
  Filled 2015-07-27: qty 1

## 2015-07-27 MED ORDER — FENTANYL CITRATE (PF) 100 MCG/2ML IJ SOLN
25.0000 ug | INTRAMUSCULAR | Status: AC
  Administered 2015-07-27: 25 ug via INTRAVENOUS

## 2015-07-27 MED ORDER — PHENYLEPHRINE HCL 2.5 % OP SOLN
1.0000 [drp] | OPHTHALMIC | Status: AC
  Administered 2015-07-27 (×3): 1 [drp] via OPHTHALMIC

## 2015-07-27 MED ORDER — TETRACAINE 0.5 % OP SOLN OPTIME - NO CHARGE
OPHTHALMIC | Status: DC | PRN
  Administered 2015-07-27: 2 [drp] via OPHTHALMIC

## 2015-07-27 MED ORDER — MIDAZOLAM HCL 2 MG/2ML IJ SOLN
1.0000 mg | INTRAMUSCULAR | Status: DC | PRN
  Administered 2015-07-27: 2 mg via INTRAVENOUS

## 2015-07-27 SURGICAL SUPPLY — 11 items
CLOTH BEACON ORANGE TIMEOUT ST (SAFETY) ×2 IMPLANT
EYE SHIELD UNIVERSAL CLEAR (GAUZE/BANDAGES/DRESSINGS) ×2 IMPLANT
GLOVE BIO SURGEON STRL SZ 6.5 (GLOVE) ×1 IMPLANT
GLOVE BIO SURGEONS STRL SZ 6.5 (GLOVE) ×1
GLOVE EXAM NITRILE MD LF STRL (GLOVE) ×2 IMPLANT
LENS ALC ACRYL/TECN (Ophthalmic Related) ×3 IMPLANT
PAD ARMBOARD 7.5X6 YLW CONV (MISCELLANEOUS) ×2 IMPLANT
RING MALYGIN (MISCELLANEOUS) ×2 IMPLANT
TAPE SURG TRANSPORE 1 IN (GAUZE/BANDAGES/DRESSINGS) IMPLANT
TAPE SURGICAL TRANSPORE 1 IN (GAUZE/BANDAGES/DRESSINGS) ×2
WATER STERILE IRR 250ML POUR (IV SOLUTION) ×2 IMPLANT

## 2015-07-27 NOTE — Discharge Instructions (Signed)
Kevin Matthews  07/27/2015           [facility] Instructions [place] [address]      1. Avoid closing eyes tightly. One often closes the eye tightly when laughing, talking, sneezing, coughing or if they feel irritated. At these times, you should be careful not to close your eyes tightly.  2. Instill eye drops as instructed. To instill drops in your eye, open it, look up and have someone gently pull the lower lid down and instill a couple of drops inside the lower lid.  3. Do not touch upper lid.  4. Take Advil or Tylenol for pain.  5. You may use either eye for near work, such as reading or sewing and you may watch television.  6. You may have your hair done at the beauty parlor at any time.  7. Wear dark glasses with or without your own glasses if you are in bright light.  8. Call our office at [phone] or [phone] if you have sharp pain in your eye or unusual symptoms.  9. Do not be concerned because vision in the operative eye is not good. It will not be good, no matter how successful the operation, until you get a special lens for it. Your old glasses will not be suited to the new eye that was operated on and you will not be ready for a new lens for about a month.  10. Follow up at the [facility] office.    I have received a copy of the above instructions and will follow them.     Follow up with Dr. Gershon Crane today between 2-3 PM at the office.

## 2015-07-27 NOTE — Transfer of Care (Signed)
Immediate Anesthesia Transfer of Care Note  Patient: Kevin Matthews  Procedure(s) Performed: Procedure(s) with comments: CATARACT EXTRACTION PHACO AND INTRAOCULAR LENS PLACEMENT (IOC) (Right) - CDE:9.43  Patient Location: Short Stay  Anesthesia Type:MAC  Level of Consciousness: awake  Airway & Oxygen Therapy: Patient Spontanous Breathing  Post-op Assessment: Report given to RN  Post vital signs: Reviewed  Last Vitals: There were no vitals filed for this visit.  Complications: No apparent anesthesia complications

## 2015-07-27 NOTE — H&P (Signed)
The patient was re examined and there is no change in the patients condition since the original H and P. 

## 2015-07-27 NOTE — Op Note (Signed)
Patient brought to the operating room and prepped and draped in the usual manner.  Lid speculum inserted in right eye.  Stab incision made at the twelve o'clock position.  Provisc instilled in the anterior chamber.   A 2.4 mm. Stab incision was made temporally.  Due to a small pupil, a Malugyn Ring was inserted.  An anterior capsulotomy was done with a bent 25 gauge needle.  The nucleus was hydrodissected.  The Phaco tip was inserted in the anterior chamber and the nucleus was emulsified.  CDE was 9.43.  The cortical material was then removed with the I and A tip.  Posterior capsule was the polished.  The anterior chamber was deepened with Provisc.  A 22.0 Diopter Alcon SN60WF IOL was then inserted in the capsular bag.  The Malugyn Ring was removed.  Provisc was then removed with the I and A tip.  The wound was then hydrated.  Patient sent to the Recovery Room in good condition with follow up in my office.  Preoperative Diagnosis:  Cortical and Nuclear Cataract OD Postoperative Diagnosis:  Same Procedure name: Kelman Phacoemulsification OD with IOL

## 2015-07-27 NOTE — Anesthesia Preprocedure Evaluation (Signed)
Anesthesia Evaluation  Patient identified by MRN, date of birth, ID band Patient awake    Reviewed: Allergy & Precautions, H&P , NPO status , Patient's Chart, lab work & pertinent test results  Airway Mallampati: III  TM Distance: >3 FB Neck ROM: full    Dental  (+) Teeth Intact   Pulmonary pneumonia, resolved, former smoker,    breath sounds clear to auscultation       Cardiovascular + Peripheral Vascular Disease (carotid endarterectomy)  + Valvular Problems/Murmurs  Rhythm:Regular Rate:Normal     Neuro/Psych    GI/Hepatic negative GI ROS, GERD  Medicated and Controlled,  Endo/Other    Renal/GU      Musculoskeletal  (+) Arthritis ,   Abdominal   Peds  Hematology   Anesthesia Other Findings   Reproductive/Obstetrics                             Anesthesia Physical Anesthesia Plan  ASA: III  Anesthesia Plan: MAC   Post-op Pain Management:    Induction: Intravenous  Airway Management Planned: Nasal Cannula  Additional Equipment:   Intra-op Plan:   Post-operative Plan:   Informed Consent: I have reviewed the patients History and Physical, chart, labs and discussed the procedure including the risks, benefits and alternatives for the proposed anesthesia with the patient or authorized representative who has indicated his/her understanding and acceptance.     Plan Discussed with:   Anesthesia Plan Comments:         Anesthesia Quick Evaluation

## 2015-07-27 NOTE — Anesthesia Postprocedure Evaluation (Signed)
  Anesthesia Post-op Note  Patient: Kevin Matthews  Procedure(s) Performed: Procedure(s) with comments: CATARACT EXTRACTION PHACO AND INTRAOCULAR LENS PLACEMENT (IOC) (Right) - CDE:9.43  Patient Location: Short Stay  Anesthesia Type:MAC  Level of Consciousness: awake, alert  and oriented  Airway and Oxygen Therapy: Patient Spontanous Breathing  Post-op Pain: none  Post-op Assessment: Post-op Vital signs reviewed, Patient's Cardiovascular Status Stable, Respiratory Function Stable, Patent Airway and No signs of Nausea or vomiting              Post-op Vital Signs: Reviewed and stable  Last Vitals: There were no vitals filed for this visit.  Complications: No apparent anesthesia complications

## 2015-07-28 ENCOUNTER — Encounter (HOSPITAL_COMMUNITY): Payer: Self-pay | Admitting: Ophthalmology

## 2015-08-06 ENCOUNTER — Encounter (HOSPITAL_COMMUNITY)
Admission: RE | Admit: 2015-08-06 | Discharge: 2015-08-06 | Disposition: A | Discharge status: Home / Self Care | Payer: PPO | Source: Ambulatory Visit | Attending: Ophthalmology | Admitting: Ophthalmology

## 2015-08-06 ENCOUNTER — Encounter (HOSPITAL_COMMUNITY): Payer: Self-pay

## 2015-08-09 MED ORDER — CYCLOPENTOLATE-PHENYLEPHRINE OP SOLN OPTIME - NO CHARGE
OPHTHALMIC | Status: AC
  Filled 2015-08-09: qty 2

## 2015-08-09 MED ORDER — KETOROLAC TROMETHAMINE 0.5 % OP SOLN
OPHTHALMIC | Status: AC
  Filled 2015-08-09: qty 5

## 2015-08-09 MED ORDER — TETRACAINE HCL 0.5 % OP SOLN
OPHTHALMIC | Status: AC
  Filled 2015-08-09: qty 2

## 2015-08-09 MED ORDER — PHENYLEPHRINE HCL 2.5 % OP SOLN
OPHTHALMIC | Status: AC
  Filled 2015-08-09: qty 15

## 2015-08-10 ENCOUNTER — Ambulatory Visit (HOSPITAL_COMMUNITY): Payer: PPO | Admitting: Anesthesiology

## 2015-08-10 ENCOUNTER — Ambulatory Visit (HOSPITAL_COMMUNITY)
Admission: RE | Admit: 2015-08-10 | Discharge: 2015-08-10 | Disposition: A | Discharge status: Home / Self Care | Payer: PPO | Source: Ambulatory Visit | Attending: Ophthalmology | Admitting: Ophthalmology

## 2015-08-10 ENCOUNTER — Encounter (HOSPITAL_COMMUNITY)
Admission: RE | Disposition: A | Discharge status: Home / Self Care | Payer: Self-pay | Source: Ambulatory Visit | Attending: Ophthalmology

## 2015-08-10 DIAGNOSIS — K219 Gastro-esophageal reflux disease without esophagitis: Secondary | ICD-10-CM | POA: Insufficient documentation

## 2015-08-10 DIAGNOSIS — H2512 Age-related nuclear cataract, left eye: Secondary | ICD-10-CM | POA: Insufficient documentation

## 2015-08-10 DIAGNOSIS — Z7982 Long term (current) use of aspirin: Secondary | ICD-10-CM | POA: Diagnosis not present

## 2015-08-10 DIAGNOSIS — M1990 Unspecified osteoarthritis, unspecified site: Secondary | ICD-10-CM | POA: Insufficient documentation

## 2015-08-10 DIAGNOSIS — Z87891 Personal history of nicotine dependence: Secondary | ICD-10-CM | POA: Diagnosis not present

## 2015-08-10 DIAGNOSIS — H25012 Cortical age-related cataract, left eye: Secondary | ICD-10-CM | POA: Insufficient documentation

## 2015-08-10 DIAGNOSIS — Z791 Long term (current) use of non-steroidal anti-inflammatories (NSAID): Secondary | ICD-10-CM | POA: Insufficient documentation

## 2015-08-10 HISTORY — PX: CATARACT EXTRACTION W/PHACO: SHX586

## 2015-08-10 SURGERY — PHACOEMULSIFICATION, CATARACT, WITH IOL INSERTION
Anesthesia: Monitor Anesthesia Care | Site: Eye | Laterality: Left

## 2015-08-10 MED ORDER — KETOROLAC TROMETHAMINE 0.5 % OP SOLN
1.0000 [drp] | OPHTHALMIC | Status: AC
  Administered 2015-08-10 (×3): 1 [drp] via OPHTHALMIC

## 2015-08-10 MED ORDER — MIDAZOLAM HCL 2 MG/2ML IJ SOLN
1.0000 mg | INTRAMUSCULAR | Status: DC | PRN
Start: 2015-08-10 — End: 2015-08-10
  Administered 2015-08-10: 2 mg via INTRAVENOUS

## 2015-08-10 MED ORDER — TETRACAINE HCL 0.5 % OP SOLN
1.0000 [drp] | OPHTHALMIC | Status: AC
  Administered 2015-08-10 (×3): 1 [drp] via OPHTHALMIC

## 2015-08-10 MED ORDER — MIDAZOLAM HCL 2 MG/2ML IJ SOLN
INTRAMUSCULAR | Status: AC
  Filled 2015-08-10: qty 2

## 2015-08-10 MED ORDER — FENTANYL CITRATE (PF) 100 MCG/2ML IJ SOLN
INTRAMUSCULAR | Status: AC
  Filled 2015-08-10: qty 2

## 2015-08-10 MED ORDER — LACTATED RINGERS IV SOLN
INTRAVENOUS | Status: DC
  Administered 2015-08-10: 1000 mL via INTRAVENOUS

## 2015-08-10 MED ORDER — BSS IO SOLN
INTRAOCULAR | Status: DC | PRN
  Administered 2015-08-10: 15 mL

## 2015-08-10 MED ORDER — EPINEPHRINE HCL 1 MG/ML IJ SOLN
INTRAMUSCULAR | Status: AC
  Filled 2015-08-10: qty 1

## 2015-08-10 MED ORDER — PHENYLEPHRINE HCL 2.5 % OP SOLN
1.0000 [drp] | OPHTHALMIC | Status: AC
  Administered 2015-08-10 (×3): 1 [drp] via OPHTHALMIC

## 2015-08-10 MED ORDER — TETRACAINE 0.5 % OP SOLN OPTIME - NO CHARGE
OPHTHALMIC | Status: DC | PRN
  Administered 2015-08-10: 2 [drp] via OPHTHALMIC

## 2015-08-10 MED ORDER — PROVISC 10 MG/ML IO SOLN
INTRAOCULAR | Status: DC | PRN
  Administered 2015-08-10: 0.85 mL via INTRAOCULAR

## 2015-08-10 MED ORDER — FENTANYL CITRATE (PF) 100 MCG/2ML IJ SOLN
25.0000 ug | INTRAMUSCULAR | Status: AC
  Administered 2015-08-10 (×2): 25 ug via INTRAVENOUS

## 2015-08-10 MED ORDER — CYCLOPENTOLATE-PHENYLEPHRINE 0.2-1 % OP SOLN
1.0000 [drp] | OPHTHALMIC | Status: AC
  Administered 2015-08-10 (×3): 1 [drp] via OPHTHALMIC

## 2015-08-10 MED ORDER — EPINEPHRINE HCL 1 MG/ML IJ SOLN
INTRAOCULAR | Status: DC | PRN
  Administered 2015-08-10: 500 mL

## 2015-08-10 SURGICAL SUPPLY — 10 items

## 2015-08-10 NOTE — Anesthesia Postprocedure Evaluation (Signed)
  Anesthesia Post-op Note  Patient: Kevin Matthews  Procedure(s) Performed: Procedure(s) (LRB): CATARACT EXTRACTION PHACO AND INTRAOCULAR LENS PLACEMENT (IOC) (Left)  Patient Location:  Short Stay  Anesthesia Type: MAC  Level of Consciousness: awake  Airway and Oxygen Therapy: Patient Spontanous Breathing  Post-op Pain: none  Post-op Assessment: Post-op Vital signs reviewed, Patient's Cardiovascular Status Stable, Respiratory Function Stable, Patent Airway, No signs of Nausea or vomiting and Pain level controlled  Post-op Vital Signs: Reviewed and stable  Complications: No apparent anesthesia complications

## 2015-08-10 NOTE — Discharge Instructions (Signed)
Kevin Matthews  08/10/2015           [facility] Instructions [place] [address]      1. Avoid closing eyes tightly. One often closes the eye tightly when laughing, talking, sneezing, coughing or if they feel irritated. At these times, you should be careful not to close your eyes tightly.  2. Instill eye drops as instructed. To instill drops in your eye, open it, look up and have someone gently pull the lower lid down and instill a couple of drops inside the lower lid.  3. Do not touch upper lid.  4. Take Advil or Tylenol for pain.  5. You may use either eye for near work, such as reading or sewing and you may watch television.  6. You may have your hair done at the beauty parlor at any time.  7. Wear dark glasses with or without your own glasses if you are in bright light.  8. Call our office at [phone] or [phone] if you have sharp pain in your eye or unusual symptoms.  9. Do not be concerned because vision in the operative eye is not good. It will not be good, no matter how successful the operation, until you get a special lens for it. Your old glasses will not be suited to the new eye that was operated on and you will not be ready for a new lens for about a month.  10. Follow up at the [facility] office.    I have received a copy of the above instructions and will follow them.

## 2015-08-10 NOTE — H&P (Signed)
The patient was re examined and there is no change in the patients condition since the original H and P. 

## 2015-08-10 NOTE — Op Note (Signed)
Patient brought to the operating room and prepped and draped in the usual manner.  Lid speculum inserted in left eye.  Stab incision made at the twelve o'clock position.  Provisc instilled in the anterior chamber.   A 2.4 mm. Stab incision was made temporally.  Due to a small pupil, a Malugyn Ring was inserted.  An anterior capsulotomy was done with a bent 25 gauge needle.  The nucleus was hydrodissected.  The Phaco tip was inserted in the anterior chamber and the nucleus was emulsified.  CDE was 8.27.  The cortical material was then removed with the I and A tip.  Posterior capsule was the polished.  The anterior chamber was deepened with Provisc.  A 22.0 Diopter Alcon SN60WF IOL was then inserted in the capsular bag.  The Malugyn Ring was removed. Provisc was then removed with the I and A tip.  The wound was then hydrated.  Patient sent to the Recovery Room in good condition with follow up in my office.  Preoperative Diagnosis: Cortical and  Nuclear Cataract OS Postoperative Diagnosis:  Same Procedure name: Kelman Phacoemulsification OS with IOL

## 2015-08-10 NOTE — Transfer of Care (Signed)
Immediate Anesthesia Transfer of Care Note  Patient: Kevin Matthews  Procedure(s) Performed: Procedure(s) (LRB): CATARACT EXTRACTION PHACO AND INTRAOCULAR LENS PLACEMENT (IOC) (Left)  Patient Location: Shortstay  Anesthesia Type: MAC  Level of Consciousness: awake  Airway & Oxygen Therapy: Patient Spontanous Breathing   Post-op Assessment: Report given to PACU RN, Post -op Vital signs reviewed and stable and Patient moving all extremities  Post vital signs: Reviewed and stable  Complications: No apparent anesthesia complications

## 2015-08-10 NOTE — Anesthesia Preprocedure Evaluation (Signed)
Anesthesia Evaluation  Patient identified by MRN, date of birth, ID band Patient awake    Reviewed: Allergy & Precautions, H&P , NPO status , Patient's Chart, lab work & pertinent test results  Airway Mallampati: III  TM Distance: >3 FB Neck ROM: full    Dental  (+) Teeth Intact   Pulmonary pneumonia, resolved, former smoker,    breath sounds clear to auscultation       Cardiovascular + Peripheral Vascular Disease (carotid endarterectomy)  + Valvular Problems/Murmurs  Rhythm:Regular Rate:Normal     Neuro/Psych    GI/Hepatic negative GI ROS, GERD  Medicated and Controlled,  Endo/Other    Renal/GU      Musculoskeletal  (+) Arthritis ,   Abdominal   Peds  Hematology   Anesthesia Other Findings   Reproductive/Obstetrics                             Anesthesia Physical Anesthesia Plan  ASA: III  Anesthesia Plan: MAC   Post-op Pain Management:    Induction: Intravenous  Airway Management Planned: Nasal Cannula  Additional Equipment:   Intra-op Plan:   Post-operative Plan:   Informed Consent: I have reviewed the patients History and Physical, chart, labs and discussed the procedure including the risks, benefits and alternatives for the proposed anesthesia with the patient or authorized representative who has indicated his/her understanding and acceptance.     Plan Discussed with:   Anesthesia Plan Comments:         Anesthesia Quick Evaluation

## 2015-08-10 NOTE — Anesthesia Procedure Notes (Signed)
Procedure Name: MAC Date/Time: 08/10/2015 7:49 AM Performed by: Vista Deck Pre-anesthesia Checklist: Patient identified, Emergency Drugs available, Suction available, Timeout performed and Patient being monitored Patient Re-evaluated:Patient Re-evaluated prior to inductionOxygen Delivery Method: Nasal Cannula

## 2015-08-12 ENCOUNTER — Encounter (HOSPITAL_COMMUNITY): Payer: Self-pay | Admitting: Ophthalmology

## 2016-01-07 DIAGNOSIS — Z1389 Encounter for screening for other disorder: Secondary | ICD-10-CM | POA: Diagnosis not present

## 2016-01-07 DIAGNOSIS — Z6825 Body mass index (BMI) 25.0-25.9, adult: Secondary | ICD-10-CM | POA: Diagnosis not present

## 2016-01-07 DIAGNOSIS — E663 Overweight: Secondary | ICD-10-CM | POA: Diagnosis not present

## 2016-01-07 DIAGNOSIS — Z0000 Encounter for general adult medical examination without abnormal findings: Secondary | ICD-10-CM | POA: Diagnosis not present

## 2016-08-10 DIAGNOSIS — B078 Other viral warts: Secondary | ICD-10-CM | POA: Diagnosis not present

## 2016-08-10 DIAGNOSIS — D0462 Carcinoma in situ of skin of left upper limb, including shoulder: Secondary | ICD-10-CM | POA: Diagnosis not present

## 2016-08-10 DIAGNOSIS — X32XXXD Exposure to sunlight, subsequent encounter: Secondary | ICD-10-CM | POA: Diagnosis not present

## 2016-08-10 DIAGNOSIS — Z85828 Personal history of other malignant neoplasm of skin: Secondary | ICD-10-CM | POA: Diagnosis not present

## 2016-08-10 DIAGNOSIS — L570 Actinic keratosis: Secondary | ICD-10-CM | POA: Diagnosis not present

## 2016-08-10 DIAGNOSIS — Z08 Encounter for follow-up examination after completed treatment for malignant neoplasm: Secondary | ICD-10-CM | POA: Diagnosis not present

## 2016-08-10 DIAGNOSIS — Z1283 Encounter for screening for malignant neoplasm of skin: Secondary | ICD-10-CM | POA: Diagnosis not present

## 2017-02-14 DIAGNOSIS — Z08 Encounter for follow-up examination after completed treatment for malignant neoplasm: Secondary | ICD-10-CM | POA: Diagnosis not present

## 2017-02-14 DIAGNOSIS — L570 Actinic keratosis: Secondary | ICD-10-CM | POA: Diagnosis not present

## 2017-02-14 DIAGNOSIS — Z85828 Personal history of other malignant neoplasm of skin: Secondary | ICD-10-CM | POA: Diagnosis not present

## 2017-02-14 DIAGNOSIS — L821 Other seborrheic keratosis: Secondary | ICD-10-CM | POA: Diagnosis not present

## 2017-02-14 DIAGNOSIS — X32XXXD Exposure to sunlight, subsequent encounter: Secondary | ICD-10-CM | POA: Diagnosis not present

## 2017-05-10 DIAGNOSIS — L821 Other seborrheic keratosis: Secondary | ICD-10-CM | POA: Diagnosis not present

## 2017-05-10 DIAGNOSIS — X32XXXD Exposure to sunlight, subsequent encounter: Secondary | ICD-10-CM | POA: Diagnosis not present

## 2017-05-10 DIAGNOSIS — L570 Actinic keratosis: Secondary | ICD-10-CM | POA: Diagnosis not present

## 2017-07-16 DIAGNOSIS — X32XXXD Exposure to sunlight, subsequent encounter: Secondary | ICD-10-CM | POA: Diagnosis not present

## 2017-07-16 DIAGNOSIS — C44319 Basal cell carcinoma of skin of other parts of face: Secondary | ICD-10-CM | POA: Diagnosis not present

## 2017-07-16 DIAGNOSIS — L570 Actinic keratosis: Secondary | ICD-10-CM | POA: Diagnosis not present

## 2017-09-28 DIAGNOSIS — E782 Mixed hyperlipidemia: Secondary | ICD-10-CM | POA: Diagnosis not present

## 2017-09-28 DIAGNOSIS — Z1389 Encounter for screening for other disorder: Secondary | ICD-10-CM | POA: Diagnosis not present

## 2017-09-28 DIAGNOSIS — E663 Overweight: Secondary | ICD-10-CM | POA: Diagnosis not present

## 2017-09-28 DIAGNOSIS — Z6825 Body mass index (BMI) 25.0-25.9, adult: Secondary | ICD-10-CM | POA: Diagnosis not present

## 2017-09-28 DIAGNOSIS — Z0001 Encounter for general adult medical examination with abnormal findings: Secondary | ICD-10-CM | POA: Diagnosis not present

## 2017-09-28 DIAGNOSIS — Z23 Encounter for immunization: Secondary | ICD-10-CM | POA: Diagnosis not present

## 2018-01-29 DIAGNOSIS — E782 Mixed hyperlipidemia: Secondary | ICD-10-CM | POA: Diagnosis not present

## 2018-03-28 DIAGNOSIS — C44529 Squamous cell carcinoma of skin of other part of trunk: Secondary | ICD-10-CM | POA: Diagnosis not present

## 2018-03-28 DIAGNOSIS — C4442 Squamous cell carcinoma of skin of scalp and neck: Secondary | ICD-10-CM | POA: Diagnosis not present

## 2018-03-28 DIAGNOSIS — Z85828 Personal history of other malignant neoplasm of skin: Secondary | ICD-10-CM | POA: Diagnosis not present

## 2018-03-28 DIAGNOSIS — Z08 Encounter for follow-up examination after completed treatment for malignant neoplasm: Secondary | ICD-10-CM | POA: Diagnosis not present

## 2018-03-28 DIAGNOSIS — X32XXXD Exposure to sunlight, subsequent encounter: Secondary | ICD-10-CM | POA: Diagnosis not present

## 2018-03-28 DIAGNOSIS — L570 Actinic keratosis: Secondary | ICD-10-CM | POA: Diagnosis not present

## 2018-04-05 DIAGNOSIS — Z87891 Personal history of nicotine dependence: Secondary | ICD-10-CM | POA: Diagnosis not present

## 2018-04-05 DIAGNOSIS — Z79899 Other long term (current) drug therapy: Secondary | ICD-10-CM | POA: Diagnosis not present

## 2018-04-05 DIAGNOSIS — Z885 Allergy status to narcotic agent status: Secondary | ICD-10-CM | POA: Diagnosis not present

## 2018-04-05 DIAGNOSIS — I63522 Cerebral infarction due to unspecified occlusion or stenosis of left anterior cerebral artery: Secondary | ICD-10-CM | POA: Diagnosis not present

## 2018-04-05 DIAGNOSIS — R001 Bradycardia, unspecified: Secondary | ICD-10-CM | POA: Diagnosis not present

## 2018-04-05 DIAGNOSIS — R531 Weakness: Secondary | ICD-10-CM | POA: Diagnosis not present

## 2018-04-05 DIAGNOSIS — R42 Dizziness and giddiness: Secondary | ICD-10-CM | POA: Diagnosis not present

## 2018-04-12 DIAGNOSIS — H8110 Benign paroxysmal vertigo, unspecified ear: Secondary | ICD-10-CM | POA: Diagnosis not present

## 2018-04-12 DIAGNOSIS — H903 Sensorineural hearing loss, bilateral: Secondary | ICD-10-CM | POA: Diagnosis not present

## 2018-04-12 DIAGNOSIS — J342 Deviated nasal septum: Secondary | ICD-10-CM | POA: Diagnosis not present

## 2018-04-29 DIAGNOSIS — Z08 Encounter for follow-up examination after completed treatment for malignant neoplasm: Secondary | ICD-10-CM | POA: Diagnosis not present

## 2018-04-29 DIAGNOSIS — Z85828 Personal history of other malignant neoplasm of skin: Secondary | ICD-10-CM | POA: Diagnosis not present

## 2018-07-09 DIAGNOSIS — Z0000 Encounter for general adult medical examination without abnormal findings: Secondary | ICD-10-CM | POA: Diagnosis not present

## 2018-07-09 DIAGNOSIS — Z1389 Encounter for screening for other disorder: Secondary | ICD-10-CM | POA: Diagnosis not present

## 2018-07-09 DIAGNOSIS — Z6824 Body mass index (BMI) 24.0-24.9, adult: Secondary | ICD-10-CM | POA: Diagnosis not present

## 2018-08-19 DIAGNOSIS — L570 Actinic keratosis: Secondary | ICD-10-CM | POA: Diagnosis not present

## 2018-08-19 DIAGNOSIS — X32XXXD Exposure to sunlight, subsequent encounter: Secondary | ICD-10-CM | POA: Diagnosis not present

## 2019-02-11 DIAGNOSIS — K219 Gastro-esophageal reflux disease without esophagitis: Secondary | ICD-10-CM | POA: Diagnosis not present

## 2019-02-11 DIAGNOSIS — Z79899 Other long term (current) drug therapy: Secondary | ICD-10-CM | POA: Diagnosis not present

## 2019-02-11 DIAGNOSIS — F322 Major depressive disorder, single episode, severe without psychotic features: Secondary | ICD-10-CM | POA: Diagnosis not present

## 2019-02-11 DIAGNOSIS — Z1389 Encounter for screening for other disorder: Secondary | ICD-10-CM | POA: Diagnosis not present

## 2019-02-11 DIAGNOSIS — R7303 Prediabetes: Secondary | ICD-10-CM | POA: Diagnosis not present

## 2019-02-11 DIAGNOSIS — Z0000 Encounter for general adult medical examination without abnormal findings: Secondary | ICD-10-CM | POA: Diagnosis not present

## 2019-02-11 DIAGNOSIS — G4733 Obstructive sleep apnea (adult) (pediatric): Secondary | ICD-10-CM | POA: Diagnosis not present

## 2019-02-11 DIAGNOSIS — Z23 Encounter for immunization: Secondary | ICD-10-CM | POA: Diagnosis not present

## 2019-02-11 DIAGNOSIS — G8929 Other chronic pain: Secondary | ICD-10-CM | POA: Diagnosis not present

## 2019-02-11 DIAGNOSIS — Z0001 Encounter for general adult medical examination with abnormal findings: Secondary | ICD-10-CM | POA: Diagnosis not present

## 2019-02-11 DIAGNOSIS — E7849 Other hyperlipidemia: Secondary | ICD-10-CM | POA: Diagnosis not present

## 2019-02-11 DIAGNOSIS — M1710 Unilateral primary osteoarthritis, unspecified knee: Secondary | ICD-10-CM | POA: Diagnosis not present

## 2019-02-11 DIAGNOSIS — Z681 Body mass index (BMI) 19 or less, adult: Secondary | ICD-10-CM | POA: Diagnosis not present

## 2019-02-11 DIAGNOSIS — I472 Ventricular tachycardia: Secondary | ICD-10-CM | POA: Diagnosis not present

## 2019-02-11 DIAGNOSIS — M545 Low back pain: Secondary | ICD-10-CM | POA: Diagnosis not present

## 2019-02-11 DIAGNOSIS — R0902 Hypoxemia: Secondary | ICD-10-CM | POA: Diagnosis not present

## 2019-02-11 DIAGNOSIS — I493 Ventricular premature depolarization: Secondary | ICD-10-CM | POA: Diagnosis not present

## 2019-02-11 DIAGNOSIS — M5136 Other intervertebral disc degeneration, lumbar region: Secondary | ICD-10-CM | POA: Diagnosis not present

## 2019-02-11 DIAGNOSIS — E7800 Pure hypercholesterolemia, unspecified: Secondary | ICD-10-CM | POA: Diagnosis not present

## 2019-04-07 DIAGNOSIS — L570 Actinic keratosis: Secondary | ICD-10-CM | POA: Diagnosis not present

## 2019-04-07 DIAGNOSIS — C44319 Basal cell carcinoma of skin of other parts of face: Secondary | ICD-10-CM | POA: Diagnosis not present

## 2019-04-07 DIAGNOSIS — X32XXXD Exposure to sunlight, subsequent encounter: Secondary | ICD-10-CM | POA: Diagnosis not present

## 2019-04-07 DIAGNOSIS — C44612 Basal cell carcinoma of skin of right upper limb, including shoulder: Secondary | ICD-10-CM | POA: Diagnosis not present

## 2019-05-08 DIAGNOSIS — Z08 Encounter for follow-up examination after completed treatment for malignant neoplasm: Secondary | ICD-10-CM | POA: Diagnosis not present

## 2019-05-08 DIAGNOSIS — X32XXXD Exposure to sunlight, subsequent encounter: Secondary | ICD-10-CM | POA: Diagnosis not present

## 2019-05-08 DIAGNOSIS — L570 Actinic keratosis: Secondary | ICD-10-CM | POA: Diagnosis not present

## 2019-05-08 DIAGNOSIS — Z85828 Personal history of other malignant neoplasm of skin: Secondary | ICD-10-CM | POA: Diagnosis not present

## 2019-12-28 DIAGNOSIS — E7849 Other hyperlipidemia: Secondary | ICD-10-CM | POA: Diagnosis not present

## 2019-12-28 DIAGNOSIS — G8929 Other chronic pain: Secondary | ICD-10-CM | POA: Diagnosis not present

## 2019-12-28 DIAGNOSIS — F4542 Pain disorder with related psychological factors: Secondary | ICD-10-CM | POA: Diagnosis not present

## 2020-01-29 DIAGNOSIS — L820 Inflamed seborrheic keratosis: Secondary | ICD-10-CM | POA: Diagnosis not present

## 2020-01-29 DIAGNOSIS — L570 Actinic keratosis: Secondary | ICD-10-CM | POA: Diagnosis not present

## 2020-01-29 DIAGNOSIS — X32XXXD Exposure to sunlight, subsequent encounter: Secondary | ICD-10-CM | POA: Diagnosis not present

## 2020-02-16 ENCOUNTER — Other Ambulatory Visit: Payer: Self-pay

## 2020-02-16 ENCOUNTER — Emergency Department (HOSPITAL_COMMUNITY)
Admission: EM | Admit: 2020-02-16 | Discharge: 2020-02-16 | Disposition: A | Discharge status: Home / Self Care | Payer: No Typology Code available for payment source | Attending: Emergency Medicine | Admitting: Emergency Medicine

## 2020-02-16 ENCOUNTER — Encounter (HOSPITAL_COMMUNITY): Payer: Self-pay | Admitting: *Deleted

## 2020-02-16 ENCOUNTER — Emergency Department (HOSPITAL_COMMUNITY): Payer: No Typology Code available for payment source

## 2020-02-16 DIAGNOSIS — M545 Low back pain, unspecified: Secondary | ICD-10-CM

## 2020-02-16 DIAGNOSIS — Z87891 Personal history of nicotine dependence: Secondary | ICD-10-CM | POA: Diagnosis not present

## 2020-02-16 DIAGNOSIS — Z79899 Other long term (current) drug therapy: Secondary | ICD-10-CM | POA: Insufficient documentation

## 2020-02-16 DIAGNOSIS — I714 Abdominal aortic aneurysm, without rupture: Secondary | ICD-10-CM | POA: Insufficient documentation

## 2020-02-16 DIAGNOSIS — Z7982 Long term (current) use of aspirin: Secondary | ICD-10-CM | POA: Diagnosis not present

## 2020-02-16 LAB — CBC WITH DIFFERENTIAL/PLATELET
Abs Immature Granulocytes: 0.02 10*3/uL (ref 0.00–0.07)
Basophils Absolute: 0.1 10*3/uL (ref 0.0–0.1)
Basophils Relative: 2 %
Eosinophils Absolute: 0.5 10*3/uL (ref 0.0–0.5)
Eosinophils Relative: 5 %
HCT: 40.3 % (ref 39.0–52.0)
Hemoglobin: 12.7 g/dL — ABNORMAL LOW (ref 13.0–17.0)
Immature Granulocytes: 0 %
Lymphocytes Relative: 32 %
Lymphs Abs: 2.8 10*3/uL (ref 0.7–4.0)
MCH: 28.3 pg (ref 26.0–34.0)
MCHC: 31.5 g/dL (ref 30.0–36.0)
MCV: 90 fL (ref 80.0–100.0)
Monocytes Absolute: 0.8 10*3/uL (ref 0.1–1.0)
Monocytes Relative: 10 %
Neutro Abs: 4.4 10*3/uL (ref 1.7–7.7)
Neutrophils Relative %: 51 %
Platelets: 253 10*3/uL (ref 150–400)
RBC: 4.48 MIL/uL (ref 4.22–5.81)
RDW: 14.2 % (ref 11.5–15.5)
WBC: 8.6 10*3/uL (ref 4.0–10.5)
nRBC: 0 % (ref 0.0–0.2)

## 2020-02-16 LAB — BASIC METABOLIC PANEL
Anion gap: 10 (ref 5–15)
BUN: 18 mg/dL (ref 8–23)
CO2: 27 mmol/L (ref 22–32)
Calcium: 9.6 mg/dL (ref 8.9–10.3)
Chloride: 99 mmol/L (ref 98–111)
Creatinine, Ser: 1.18 mg/dL (ref 0.61–1.24)
GFR calc Af Amer: >60 mL/min (ref >60)
GFR calc non Af Amer: 57 mL/min — ABNORMAL LOW (ref >60)
Glucose, Bld: 99 mg/dL (ref 70–99)
Potassium: 5 mmol/L (ref 3.5–5.1)
Sodium: 136 mmol/L (ref 135–145)

## 2020-02-16 MED ORDER — METHYLPREDNISOLONE SODIUM SUCC 125 MG IJ SOLR
125.0000 mg | Freq: Once | INTRAMUSCULAR | Status: AC
  Administered 2020-02-16: 125 mg via INTRAMUSCULAR
  Filled 2020-02-16: qty 2

## 2020-02-16 MED ORDER — IOHEXOL 350 MG/ML SOLN
100.0000 mL | Freq: Once | INTRAVENOUS | Status: AC | PRN
  Administered 2020-02-16: 15:00:00 100 mL via INTRAVENOUS

## 2020-02-16 MED ORDER — PREDNISONE 10 MG PO TABS: ORAL_TABLET | ORAL | 0 refills | Status: DC

## 2020-02-16 MED ORDER — KETOROLAC TROMETHAMINE 30 MG/ML IJ SOLN
30.0000 mg | Freq: Once | INTRAMUSCULAR | Status: AC
  Administered 2020-02-16: 30 mg via INTRAMUSCULAR
  Filled 2020-02-16: qty 1

## 2020-02-16 MED ORDER — HYDROMORPHONE HCL 1 MG/ML IJ SOLN
1.0000 mg | Freq: Once | INTRAMUSCULAR | Status: AC
  Administered 2020-02-16: 1 mg via INTRAMUSCULAR
  Filled 2020-02-16: qty 1

## 2020-02-16 NOTE — ED Triage Notes (Signed)
C/o back pain for the last 2 years, states he has a history of stable AAA and also has other issues causing back pain

## 2020-02-16 NOTE — ED Provider Notes (Signed)
[facility] EMERGENCY DEPARTMENT Provider Note   CSN: [id number] Arrival date & time: 02/16/20  1139     History Chief Complaint  Patient presents with  . Back Pain    Kevin Matthews is a 86 y.o. male.  The history is provided by the patient. No language interpreter was used.  Back Pain Location:  Lumbar spine Quality:  Aching and cramping Radiates to:  Does not radiate Pain severity:  Moderate Pain is:  Worse during the day Onset quality:  Gradual Duration:  2 days Timing:  Constant Progression:  Worsening Chronicity:  New Relieved by:  Nothing Worsened by:  Nothing Ineffective treatments:  None tried Associated symptoms: no numbness and no paresthesias    Pt complains of severe low back pain.  Pt reports he has chronic back pain but today he is having difficulty moving.  No new injury.  Pt took tylenol yesterday with some relief,   minimal relief today.    [facility]  Pt reports at 4.8cm on last evaluation  Past Medical History:  Diagnosis Date  . Arthritis   . BPH (benign prostatic hyperplasia)   . GERD (gastroesophageal reflux disease)   . Heart murmur    dr Feliz Beam  . Hypercholesteremia     Patient Active Problem List   Diagnosis Date Noted  . Toxic encephalopathy 01/15/2014  . CAP (community acquired pneumonia) 01/13/2014  . Altered mental status 01/13/2014  . Lumbar stenosis with neurogenic claudication 01/07/2014  . Lumbago 02/25/2013    Past Surgical History:  Procedure Laterality Date  . BACK SURGERY     x5 cervical plate;  Marland Kitchen CAROTID ENDARTERECTOMY Left   . CATARACT EXTRACTION W/PHACO Right 07/27/2015   Procedure: CATARACT EXTRACTION PHACO AND INTRAOCULAR LENS PLACEMENT (IOC);  Surgeon: Rutherford Guys, MD;  Location: [facility];  Service: Ophthalmology;  Laterality: Right;  CDE:9.43  . CATARACT EXTRACTION W/PHACO Left 08/10/2015   Procedure: CATARACT EXTRACTION PHACO AND INTRAOCULAR LENS PLACEMENT  (IOC);  Surgeon: Rutherford Guys, MD;  Location: [facility];  Service: Ophthalmology;  Laterality: Left;  CDE:8.27  . LUMBAR LAMINECTOMY/DECOMPRESSION MICRODISCECTOMY N/A 01/07/2014   Procedure: Lumbar three-four, Lumbar four-five redo laminectomy and foraminotomy;  Surgeon: Ophelia Charter, MD;  Location: [facility];  Service: Neurosurgery;  Laterality: N/A;  Lumbar three-four, Lumbar four-five redo laminectomy and foraminotomy  . TONSILLECTOMY    . VASCULAR SURGERY         History reviewed. No pertinent family history.  Social History   Tobacco Use  . Smoking status: Former Smoker    Packs/day: 2.00    Years: 32.00    Pack years: 64.00    Types: Cigarettes    Quit date: 07/21/1979    Years since quitting: 40.6  . Smokeless tobacco: Never Used  Substance Use Topics  . Alcohol use: Yes    Alcohol/week: 8.0 standard drinks    Types: 8 Cans of beer per week    Comment: weekly  . Drug use: No    Home Medications Prior to Admission medications   Medication Sig Start Date End Date Taking? Authorizing Provider  aspirin 325 MG tablet Take 325 mg by mouth daily.    [provider]  Cholecalciferol (VITAMIN D3) 2000 UNITS TABS Take 2,000 Units by mouth daily.    [provider]  gabapentin (NEURONTIN) 300 MG capsule Take 600 mg by mouth 3 (three) times daily.    [provider]  ibuprofen (ADVIL,MOTRIN) 200 MG tablet Take 600 mg by mouth daily as needed for moderate pain.     [provider]  loratadine (CLARITIN) 10 MG tablet Take 10 mg by mouth daily.    [provider]  Multiple Vitamin (MULTIVITAMIN) capsule Take 1 capsule by mouth daily.    [provider]  ofloxacin (OCUFLOX) 0.3 % ophthalmic solution Place 1 drop into the right eye 2 (two) times daily. 07/06/15   [provider]  omeprazole (PRILOSEC) 20 MG capsule Take 20 mg by mouth 2 (two) times daily before a meal.     [provider]  pravastatin (PRAVACHOL)  40 MG tablet Take 80 mg by mouth daily.    [provider]  PROLENSA 0.07 % SOLN Place 1 drop into the right eye 3 (three) times daily. 07/06/15   [provider]  tamsulosin (FLOMAX) 0.4 MG CAPS capsule Take 1 capsule (0.4 mg total) by mouth daily after supper. 01/15/14   Nita Sells, MD    Allergies    Oxycodone  Review of Systems   Review of Systems  Musculoskeletal: Positive for back pain.  Neurological: Negative for numbness and paresthesias.  All other systems reviewed and are negative.   Physical Exam Updated Vital Signs BP (!) 170/84   Pulse (!) 58   Temp 98 F (36.7 C) (Oral)   Resp 14   Ht 5\' 9"  (1.753 m)   Wt 74.8 kg   SpO2 95%   BMI 24.37 kg/m   Physical Exam Vitals and nursing note reviewed.  Constitutional:      Appearance: He is well-developed.  HENT:     Head: Normocephalic and atraumatic.  Eyes:     Conjunctiva/sclera: Conjunctivae normal.  Cardiovascular:     Rate and Rhythm: Normal rate and regular rhythm.     Heart sounds: No murmur.  Pulmonary:     Effort: Pulmonary effort is normal. No respiratory distress.     Breath sounds: Normal breath sounds.  Abdominal:     Palpations: Abdomen is soft.     Tenderness: There is no abdominal tenderness.  Musculoskeletal:     Cervical back: Neck supple.     Comments: Spinal stimulator right flank area, diffusely tender.   Skin:    General: Skin is warm and dry.  Neurological:     Mental Status: He is alert.     ED Results / Procedures / Treatments   Labs (all labs ordered are listed, but only abnormal results are displayed) Labs Reviewed  CBC WITH DIFFERENTIAL/PLATELET - Abnormal; Notable for the following components:      Result Value   Hemoglobin 12.7 (*)    All other components within normal limits  BASIC METABOLIC PANEL - Abnormal; Notable for the following components:   GFR calc non Af Amer 57 (*)    All other components within normal limits    EKG None   Radiology CT Angio Abd/Pel W and/or Wo Contrast  Result Date: 02/16/2020 CLINICAL DATA:  86 year old male with a history of abdominal aortic aneurysm and back pain for the past 2 days EXAM: CTA ABDOMEN AND PELVIS WITHOUT AND WITH CONTRAST TECHNIQUE: Multidetector CT imaging of the abdomen and pelvis was performed using the standard protocol during bolus administration of intravenous contrast. Multiplanar reconstructed images and MIPs were obtained and reviewed to evaluate the vascular anatomy. CONTRAST:  154mL OMNIPAQUE IOHEXOL 350 MG/ML SOLN COMPARISON:  Prior CT scan of the abdomen and pelvis 02/03/2011 FINDINGS: VASCULAR Aorta: Fusiform  aneurysmal dilation of the infrarenal abdominal aorta with a maximal diameter of 4.7 cm. This has significantly increased compared to a maximum of 3.4 cm in April of 2012. Peripheral wall adherent mural thrombus is present. Additional areas of scattered heterogeneous atherosclerotic plaque are visualized throughout the aorta. There is a focal penetrating atherosclerotic ulcer in the distal most descending thoracic aorta at the aortic hiatus which measures approximately 1.1 x 0.7 cm. No findings concerning for active or impending perforation. Celiac: Patent without evidence of aneurysm, dissection, vasculitis or significant stenosis. SMA: Calcified atherosclerotic plaque at the origin results in a moderate focal stenosis. The artery remains patent. Renals: Solitary right renal artery. Heavily calcified atherosclerotic plaque results in at least moderate stenosis of the origin and proximal right renal artery. On the left, heterogeneous atherosclerotic plaque results in at least moderate focal stenosis at the origin of the dominant renal artery. There is a small accessory renal artery to the left lower pole. This artery arises from the aneurysmal portion of the abdominal aorta. IMA: Occluded at the origin. The artery reconstitutes via collateral flow. Inflow: Aneurysmal dilation  of the right common iliac artery to a maximal diameter of 2.0 cm. Additionally, there is a focal penetrating atherosclerotic ulcer arising from the posterior wall resulting in a maximal diameter of 2.1 cm just proximal to the bifurcation. The internal iliac artery remains patent. The external iliac artery is patent and relatively spared from disease. On the left, mild heterogeneous atherosclerotic plaque throughout the iliac system without aneurysm, dissection or significant stenosis. Proximal Outflow: Calcified atherosclerotic plaque along the posterior walls of the common femoral arteries. No aneurysm, dissection or significant appearing stenosis. Veins: No focal venous abnormality. Review of the MIP images confirms the above findings. NON-VASCULAR Lower chest: The visualized cardiac structures are normal in size. There is borderline left ventricular dilatation. Calcifications visualized along the coronary arteries. No pericardial effusion. Positive for a small hiatal hernia. No suspicious pulmonary mass or nodules. Hepatobiliary: Normal hepatic contour and morphology. No discrete hepatic lesions. Normal appearance of the gallbladder. No intra or extrahepatic biliary ductal dilatation. Pancreas: Unremarkable. No pancreatic ductal dilatation or surrounding inflammatory changes. Spleen: Normal in size without focal abnormality. Adrenals/Urinary Tract: Normal adrenal glands. The kidneys are normally perfused. No enhancing renal mass, nephrolithiasis or hydronephrosis. Unremarkable ureters and bladder. Stomach/Bowel: Colonic diverticular disease without CT evidence of active inflammation. No focal bowel wall thickening or evidence of obstruction. The stomach and duodenum demonstrate no acute findings. Lymphatic: No suspicious lymphadenopathy. Reproductive: Prostate is unremarkable. Other: Small fat containing midline ventral hernia just superior to the umbilicus. No evidence of ascites. No significant inguinal hernia.  Musculoskeletal: Thoracic epidural spinal stimulator in place. Multilevel degenerative disc disease with endplate spurring. No lytic or blastic lesion identified. No evidence of acute fracture or malalignment. Mild levoconvex scoliosis centered at L3-L4. IMPRESSION: VASCULAR 1. Enlarging 4.7 cm fusiform infrarenal abdominal aortic aneurysm when compared to prior imaging from 2012. Recommend followup by abdomen and pelvis CTA in 6 months, and vascular surgery referral/consultation if not already obtained. This recommendation follows ACR consensus guidelines: White Paper of the ACR Incidental Findings Committee II on Vascular Findings. J Am Coll Radiol 2013; 10:789-794. Aortic aneurysm NOS (ICD10-I71.9); Aortic Atherosclerosis (ICD10-I70.0). 2. Small accessory renal artery to the left lower pole arising from the diseased segment of the aorta. 3. Moderate focal stenosis of the origin of the superior mesenteric artery. 4. Moderate focal stenoses of the origins of the bilateral main renal arteries. 5. Mild aneurysmal dilation  of the right common iliac artery to 2.1 cm. 6. Extensive heterogeneous atherosclerotic plaque throughout the abdominal aorta and iliac arteries. Multiple areas of focal atherosclerotic ulceration are present as described above. NON-VASCULAR 1. No acute abnormality within the abdomen or pelvis to explain the patient's clinical symptoms. 2. Multilevel degenerative disc disease, facet arthropathy and levoconvex scoliosis could represent a source of chronic back pain. 3. Thoracic epidural spinal stimulator in place. 4. Small fat containing midline ventral hernia just superior to the umbilicus. 5. Colonic diverticular disease without CT evidence of active inflammation. 6. Small hiatal hernia. Signed, Criselda Peaches, MD, [facility] Rad[facility] Electronically Signed   By: Jacqulynn Cadet M.D.   On: 02/16/2020 15:08    Procedures Procedures  (including critical care time)  Medications Ordered in ED Medications  iohexol (OMNIPAQUE) 350 MG/ML injection 100 mL (100 mLs Intravenous Contrast Given 02/16/20 1434)  HYDROmorphone (DILAUDID) injection 1 mg (1 mg Intramuscular Given 02/16/20 1700)  ketorolac (TORADOL) 30 MG/ML injection 30 mg (30 mg Intramuscular Given 02/16/20 1657)  methylPREDNISolone sodium succinate (SOLU-MEDROL) 125 mg/2 mL injection 125 mg (125 mg Intramuscular Given 02/16/20 1655)    ED Course  I have reviewed the triage vital signs and the nursing notes.  Pertinent labs & imaging results that were available during my care of the patient were reviewed by me and considered in my medical decision making (see chart for details).    MDM Rules/Calculators/A&P                     MDM Number of Diagnoses or Management Options   Amount and/or Complexity of Data Reviewed Clinical lab tests: ordered and reviewed Tests in the radiology section of CPT: ordered and reviewed Tests in the medicine section of CPT: ordered and reviewed Decide to obtain previous medical records or to obtain history from someone other than the patient: yes Obtain history from someone other than the patient: yes Review and summarize past medical records: yes Discuss the patient with other providers: no Independent visualization of images, tracings, or specimens: yes  Risk of Complications, Morbidity, and/or Mortality Presenting problems: high Diagnostic procedures: moderate Management options: moderate  Pt's reviewed with pt.  Pt given injection of toradol, solumedrol and dilaudid.  Final Clinical Impression(s) / ED Diagnoses Final diagnoses:  Acute right-sided low back pain without sciatica    Rx / DC Orders ED Discharge Orders         Ordered    predniSONE (DELTASONE) 10 MG tablet     02/16/20 1718        An After Visit Summary was printed and given to the patient.    Fransico Meadow, [place] 02/16/20 1719    Virgel Manifold, MD 02/18/20 [phone]

## 2020-02-16 NOTE — Discharge Instructions (Signed)
Follow up with your Physician for recheck.  Return if any problems.  

## 2020-03-03 DIAGNOSIS — M48061 Spinal stenosis, lumbar region without neurogenic claudication: Secondary | ICD-10-CM | POA: Diagnosis not present

## 2020-03-03 DIAGNOSIS — K219 Gastro-esophageal reflux disease without esophagitis: Secondary | ICD-10-CM | POA: Diagnosis not present

## 2020-03-03 DIAGNOSIS — E7849 Other hyperlipidemia: Secondary | ICD-10-CM | POA: Diagnosis not present

## 2020-03-03 DIAGNOSIS — Z0001 Encounter for general adult medical examination with abnormal findings: Secondary | ICD-10-CM | POA: Diagnosis not present

## 2020-03-03 DIAGNOSIS — Z6824 Body mass index (BMI) 24.0-24.9, adult: Secondary | ICD-10-CM | POA: Diagnosis not present

## 2020-03-03 DIAGNOSIS — Z1389 Encounter for screening for other disorder: Secondary | ICD-10-CM | POA: Diagnosis not present

## 2020-03-29 DIAGNOSIS — E7849 Other hyperlipidemia: Secondary | ICD-10-CM | POA: Diagnosis not present

## 2020-03-29 DIAGNOSIS — F4542 Pain disorder with related psychological factors: Secondary | ICD-10-CM | POA: Diagnosis not present

## 2020-03-29 DIAGNOSIS — G8929 Other chronic pain: Secondary | ICD-10-CM | POA: Diagnosis not present

## 2020-05-28 DIAGNOSIS — G8929 Other chronic pain: Secondary | ICD-10-CM | POA: Diagnosis not present

## 2020-05-28 DIAGNOSIS — E7849 Other hyperlipidemia: Secondary | ICD-10-CM | POA: Diagnosis not present

## 2020-05-28 DIAGNOSIS — F4542 Pain disorder with related psychological factors: Secondary | ICD-10-CM | POA: Diagnosis not present

## 2020-07-29 ENCOUNTER — Other Ambulatory Visit: Payer: Self-pay

## 2020-07-29 ENCOUNTER — Inpatient Hospital Stay (HOSPITAL_COMMUNITY)
Admission: EM | Admit: 2020-07-29 | Discharge: 2020-07-31 | DRG: 065 | Disposition: A | Discharge status: Home / Self Care | Payer: No Typology Code available for payment source | Attending: Internal Medicine | Admitting: Internal Medicine

## 2020-07-29 ENCOUNTER — Emergency Department (HOSPITAL_COMMUNITY): Payer: No Typology Code available for payment source

## 2020-07-29 ENCOUNTER — Encounter (HOSPITAL_COMMUNITY): Payer: Self-pay | Admitting: Emergency Medicine

## 2020-07-29 ENCOUNTER — Inpatient Hospital Stay (HOSPITAL_COMMUNITY): Payer: No Typology Code available for payment source

## 2020-07-29 DIAGNOSIS — I639 Cerebral infarction, unspecified: Secondary | ICD-10-CM | POA: Diagnosis present

## 2020-07-29 DIAGNOSIS — G8194 Hemiplegia, unspecified affecting left nondominant side: Secondary | ICD-10-CM | POA: Diagnosis present

## 2020-07-29 DIAGNOSIS — G8929 Other chronic pain: Secondary | ICD-10-CM | POA: Diagnosis present

## 2020-07-29 DIAGNOSIS — I6389 Other cerebral infarction: Secondary | ICD-10-CM | POA: Diagnosis not present

## 2020-07-29 DIAGNOSIS — R29700 NIHSS score 0: Secondary | ICD-10-CM | POA: Diagnosis present

## 2020-07-29 DIAGNOSIS — Z79899 Other long term (current) drug therapy: Secondary | ICD-10-CM

## 2020-07-29 DIAGNOSIS — R509 Fever, unspecified: Secondary | ICD-10-CM | POA: Diagnosis present

## 2020-07-29 DIAGNOSIS — R011 Cardiac murmur, unspecified: Secondary | ICD-10-CM | POA: Diagnosis present

## 2020-07-29 DIAGNOSIS — E785 Hyperlipidemia, unspecified: Secondary | ICD-10-CM | POA: Diagnosis present

## 2020-07-29 DIAGNOSIS — Z20822 Contact with and (suspected) exposure to covid-19: Secondary | ICD-10-CM | POA: Diagnosis present

## 2020-07-29 DIAGNOSIS — J309 Allergic rhinitis, unspecified: Secondary | ICD-10-CM | POA: Diagnosis present

## 2020-07-29 DIAGNOSIS — Z7952 Long term (current) use of systemic steroids: Secondary | ICD-10-CM

## 2020-07-29 DIAGNOSIS — I340 Nonrheumatic mitral (valve) insufficiency: Secondary | ICD-10-CM | POA: Diagnosis not present

## 2020-07-29 DIAGNOSIS — M549 Dorsalgia, unspecified: Secondary | ICD-10-CM | POA: Diagnosis present

## 2020-07-29 DIAGNOSIS — I10 Essential (primary) hypertension: Secondary | ICD-10-CM | POA: Diagnosis present

## 2020-07-29 DIAGNOSIS — R4701 Aphasia: Secondary | ICD-10-CM | POA: Diagnosis present

## 2020-07-29 DIAGNOSIS — M1990 Unspecified osteoarthritis, unspecified site: Secondary | ICD-10-CM | POA: Diagnosis present

## 2020-07-29 DIAGNOSIS — Z885 Allergy status to narcotic agent status: Secondary | ICD-10-CM | POA: Diagnosis not present

## 2020-07-29 DIAGNOSIS — Z7982 Long term (current) use of aspirin: Secondary | ICD-10-CM | POA: Diagnosis not present

## 2020-07-29 DIAGNOSIS — Z87891 Personal history of nicotine dependence: Secondary | ICD-10-CM | POA: Diagnosis not present

## 2020-07-29 DIAGNOSIS — F4542 Pain disorder with related psychological factors: Secondary | ICD-10-CM | POA: Diagnosis not present

## 2020-07-29 DIAGNOSIS — H538 Other visual disturbances: Secondary | ICD-10-CM | POA: Diagnosis present

## 2020-07-29 DIAGNOSIS — K219 Gastro-esophageal reflux disease without esophagitis: Secondary | ICD-10-CM | POA: Diagnosis present

## 2020-07-29 DIAGNOSIS — N400 Enlarged prostate without lower urinary tract symptoms: Secondary | ICD-10-CM

## 2020-07-29 DIAGNOSIS — E7849 Other hyperlipidemia: Secondary | ICD-10-CM | POA: Diagnosis not present

## 2020-07-29 LAB — RESPIRATORY PANEL BY RT PCR (FLU A&B, COVID)
Influenza A by PCR: NEGATIVE
Influenza B by PCR: NEGATIVE
SARS Coronavirus 2 by RT PCR: NEGATIVE

## 2020-07-29 LAB — RAPID URINE DRUG SCREEN, HOSP PERFORMED
Amphetamines: NOT DETECTED
Barbiturates: NOT DETECTED
Benzodiazepines: NOT DETECTED
Cocaine: NOT DETECTED
Opiates: NOT DETECTED
Tetrahydrocannabinol: NOT DETECTED

## 2020-07-29 LAB — URINALYSIS, ROUTINE W REFLEX MICROSCOPIC
Bilirubin Urine: NEGATIVE
Glucose, UA: NEGATIVE mg/dL
Hgb urine dipstick: NEGATIVE
Ketones, ur: NEGATIVE mg/dL
Leukocytes,Ua: NEGATIVE
Nitrite: NEGATIVE
Protein, ur: NEGATIVE mg/dL
Specific Gravity, Urine: 1.01 (ref 1.005–1.030)
pH: 6 (ref 5.0–8.0)

## 2020-07-29 LAB — DIFFERENTIAL
Abs Immature Granulocytes: 0.06 10*3/uL (ref 0.00–0.07)
Basophils Absolute: 0.1 10*3/uL (ref 0.0–0.1)
Basophils Relative: 0 %
Eosinophils Absolute: 0.4 10*3/uL (ref 0.0–0.5)
Eosinophils Relative: 3 %
Immature Granulocytes: 1 %
Lymphocytes Relative: 24 %
Lymphs Abs: 2.9 10*3/uL (ref 0.7–4.0)
Monocytes Absolute: 1.2 10*3/uL — ABNORMAL HIGH (ref 0.1–1.0)
Monocytes Relative: 10 %
Neutro Abs: 7.6 10*3/uL (ref 1.7–7.7)
Neutrophils Relative %: 62 %

## 2020-07-29 LAB — CBC
HCT: 34.9 % — ABNORMAL LOW (ref 39.0–52.0)
Hemoglobin: 11.1 g/dL — ABNORMAL LOW (ref 13.0–17.0)
MCH: 28.6 pg (ref 26.0–34.0)
MCHC: 31.8 g/dL (ref 30.0–36.0)
MCV: 89.9 fL (ref 80.0–100.0)
Platelets: 243 10*3/uL (ref 150–400)
RBC: 3.88 MIL/uL — ABNORMAL LOW (ref 4.22–5.81)
RDW: 14.7 % (ref 11.5–15.5)
WBC: 12.2 10*3/uL — ABNORMAL HIGH (ref 4.0–10.5)
nRBC: 0 % (ref 0.0–0.2)

## 2020-07-29 LAB — COMPREHENSIVE METABOLIC PANEL
ALT: 15 U/L (ref 0–44)
AST: 36 U/L (ref 15–41)
Albumin: 3.3 g/dL — ABNORMAL LOW (ref 3.5–5.0)
Alkaline Phosphatase: 69 U/L (ref 38–126)
Anion gap: 12 (ref 5–15)
BUN: 16 mg/dL (ref 8–23)
CO2: 27 mmol/L (ref 22–32)
Calcium: 9 mg/dL (ref 8.9–10.3)
Chloride: 96 mmol/L — ABNORMAL LOW (ref 98–111)
Creatinine, Ser: 1.03 mg/dL (ref 0.61–1.24)
GFR calc Af Amer: >60 mL/min (ref >60)
GFR calc non Af Amer: >60 mL/min (ref >60)
Glucose, Bld: 94 mg/dL (ref 70–99)
Potassium: 3.9 mmol/L (ref 3.5–5.1)
Sodium: 135 mmol/L (ref 135–145)
Total Bilirubin: 1 mg/dL (ref 0.3–1.2)
Total Protein: 7.2 g/dL (ref 6.5–8.1)

## 2020-07-29 LAB — PROTIME-INR
INR: 1.1 (ref 0.8–1.2)
Prothrombin Time: 13.3 seconds (ref 11.4–15.2)

## 2020-07-29 LAB — ETHANOL: Alcohol, Ethyl (B): <10 mg/dL (ref <10)

## 2020-07-29 LAB — APTT: aPTT: 32 seconds (ref 24–36)

## 2020-07-29 MED ORDER — SODIUM CHLORIDE 0.9 % IV SOLN: INTRAVENOUS | Status: DC

## 2020-07-29 MED ORDER — ACETAMINOPHEN 325 MG PO TABS: 650.0000 mg | ORAL_TABLET | ORAL | Status: DC | PRN

## 2020-07-29 MED ORDER — ADULT MULTIVITAMIN W/MINERALS CH
1.0000 | ORAL_TABLET | Freq: Every day | ORAL | Status: DC
  Administered 2020-07-29: 1 via ORAL
  Filled 2020-07-29 (×3): qty 1

## 2020-07-29 MED ORDER — ASPIRIN EC 325 MG PO TBEC: 325.0000 mg | DELAYED_RELEASE_TABLET | Freq: Once | ORAL | Status: DC

## 2020-07-29 MED ORDER — ASPIRIN 325 MG PO TABS
325.0000 mg | ORAL_TABLET | Freq: Every day | ORAL | Status: DC
  Administered 2020-07-30 – 2020-07-31 (×2): 325 mg via ORAL
  Filled 2020-07-29 (×3): qty 1

## 2020-07-29 MED ORDER — STROKE: EARLY STAGES OF RECOVERY BOOK
Freq: Once | Status: AC
  Administered 2020-07-31: 1
  Filled 2020-07-29: qty 1

## 2020-07-29 MED ORDER — VITAMIN D 25 MCG (1000 UNIT) PO TABS
2000.0000 [IU] | ORAL_TABLET | Freq: Every day | ORAL | Status: DC
  Administered 2020-07-29 – 2020-07-31 (×3): 2000 [IU] via ORAL
  Filled 2020-07-29 (×3): qty 2

## 2020-07-29 MED ORDER — GABAPENTIN 300 MG PO CAPS
600.0000 mg | ORAL_CAPSULE | Freq: Three times a day (TID) | ORAL | Status: DC
  Administered 2020-07-29 – 2020-07-31 (×5): 600 mg via ORAL
  Filled 2020-07-29 (×5): qty 2

## 2020-07-29 MED ORDER — PRAVASTATIN SODIUM 40 MG PO TABS
80.0000 mg | ORAL_TABLET | Freq: Every day | ORAL | Status: DC
  Administered 2020-07-30 – 2020-07-31 (×2): 80 mg via ORAL
  Filled 2020-07-29: qty 2
  Filled 2020-07-29 (×4): qty 1

## 2020-07-29 MED ORDER — ACETAMINOPHEN 160 MG/5ML PO SOLN: 650.0000 mg | ORAL | Status: DC | PRN

## 2020-07-29 MED ORDER — ACETAMINOPHEN 650 MG RE SUPP: 650.0000 mg | RECTAL | Status: DC | PRN

## 2020-07-29 MED ORDER — LORATADINE 10 MG PO TABS
10.0000 mg | ORAL_TABLET | Freq: Every day | ORAL | Status: DC
  Administered 2020-07-29: 10 mg via ORAL
  Filled 2020-07-29 (×3): qty 1

## 2020-07-29 MED ORDER — IOHEXOL 350 MG/ML SOLN
100.0000 mL | Freq: Once | INTRAVENOUS | Status: AC | PRN
  Administered 2020-07-29: 100 mL via INTRAVENOUS

## 2020-07-29 MED ORDER — TAMSULOSIN HCL 0.4 MG PO CAPS
0.4000 mg | ORAL_CAPSULE | Freq: Every day | ORAL | Status: DC
  Administered 2020-07-29 – 2020-07-30 (×2): 0.4 mg via ORAL
  Filled 2020-07-29 (×2): qty 1

## 2020-07-29 MED ORDER — ASPIRIN 81 MG PO CHEW
324.0000 mg | CHEWABLE_TABLET | Freq: Once | ORAL | Status: AC
  Administered 2020-07-29: 324 mg via ORAL
  Filled 2020-07-29: qty 4

## 2020-07-29 MED ORDER — HEPARIN SODIUM (PORCINE) 5000 UNIT/ML IJ SOLN
5000.0000 [IU] | Freq: Three times a day (TID) | INTRAMUSCULAR | Status: DC
  Administered 2020-07-29 – 2020-07-31 (×5): 5000 [IU] via SUBCUTANEOUS
  Filled 2020-07-29 (×5): qty 1

## 2020-07-29 MED ORDER — PANTOPRAZOLE SODIUM 40 MG PO TBEC
40.0000 mg | DELAYED_RELEASE_TABLET | Freq: Every day | ORAL | Status: DC
  Administered 2020-07-29 – 2020-07-31 (×3): 40 mg via ORAL
  Filled 2020-07-29 (×3): qty 1

## 2020-07-29 NOTE — Consult Note (Signed)
   [facility] TeleNeurology Consult Services  Stat Consult  Date of Service:   07/29/2020 14:07:45  Impression:     .  G45.9 - Transient cerebral ischemic attack, unspecified  Comments/Sign-Out: Patient presented with some aphasia intermittently better or worse. Head CT showed no acute process. WOuld recommend ASA for secondary prevention. No acute intervention due to onset of symptoms. Would benefit from carotid studies and TTE, lipid panel, HbA1c  CT HEAD: Showed No Acute Hemorrhage or Acute Core Infarct  Metrics: [facility] Notification Time: 07/29/2020 14:05:07 Stamp Time: 07/29/2020 14:07:45 Callback Response Time: 07/29/2020 14:10:42  Our recommendations are outlined below.  Recommendations:     .  Initiate Aspirin 325 MG Daily   Imaging Studies:     .  Carotid Dopplers     .  Echocardiogram - Transthoracic Echocardiogram  Therapies:     .  Physical Therapy, Occupational Therapy, Speech Therapy Assessment When Applicable  Disposition: Neurology Follow Up Recommended  Sign Out:     .  Discussed with Emergency Department Provider  ----------------------------------------------------------------------------------------------------  Chief Complaint: Difficulty reading.  History of Present Illness: Patient is a 86 year old Male.  presented with generalized weakness. History was provided by the patient. Had fever 103.68F had trouble communicating and generalized weakness. unable to get out of bed. Yesterday had trouble discerning the words when reading form his iPAD. Had trouble reading the word.    Past Medical History:     . Hyperlipidemia    Antiplatelet use: ASA     Examination: BP(153/102), Pulse(60),  1A: Level of Consciousness - Alert; keenly responsive + 0 1B: Ask Month and Age - Both Questions Right + 0 1C: Blink Eyes & Squeeze Hands - Performs Both Tasks + 0 2: Test Horizontal Extraocular Movements - Normal + 0 3: Test Visual Fields -  No Visual Loss + 0 4: Test Facial Palsy (Use Grimace if Obtunded) - Normal symmetry + 0 5A: Test Left Arm Motor Drift - No Drift for 10 Seconds + 0 5B: Test Right Arm Motor Drift - No Drift for 10 Seconds + 0 6A: Test Left Leg Motor Drift - No Drift for 5 Seconds + 0 6B: Test Right Leg Motor Drift - No Drift for 5 Seconds + 0 7: Test Limb Ataxia (FNF/Heel-Shin) - No Ataxia + 0 8: Test Sensation - Normal; No sensory loss + 0 9: Test Language/Aphasia - Normal; No aphasia + 0 10: Test Dysarthria - Normal + 0 11: Test Extinction/Inattention - No abnormality + 0  NIHSS Score: 0   Patient/Family was informed the Neurology Consult would occur via TeleHealth consult by way of interactive audio and video telecommunications and consented to receiving care in this manner.  Patient is being evaluated for possible acute neurologic impairment and high probability of imminent or life-threatening deterioration. I spent total of 25 minutes providing care to this patient, including time for face to face visit via telemedicine, review of medical records, imaging studies and discussion of findings with providers, the patient and/or family.   Dr Hinda Lenis Orlandis Sanden   [facility] [phone]  Case [id number]

## 2020-07-29 NOTE — ED Triage Notes (Signed)
Pt states he has had vision problemsfor the past few days.

## 2020-07-29 NOTE — ED Provider Notes (Signed)
Emergency Department Provider Note   I have reviewed the triage vital signs and the nursing notes.   HISTORY  Chief Complaint Blurred Vision   HPI Kevin Matthews is a 86 y.o. male with past medical history reviewed below presents to the emergency department for evaluation of difficulty with reading.  Patient noticed symptoms over the past 2 days.  Patient states with words he is familiar with that are short and he can identify by site he has no problems but if he has to read through and sound out a word he is unable to recognize the individual letters.  He has been unable to read the newspaper for the past 2 days which he typically does without difficulty.  He is not experiencing any numbness or weakness.  No speech change.  He has no difficulty with numbers.  No particular vision change. He has a spinal stimulator in place and cannot get an MRI. No radiation of symptoms or modifying factors.    Past Medical History:  Diagnosis Date  . Arthritis   . BPH (benign prostatic hyperplasia)   . GERD (gastroesophageal reflux disease)   . Heart murmur    dr Feliz Beam  . Hypercholesteremia     Patient Active Problem List   Diagnosis Date Noted  . Essential hypertension 07/29/2020  . Acute CVA (cerebrovascular accident) ([place]) 07/29/2020  . Toxic encephalopathy 01/15/2014  . CAP (community acquired pneumonia) 01/13/2014  . Altered mental status 01/13/2014  . Lumbar stenosis with neurogenic claudication 01/07/2014  . Lumbago 02/25/2013    Past Surgical History:  Procedure Laterality Date  . BACK SURGERY     x5 cervical plate;  Marland Kitchen CAROTID ENDARTERECTOMY Left   . CATARACT EXTRACTION W/PHACO Right 07/27/2015   Procedure: CATARACT EXTRACTION PHACO AND INTRAOCULAR LENS PLACEMENT (IOC);  Surgeon: Rutherford Guys, MD;  Location: [facility];  Service: Ophthalmology;  Laterality: Right;  CDE:9.43  . CATARACT EXTRACTION W/PHACO Left 08/10/2015   Procedure: CATARACT EXTRACTION PHACO AND  INTRAOCULAR LENS PLACEMENT (IOC);  Surgeon: Rutherford Guys, MD;  Location: [facility];  Service: Ophthalmology;  Laterality: Left;  CDE:8.27  . LUMBAR LAMINECTOMY/DECOMPRESSION MICRODISCECTOMY N/A 01/07/2014   Procedure: Lumbar three-four, Lumbar four-five redo laminectomy and foraminotomy;  Surgeon: Ophelia Charter, MD;  Location: [facility];  Service: Neurosurgery;  Laterality: N/A;  Lumbar three-four, Lumbar four-five redo laminectomy and foraminotomy  . TONSILLECTOMY    . VASCULAR SURGERY      Allergies Oxycodone  History reviewed. No pertinent family history.  Social History Social History   Tobacco Use  . Smoking status: Former Smoker    Packs/day: 2.00    Years: 32.00    Pack years: 64.00    Types: Cigarettes    Quit date: 07/21/1979    Years since quitting: 41.0  . Smokeless tobacco: Never Used  Substance Use Topics  . Alcohol use: Yes    Alcohol/week: 8.0 standard drinks    Types: 8 Cans of beer per week    Comment: weekly  . Drug use: No    Review of Systems  Constitutional: No fever/chills Eyes: No visual changes. ENT: No sore throat. Cardiovascular: Denies chest pain. Respiratory: Denies shortness of breath. Gastrointestinal: No abdominal pain.  No nausea, no vomiting.  No diarrhea.  No constipation. Genitourinary: Negative for dysuria. Musculoskeletal: Negative for back pain. Skin: Negative for rash. Neurological: Negative for headaches, focal weakness or numbness. Positive difficulty with reading.   10-point ROS otherwise negative.  ____________________________________________  PHYSICAL EXAM:  VITAL SIGNS: ED Triage Vitals  Enc Vitals Group     BP 07/29/20 1325 (!) 153/102     Pulse Rate 07/29/20 1325 60     Resp 07/29/20 1325 17     Temp 07/29/20 1325 97.9 F (36.6 C)     Temp Source 07/29/20 1325 Oral     SpO2 07/29/20 1325 94 %     Weight 07/29/20 1326 165 lb (74.8 kg)     Height 07/29/20 1326 5\' 9"  (1.753 m)   Constitutional: Alert and  oriented. Well appearing and in no acute distress. Eyes: Conjunctivae are normal. PERRL. EOMI. Head: Atraumatic. Nose: No congestion/rhinnorhea. Mouth/Throat: Mucous membranes are moist.   Neck: No stridor.   Cardiovascular: Normal rate, regular rhythm. Good peripheral circulation. Grossly normal heart sounds.   Respiratory: Normal respiratory effort.  No retractions. Lungs CTAB. Gastrointestinal: Soft and nontender. No distention.  Musculoskeletal: No lower extremity tenderness nor edema. No gross deformities of extremities. Neurologic:  Normal speech and language. No gross focal neurologic deficits are appreciated. No facial asymmetry. Equal strength and sensation bilaterally.  Skin:  Skin is warm, dry and intact. No rash noted.   ____________________________________________   LABS (all labs ordered are listed, but only abnormal results are displayed)  Labs Reviewed  CBC - Abnormal; Notable for the following components:      Result Value   WBC 12.2 (*)    RBC 3.88 (*)    Hemoglobin 11.1 (*)    HCT 34.9 (*)    All other components within normal limits  DIFFERENTIAL - Abnormal; Notable for the following components:   Monocytes Absolute 1.2 (*)    All other components within normal limits  COMPREHENSIVE METABOLIC PANEL - Abnormal; Notable for the following components:   Chloride 96 (*)    Albumin 3.3 (*)    All other components within normal limits  HEMOGLOBIN A1C - Abnormal; Notable for the following components:   Hgb A1c MFr Bld 5.8 (*)    All other components within normal limits  LIPID PANEL - Abnormal; Notable for the following components:   HDL 30 (*)    All other components within normal limits  RESPIRATORY PANEL BY RT PCR (FLU A&B, COVID)  ETHANOL  PROTIME-INR  APTT  RAPID URINE DRUG SCREEN, HOSP PERFORMED  URINALYSIS, ROUTINE W REFLEX MICROSCOPIC   ____________________________________________  EKG   EKG Interpretation  Date/Time:  Thursday July 29 2020 14:48:34 EDT Ventricular Rate:  57 PR Interval:    QRS Duration: 98 QT Interval:  434 QTC Calculation: 423 R Axis:   -6 Text Interpretation: Sinus rhythm Low voltage, precordial leads Abnormal R-wave progression, early transition Minimal ST depression, anterolateral leads Since last tracing rate faster Otherwise no significant change Confirmed by Daleen Bo [phone]) on 07/29/2020 4:28:04 PM       ____________________________________________  RADIOLOGY  CT ANGIO HEAD W OR WO CONTRAST  Result Date: 07/29/2020 CLINICAL DATA:  Blurry vision. EXAM: CT ANGIOGRAPHY HEAD AND NECK TECHNIQUE: Multidetector CT imaging of the head and neck was performed using the standard protocol during bolus administration of intravenous contrast. Multiplanar CT image reconstructions and MIPs were obtained to evaluate the vascular anatomy. Carotid stenosis measurements (when applicable) are obtained utilizing NASCET criteria, using the distal internal carotid diameter as the denominator. CONTRAST:  174mL OMNIPAQUE IOHEXOL 350 MG/ML SOLN COMPARISON:  None. FINDINGS: CTA NECK FINDINGS SKELETON: There is no bony spinal canal stenosis. No lytic or blastic lesion. OTHER NECK: Normal pharynx, larynx  and major salivary glands. No cervical lymphadenopathy. Unremarkable thyroid gland. UPPER CHEST: No pneumothorax or pleural effusion. No nodules or masses. AORTIC ARCH: There is mild calcific atherosclerosis of the aortic arch. There is no aneurysm, dissection or hemodynamically significant stenosis of the visualized portion of the aorta. Conventional 3 vessel aortic branching pattern. The visualized proximal subclavian arteries are widely patent. RIGHT CAROTID SYSTEM: Normal without aneurysm, dissection or stenosis. LEFT CAROTID SYSTEM: Normal without aneurysm, dissection or stenosis. VERTEBRAL ARTERIES: Left dominant configuration. Both origins are clearly patent. There is no dissection, occlusion or flow-limiting stenosis to  the skull base (V1-V3 segments). CTA HEAD FINDINGS POSTERIOR CIRCULATION: --Vertebral arteries: Normal V4 segments. --Inferior cerebellar arteries: Normal. --Basilar artery: Normal. --Superior cerebellar arteries: Normal. --Posterior cerebral arteries (PCA): Normal. ANTERIOR CIRCULATION: --Intracranial internal carotid arteries: Atherosclerotic calcification of the internal carotid arteries at the skull base without hemodynamically significant stenosis. --Anterior cerebral arteries (ACA): Normal. Both A1 segments are present. Patent anterior communicating artery (a-comm). --Middle cerebral arteries (MCA): Normal. VENOUS SINUSES: As permitted by contrast timing, patent. ANATOMIC VARIANTS: None Review of the MIP images confirms the above findings. IMPRESSION: No emergent large vessel occlusion or high-grade stenosis of the intracranial or cervical arteries. Aortic atherosclerosis (ICD10-I70.0). Electronically Signed   By: Ulyses Jarred M.D.   On: 07/29/2020 23:10   DG Chest 2 View  Result Date: 07/29/2020 CLINICAL DATA:  Blurry vision. EXAM: CHEST - 2 VIEW COMPARISON:  January 14, 2014 FINDINGS: Mild areas of atelectasis and/or infiltrate are seen within the mid right lung and right lung base. There is no evidence of a pleural effusion or pneumothorax. The heart size and mediastinal contours are within normal limits. There is mild calcification of the aortic arch. A radiopaque fusion plate and screws are seen overlying the lower cervical spine. Mild to moderate severity dextroscoliosis of the upper thoracic spine is noted. IMPRESSION: Mild mid right lung and right basilar atelectasis and/or infiltrate. Electronically Signed   By: Virgina Norfolk M.D.   On: 07/29/2020 23:06   CT HEAD WO CONTRAST  Result Date: 07/29/2020 CLINICAL DATA:  Possible TIA versus stroke. Weakness. Difficulty discerning words. EXAM: CT HEAD WITHOUT CONTRAST TECHNIQUE: Contiguous axial images were obtained from the base of the skull  through the vertex without intravenous contrast. COMPARISON:  CT head January 13, 2014 FINDINGS: Brain: There is a new area of cytotoxic edema and loss of gray-white differentiation involving the posterior left temporal lobe and lateral occipital lobe, compatible with acute or early subacute infarct. Mild local mass effect with sulcal effacement. No midline shift. Basal cisterns are patent. No acute hemorrhage. Similar focal hypodensity in the inferior left basal ganglia, likely a dilated perivascular space. No hydrocephalus. Vascular: No hyperdense vessel identified. Calcific atherosclerosis. Skull: Normal. Negative for fracture or focal lesion. Sinuses/Orbits: Visualized sinuses are clear. No acute orbital abnormality. Other: No mastoid effusion. IMPRESSION: Acute or early subacute infarct in the posterior left temporal and lateral occipital lobe. Sulcal effacement without midline shift. No evidence of hemorrhagic transformation. Consider MRI for further evaluation. These findings were discussed with Dr. Eulis Foster at 4:20 p.m. via telephone. Electronically Signed   By: Margaretha Sheffield MD   On: 07/29/2020 16:23   CT ANGIO NECK W OR WO CONTRAST  Result Date: 07/29/2020 CLINICAL DATA:  Blurry vision. EXAM: CT ANGIOGRAPHY HEAD AND NECK TECHNIQUE: Multidetector CT imaging of the head and neck was performed using the standard protocol during bolus administration of intravenous contrast. Multiplanar CT image reconstructions and MIPs were obtained to  evaluate the vascular anatomy. Carotid stenosis measurements (when applicable) are obtained utilizing NASCET criteria, using the distal internal carotid diameter as the denominator. CONTRAST:  120mL OMNIPAQUE IOHEXOL 350 MG/ML SOLN COMPARISON:  None. FINDINGS: CTA NECK FINDINGS SKELETON: There is no bony spinal canal stenosis. No lytic or blastic lesion. OTHER NECK: Normal pharynx, larynx and major salivary glands. No cervical lymphadenopathy. Unremarkable thyroid gland.  UPPER CHEST: No pneumothorax or pleural effusion. No nodules or masses. AORTIC ARCH: There is mild calcific atherosclerosis of the aortic arch. There is no aneurysm, dissection or hemodynamically significant stenosis of the visualized portion of the aorta. Conventional 3 vessel aortic branching pattern. The visualized proximal subclavian arteries are widely patent. RIGHT CAROTID SYSTEM: Normal without aneurysm, dissection or stenosis. LEFT CAROTID SYSTEM: Normal without aneurysm, dissection or stenosis. VERTEBRAL ARTERIES: Left dominant configuration. Both origins are clearly patent. There is no dissection, occlusion or flow-limiting stenosis to the skull base (V1-V3 segments). CTA HEAD FINDINGS POSTERIOR CIRCULATION: --Vertebral arteries: Normal V4 segments. --Inferior cerebellar arteries: Normal. --Basilar artery: Normal. --Superior cerebellar arteries: Normal. --Posterior cerebral arteries (PCA): Normal. ANTERIOR CIRCULATION: --Intracranial internal carotid arteries: Atherosclerotic calcification of the internal carotid arteries at the skull base without hemodynamically significant stenosis. --Anterior cerebral arteries (ACA): Normal. Both A1 segments are present. Patent anterior communicating artery (a-comm). --Middle cerebral arteries (MCA): Normal. VENOUS SINUSES: As permitted by contrast timing, patent. ANATOMIC VARIANTS: None Review of the MIP images confirms the above findings. IMPRESSION: No emergent large vessel occlusion or high-grade stenosis of the intracranial or cervical arteries. Aortic atherosclerosis (ICD10-I70.0). Electronically Signed   By: Ulyses Jarred M.D.   On: 07/29/2020 23:10    ____________________________________________   PROCEDURES  Procedure(s) performed:   Procedures  None  ____________________________________________   INITIAL IMPRESSION / ASSESSMENT AND PLAN / ED COURSE  Pertinent labs & imaging results that were available during my care of the patient were  reviewed by me and considered in my medical decision making (see chart for details).   Patient presents to the emergency department for evaluation of difficulty with reading over the past 2 days.  My evaluation he can read simple words which he can recognize by site but a more complicated words he is unable to sound the mouth.  He has no other focal neuro deficits.  Patient cannot receive MRI because he has a spinal stimulator.  Plan for Noncon CT imaging and labs along with teleneurology evaluation.   CT head reviewed. Labs pending. Case discussed with Neurology after their evaluation. Patient cannot have MRI but given history will admit for other CVA w/u and observation. Discussed with patient who is in agreement. Care transferred to Dr. Eulis Foster pending labs and admit.  ____________________________________________  FINAL CLINICAL IMPRESSION(S) / ED DIAGNOSES  Final diagnoses:  Cerebrovascular accident (CVA), unspecified mechanism ([place])  Stroke ([place])  Stroke [facility])     MEDICATIONS GIVEN DURING THIS VISIT:  Medications  aspirin tablet 325 mg (325 mg Oral Given 07/30/20 0930)  pravastatin (PRAVACHOL) tablet 80 mg (80 mg Oral Given 07/30/20 0931)  pantoprazole (PROTONIX) EC tablet 40 mg (40 mg Oral Given 07/30/20 0930)  tamsulosin (FLOMAX) capsule 0.4 mg (0.4 mg Oral Given 07/29/20 1947)  gabapentin (NEURONTIN) capsule 600 mg (600 mg Oral Given 07/30/20 0931)  cholecalciferol (VITAMIN D3) tablet 2,000 Units (2,000 Units Oral Given 07/30/20 0930)  multivitamin with minerals tablet 1 tablet (1 tablet Oral Not Given 07/30/20 0930)  loratadine (CLARITIN) tablet 10 mg (10 mg Oral Not Given 07/30/20 0931)   stroke: mapping  our early stages of recovery book ( Does not apply Not Given 07/29/20 1949)  0.9 %  sodium chloride infusion ( Intravenous Rate/Dose Verify 07/30/20 0509)  acetaminophen (TYLENOL) tablet 650 mg (has no administration in time range)    Or  acetaminophen (TYLENOL) 160 MG/5ML solution 650  mg (has no administration in time range)    Or  acetaminophen (TYLENOL) suppository 650 mg (has no administration in time range)  heparin injection 5,000 Units (5,000 Units Subcutaneous Given 07/30/20 0542)  aspirin chewable tablet 324 mg (324 mg Oral Given 07/29/20 1945)  iohexol (OMNIPAQUE) 350 MG/ML injection 100 mL (100 mLs Intravenous Contrast Given 07/29/20 2240)    Note:  This document was prepared using Dragon voice recognition software and may include unintentional dictation errors.  Nanda Quinton, MD, [facility] Emergency Medicine    Chadric Kimberley, [facility], MD 07/30/20 [phone]

## 2020-07-29 NOTE — H&P (Signed)
TRH H&P   Patient Demographics:    Kevin Matthews, is a 86 y.o. male  MRN: [id number]   DOB - [birth year]  Admit Date - 07/29/2020  Outpatient Primary MD for the patient is Amelia  Referring MD/NP/PA: Dr Eulis Foster  Patient coming from: home  Chief Complaint  Patient presents with  . Blurred Vision      HPI:    Kevin Matthews  is a 86 y.o. male, with past medical history of hypertension(orthostasis as well), hyperlipidemia, GERD, BPH, presents to ED secondary to complaints of intermittent blurry vision, and difficulty with reading, patient reports symptom for last 48 hours, ports words he is familiar with, he can identify, but reports he is unable to recognize individual letters, which caused him significant problem of reading his newspaper for last 2 days, otherwise he denies any other focal deficits, denies focal weakness, any tingling or numbness, no slurred speech, no altered mental status, he denies any particular visual changes. - in ED patient with brain stimulator, so MRI could not be done, CT head was significant for acute/subacute left posterior temporal/occipital lobe infarcts, blood work significant for mild leukocytosis at 12.2, he denies any fever or chills, patient received full dose aspirin in ED, [facility] hospitalist consulted to admit.    Review of systems:    In addition to the HPI above, No Fever-chills, No Headache, reports some blurry vision, and does report problems reading letters and words No problems swallowing food or Liquids, No Chest pain, Cough or Shortness of Breath, No Abdominal pain, No Nausea or Vommitting, Bowel movements are regular, No Blood in stool or Urine, No dysuria, No new skin rashes or bruises, No new joints pains-aches,  No new weakness, tingling, numbness in any extremity, No recent weight gain or loss, No  polyuria, polydypsia or polyphagia, No significant Mental Stressors.  A full 10 point Review of Systems was done, except as stated above, all other Review of Systems were negative.   With Past History of the following :    Past Medical History:  Diagnosis Date  . Arthritis   . BPH (benign prostatic hyperplasia)   . GERD (gastroesophageal reflux disease)   . Heart murmur    dr Feliz Beam  . Hypercholesteremia       Past Surgical History:  Procedure Laterality Date  . BACK SURGERY     x5 cervical plate;  Marland Kitchen CAROTID ENDARTERECTOMY Left   . CATARACT EXTRACTION W/PHACO Right 07/27/2015   Procedure: CATARACT EXTRACTION PHACO AND INTRAOCULAR LENS PLACEMENT (IOC);  Surgeon: Rutherford Guys, MD;  Location: [facility];  Service: Ophthalmology;  Laterality: Right;  CDE:9.43  . CATARACT EXTRACTION W/PHACO Left 08/10/2015   Procedure: CATARACT EXTRACTION PHACO AND INTRAOCULAR LENS PLACEMENT (IOC);  Surgeon: Rutherford Guys, MD;  Location: [facility];  Service: Ophthalmology;  Laterality: Left;  CDE:8.27  . LUMBAR LAMINECTOMY/DECOMPRESSION  MICRODISCECTOMY N/A 01/07/2014   Procedure: Lumbar three-four, Lumbar four-five redo laminectomy and foraminotomy;  Surgeon: Ophelia Charter, MD;  Location: [facility];  Service: Neurosurgery;  Laterality: N/A;  Lumbar three-four, Lumbar four-five redo laminectomy and foraminotomy  . TONSILLECTOMY    . VASCULAR SURGERY        Social History:     Social History   Tobacco Use  . Smoking status: Former Smoker    Packs/day: 2.00    Years: 32.00    Pack years: 64.00    Types: Cigarettes    Quit date: 07/21/1979    Years since quitting: 41.0  . Smokeless tobacco: Never Used  Substance Use Topics  . Alcohol use: Yes    Alcohol/week: 8.0 standard drinks    Types: 8 Cans of beer per week    Comment: weekly        Family History :    History reviewed. No pertinent family history.    Home Medications:   Prior to Admission medications   Medication  Sig Start Date End Date Taking? Authorizing Provider  aspirin 325 MG tablet Take 325 mg by mouth daily.    [provider]  Cholecalciferol (VITAMIN D3) 2000 UNITS TABS Take 2,000 Units by mouth daily.    [provider]  gabapentin (NEURONTIN) 300 MG capsule Take 600 mg by mouth 3 (three) times daily.    [provider]  ibuprofen (ADVIL,MOTRIN) 200 MG tablet Take 600 mg by mouth daily as needed for moderate pain.     [provider]  loratadine (CLARITIN) 10 MG tablet Take 10 mg by mouth daily.    [provider]  Multiple Vitamin (MULTIVITAMIN) capsule Take 1 capsule by mouth daily.    [provider]  ofloxacin (OCUFLOX) 0.3 % ophthalmic solution Place 1 drop into the right eye 2 (two) times daily. 07/06/15   [provider]  omeprazole (PRILOSEC) 20 MG capsule Take 20 mg by mouth 2 (two) times daily before a meal.     [provider]  pravastatin (PRAVACHOL) 40 MG tablet Take 80 mg by mouth daily.    [provider]  predniSONE (DELTASONE) 10 MG tablet One tablet a day 02/16/20   Caryl Ada K, PA-C  PROLENSA 0.07 % SOLN Place 1 drop into the right eye 3 (three) times daily. 07/06/15   [provider]  tamsulosin (FLOMAX) 0.4 MG CAPS capsule Take 1 capsule (0.4 mg total) by mouth daily after supper. 01/15/14   Nita Sells, MD     Allergies:     Allergies  Allergen Reactions  . Oxycodone Other (See Comments)    States that when he took oxycodone he couldn't move out of the bed.     Physical Exam:   Vitals  Blood pressure (!) 168/78, pulse (!) 55, temperature 97.9 F (36.6 C), temperature source Oral, resp. rate 15, height 5\' 9"  (1.753 m), weight 74.8 kg, SpO2 95 %.   1. General developed male, laying in bed, in no apparent distress  2. Normal affect and insight, Not Suicidal or Homicidal, Awake Alert, Oriented X 3.  3. Strength 5/5 all 4 extremities, Sensation intact all 4  extremities, Plantars down going.  4. Ears and Eyes appear Normal, Conjunctivae clear, PERRLA. Moist Oral Mucosa.  5. Supple Neck, No JVD, No cervical lymphadenopathy appriciated, No Carotid Bruits.  6. Symmetrical Chest wall movement, Good air movement bilaterally, CTAB.  7. RRR, No Gallops, Rubs or Murmurs, No Parasternal Heave.  8. Positive Bowel Sounds, Abdomen Soft, No tenderness, No organomegaly appriciated,No rebound -guarding or rigidity.  9.  No Cyanosis, Normal Skin Turgor, No Skin Rash or Bruise.  10. Good muscle tone,  joints appear normal , no effusions, Normal ROM.  11. No Palpable Lymph Nodes in Neck or Axillae    Data Review:    CBC Recent Labs  Lab 07/29/20 1506  WBC 12.2*  HGB 11.1*  HCT 34.9*  PLT 243  MCV 89.9  MCH 28.6  MCHC 31.8  RDW 14.7  LYMPHSABS 2.9  MONOABS 1.2*  EOSABS 0.4  BASOSABS 0.1   ------------------------------------------------------------------------------------------------------------------  Chemistries  Recent Labs  Lab 07/29/20 1506  NA 135  K 3.9  CL 96*  CO2 27  GLUCOSE 94  BUN 16  CREATININE 1.03  CALCIUM 9.0  AST 36  ALT 15  ALKPHOS 69  BILITOT 1.0   ------------------------------------------------------------------------------------------------------------------ estimated creatinine clearance is 53.4 mL/min (by C-G formula based on SCr of 1.03 mg/dL). ------------------------------------------------------------------------------------------------------------------ No results for input(s): TSH, T4TOTAL, T3FREE, THYROIDAB in the last 72 hours.  Invalid input(s): FREET3  Coagulation profile Recent Labs  Lab 07/29/20 1506  INR 1.1   ------------------------------------------------------------------------------------------------------------------- No results for input(s): DDIMER in the last 72  hours. -------------------------------------------------------------------------------------------------------------------  Cardiac Enzymes No results for input(s): CKMB, TROPONINI, MYOGLOBIN in the last 168 hours.  Invalid input(s): CK ------------------------------------------------------------------------------------------------------------------ No results found for: BNP   ---------------------------------------------------------------------------------------------------------------  Urinalysis    Component Value Date/Time   COLORURINE YELLOW 07/29/2020 1400   APPEARANCEUR CLEAR 07/29/2020 1400   LABSPEC 1.010 07/29/2020 1400   PHURINE 6.0 07/29/2020 1400   GLUCOSEU NEGATIVE 07/29/2020 1400   HGBUR NEGATIVE 07/29/2020 1400   BILIRUBINUR NEGATIVE 07/29/2020 1400   KETONESUR NEGATIVE 07/29/2020 1400   PROTEINUR NEGATIVE 07/29/2020 1400   UROBILINOGEN 0.2 01/22/2014 0930   NITRITE NEGATIVE 07/29/2020 1400   LEUKOCYTESUR NEGATIVE 07/29/2020 1400    ----------------------------------------------------------------------------------------------------------------   Imaging Results:    CT HEAD WO CONTRAST  Result Date: 07/29/2020 CLINICAL DATA:  Possible TIA versus stroke. Weakness. Difficulty discerning words. EXAM: CT HEAD WITHOUT CONTRAST TECHNIQUE: Contiguous axial images were obtained from the base of the skull through the vertex without intravenous contrast. COMPARISON:  CT head January 13, 2014 FINDINGS: Brain: There is a new area of cytotoxic edema and loss of gray-white differentiation involving the posterior left temporal lobe and lateral occipital lobe, compatible with acute or early subacute infarct. Mild local mass effect with sulcal effacement. No midline shift. Basal cisterns are patent. No acute hemorrhage. Similar focal hypodensity in the inferior left basal ganglia, likely a dilated perivascular space. No hydrocephalus. Vascular: No hyperdense vessel identified.  Calcific atherosclerosis. Skull: Normal. Negative for fracture or focal lesion. Sinuses/Orbits: Visualized sinuses are clear. No acute orbital abnormality. Other: No mastoid effusion. IMPRESSION: Acute or early subacute infarct in the posterior left temporal and lateral occipital lobe. Sulcal effacement without midline shift. No evidence of hemorrhagic transformation. Consider MRI for further evaluation. These findings were discussed with Dr. Eulis Foster at 4:20 p.m. via telephone. Electronically Signed   By: Margaretha Sheffield MD   On: 07/29/2020 16:23    My personal review of EKG: Rhythm NSR, Rate  57 /min, QTc 423 , no Acute ST changes   Assessment & Plan:    Active Problems:   Essential hypertension   Acute CVA (cerebrovascular accident) ([place])  Acute CVA -Patient presents with receptive aphasia, otherwise denies any complaints, his CT head significant for left side acute/subacute posterior temporal and lateral occipital lobe  infarct. -Can do MRI given he has spinal stimulator. -We will obtain CTA head and neck -Continue with full dose aspirin, will be given stat dose of 325 mg of chewable aspirin. -He is admitted under CVA pathway, will check lipid panel, hemoglobin A1c, will monitor on telemetry, will obtain 2D echo, consult PT/OT/SLP. -Neurology felt requested in epic. -Low for permissive hypertension.  Hypertension -Patient on low-dose hydralazine 10 mg oral at bedtime, wife/patient report history of orthostasis, will request a TED hose as well, will allow for permissive hypertension.Marland Kitchen  BPH -Continue with home medication  Chronic back pain -Continue with gabapentin, and has a spinal stimulator.   Hyperlipidemia -Check lipid panel, continue with pravastatin  GERD -Continue with PPI  DVT Prophylaxis Heparin   AM Labs Ordered, also please review Full Orders  Family Communication: Admission, patients condition and plan of care including tests being ordered have been discussed with  the patient and wife who indicate understanding and agree with the plan and Code Status.  Code Status Full  Likely DC to  home  Condition GUARDED    Consults called: Neurology requested in epic  Admission status: Inpatient  Time spent in minutes : 60 minutes   Phillips Climes M.D on 07/29/2020 at 5:04 PM   [facility] - Office  [phone]

## 2020-07-29 NOTE — ED Provider Notes (Signed)
3:45 PM-case discussed with Dr. Laverta Baltimore regarding patient's need for hospitalization for acute strokelike syndrome.  Primarily receptive aphasia, isolated to written word.  MRI pending  Clinical Course as of Jul 30 1631  Thu Jul 29, 2020  1623 Received call from radiologist, who has reviewed his CT scan, which is abnormal showing acute stroke, lefttemporal region.   [EW]    Clinical Course User Index [EW] Daleen Bo, MD    Patient has been seen by neurology who recommends hospitalization for stroke evaluation.  MRI abnormal, consistent with acute stroke.  Patient is hemodynamically stable.  Screening work-up is nearly complete and findings are reassuring.   EKG Interpretation  Date/Time:  Thursday July 29 2020 14:48:34 EDT Ventricular Rate:  57 PR Interval:    QRS Duration: 98 QT Interval:  434 QTC Calculation: 423 R Axis:   -6 Text Interpretation: Sinus rhythm Low voltage, precordial leads Abnormal R-wave progression, early transition Minimal ST depression, anterolateral leads Since last tracing rate faster Otherwise no significant change Confirmed by Daleen Bo [phone]) on 07/29/2020 4:28:04 PM        4:27 PM-Consult complete with hospitalist. Patient case explained and discussed.  He agrees to admit patient for further evaluation and treatment. Call ended at 4:42 PM   Daleen Bo, MD 07/29/20 1642

## 2020-07-30 ENCOUNTER — Inpatient Hospital Stay (HOSPITAL_COMMUNITY): Payer: No Typology Code available for payment source

## 2020-07-30 DIAGNOSIS — I340 Nonrheumatic mitral (valve) insufficiency: Secondary | ICD-10-CM

## 2020-07-30 DIAGNOSIS — N400 Enlarged prostate without lower urinary tract symptoms: Secondary | ICD-10-CM

## 2020-07-30 DIAGNOSIS — I10 Essential (primary) hypertension: Secondary | ICD-10-CM

## 2020-07-30 DIAGNOSIS — I639 Cerebral infarction, unspecified: Principal | ICD-10-CM

## 2020-07-30 DIAGNOSIS — I6389 Other cerebral infarction: Secondary | ICD-10-CM

## 2020-07-30 LAB — LIPID PANEL
Cholesterol: 127 mg/dL (ref 0–200)
HDL: 30 mg/dL — ABNORMAL LOW (ref >40)
LDL Cholesterol: 76 mg/dL (ref 0–99)
Total CHOL/HDL Ratio: 4.2 RATIO
Triglycerides: 103 mg/dL (ref <150)
VLDL: 21 mg/dL (ref 0–40)

## 2020-07-30 LAB — ECHOCARDIOGRAM COMPLETE
Area-P 1/2: 1.78 cm2
Height: 69 in
S' Lateral: 3.58 cm
Weight: 2640 oz

## 2020-07-30 LAB — HEMOGLOBIN A1C
Hgb A1c MFr Bld: 5.8 % — ABNORMAL HIGH (ref 4.8–5.6)
Mean Plasma Glucose: 119.76 mg/dL

## 2020-07-30 MED ORDER — CLOPIDOGREL BISULFATE 75 MG PO TABS
75.0000 mg | ORAL_TABLET | Freq: Every day | ORAL | Status: DC
  Administered 2020-07-31: 75 mg via ORAL
  Filled 2020-07-30: qty 1

## 2020-07-30 MED ORDER — SODIUM CHLORIDE 0.9 % IV BOLUS
500.0000 mL | Freq: Once | INTRAVENOUS | Status: AC
  Administered 2020-07-30: 500 mL via INTRAVENOUS

## 2020-07-30 MED ORDER — IOHEXOL 300 MG/ML  SOLN
100.0000 mL | Freq: Once | INTRAMUSCULAR | Status: AC | PRN
  Administered 2020-07-30: 100 mL via INTRAVENOUS

## 2020-07-30 NOTE — Evaluation (Signed)
Physical Therapy Evaluation Patient Details Name: Kevin Matthews MRN: [id number] DOB: 01/24/1934 Today's Date: 07/30/2020   History of Present Illness  Kevin Matthews  is a 86 y.o. male, with past medical history of hypertension(orthostasis as well), hyperlipidemia, GERD, BPH, presents to ED secondary to complaints of intermittent blurry vision, and difficulty with reading, patient reports symptom for last 48 hours, ports words he is familiar with, he can identify, but reports he is unable to recognize individual letters, which caused him significant problem of reading his newspaper for last 2 days, otherwise he denies any other focal deficits, denies focal weakness, any tingling or numbness, no slurred speech, no altered mental status, he denies any particular visual changes. Pt is unable to have MRI.     Clinical Impression  Patient functioning near baseline for functional mobility and gait, requires Min assist for sitting up at bedside with labored movement, able to transfer without AD, but safer using RW, and tolerated ambulating in hallway without loss of balance.  Patient tolerated sitting up in chair after therapy with his spouse present in room - RN aware.  Patient will benefit from continued physical therapy in hospital and recommended venue below to increase strength, balance, endurance for safe ADLs and gait.      Follow Up Recommendations Home health PT;Supervision - Intermittent;Supervision for mobility/OOB    Equipment Recommendations  None recommended by PT    Recommendations for Other Services       Precautions / Restrictions Precautions Precautions: Fall Restrictions Weight Bearing Restrictions: No      Mobility  Bed Mobility Overal bed mobility: Needs Assistance Bed Mobility: Rolling;Sidelying to Sit Rolling: Min guard;Min assist Sidelying to sit: Min assist       General bed mobility comments: increased time, labored movement due to c/o discomfort in right  shoulder  Transfers Overall transfer level: Needs assistance Equipment used: None;Rolling walker (2 wheeled) Transfers: Sit to/from [facility] Sit to Stand: Supervision;Min guard Stand pivot transfers: Supervision;Min guard       General transfer comment: increased time, labored movement, has to lean on nearby objects for support when not using AD  Ambulation/Gait Ambulation/Gait assistance: Min guard Gait Distance (Feet): 65 Feet Assistive device: Rolling walker (2 wheeled) Gait Pattern/deviations: Decreased step length - right;Decreased step length - left;Decreased stride length;Decreased dorsiflexion - right;Steppage Gait velocity: decreased   General Gait Details: slightly labored cadence with hiking of RLE to clear toes (baseline per patient), no loss of balance and limited due to c/o fatigue  Stairs            Wheelchair Mobility    Modified Rankin (Stroke Patients Only)       Balance Overall balance assessment: Needs assistance Sitting-balance support: No upper extremity supported;Feet supported Sitting balance-Leahy Scale: Good Sitting balance - Comments: seated at EOB   Standing balance support: No upper extremity supported;During functional activity Standing balance-Leahy Scale: Poor Standing balance comment: fair/poor without AD, fair using RW                             Pertinent Vitals/Pain Pain Assessment: Faces Faces Pain Scale: Hurts little more Pain Location: back Pain Descriptors / Indicators: Aching;Grimacing;Sore Pain Intervention(s): Limited activity within patient's tolerance;Monitored during session    [place] expects to be discharged to:: Private residence Living Arrangements: Spouse/significant other Available Help at Discharge: Family;Available 24 hours/day Type of Home: House Home Access: Stairs to enter Entrance Stairs-Rails: None  Entrance Stairs-Number of Steps: 1 Home Layout:  One level Home Equipment: Walker - 2 wheels;Walker - 4 wheels;Cane - single point;Shower seat - built in;Grab bars - tub/shower      Prior Function Level of Independence: Needs assistance   Gait / Transfers Assistance Needed: Pt uses SPC and rollator at times for household and very short community distances  ADL's / Homemaking Assistance Needed: wife is available to assist with ADLs as needed. Pt is independent with basic ADLs        Hand Dominance   Dominant Hand: Left    Extremity/Trunk Assessment   Upper Extremity Assessment Upper Extremity Assessment: Overall WFL for tasks assessed    Lower Extremity Assessment Lower Extremity Assessment: Generalized weakness    Cervical / Trunk Assessment Cervical / Trunk Assessment: Normal  Communication   Communication: No difficulties  Cognition Arousal/Alertness: Awake/alert Behavior During Therapy: WFL for tasks assessed/performed Overall Cognitive Status: Within Functional Limits for tasks assessed                                        General Comments      Exercises     Assessment/Plan    PT Assessment Patient needs continued PT services  PT Problem List Decreased strength;Decreased activity tolerance;Decreased balance;Decreased mobility       PT Treatment Interventions DME instruction;Gait training;Stair training;Functional mobility training;Therapeutic activities;Therapeutic exercise;Balance training;Patient/family education    PT Goals (Current goals can be found in the Care Plan section)  Acute Rehab PT Goals Patient Stated Goal: return home with family to assist PT Goal Formulation: With patient/family Time For Goal Achievement: 08/02/20 Potential to Achieve Goals: Good    Frequency Min 3X/week   Barriers to discharge        Co-evaluation PT/OT/SLP Co-Evaluation/Treatment: Yes Reason for Co-Treatment: Complexity of the patient's impairments (multi-system involvement) PT goals  addressed during session: Mobility/safety with mobility;Balance;Proper use of DME OT goals addressed during session: ADL's and self-care;Proper use of Adaptive equipment and DME       AM-PAC PT "6 Clicks" Mobility  Outcome Measure Help needed turning from your back to your side while in a flat bed without using bedrails?: A Little Help needed moving from lying on your back to sitting on the side of a flat bed without using bedrails?: A Little Help needed moving to and from a bed to a chair (including a wheelchair)?: A Little Help needed standing up from a chair using your arms (e.g., wheelchair or bedside chair)?: A Little Help needed to walk in hospital room?: A Little Help needed climbing 3-5 steps with a railing? : A Lot 6 Click Score: 17    End of Session   Activity Tolerance: Patient tolerated treatment well;Patient limited by fatigue Patient left: in chair;with call bell/phone within reach;with family/visitor present Nurse Communication: Mobility status PT Visit Diagnosis: Unsteadiness on feet (R26.81);Other abnormalities of gait and mobility (R26.89);Muscle weakness (generalized) (M62.81)    Time: 1610-9604 PT Time Calculation (min) (ACUTE ONLY): 35 min   Charges:   PT Evaluation $PT Eval Moderate Complexity: 1 Mod PT Treatments $Therapeutic Activity: 23-37 mins        10:40 AM, 07/30/20 Kevin Matthews, MPT Physical Therapist with [facility] [phone] office [phone] mobile phone

## 2020-07-30 NOTE — Consult Note (Signed)
[place] A. Merlene Laughter, MD     [email or web address]          Kevin Matthews Kevin an 86 y.o. male.   ASSESSMENT/PLAN: 1. ACUTE VISUAL IMPAIRMENT DUE TO ACUTE ISCHEMIC WATERSHED INFARCT ON Kevin CONTRALATERAL SIDE: RISK FACTORS AGE,  HYPERTENSION, DYSLIPIDEMIA AND Kevin PRIOR HISTORY OF CAROTID DISEASE. Dual antiplatelet agents are recommended.  This Kevin recommended for 4 weeks and then switching to Plavix Kevin suggested. Blood pressure control and statin Kevin also recommended.  This Kevin a cryptogenic stroke and therefore a 30 day event monitor Kevin also suggested. 2.  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  Also suggest outpatient nerve conduction study and EMG.   Kevin Matthews 86 year old right-handed white male who presents with Kevin acute onset of visual impairment. Kevin Matthews does not report having loss of vision or diplopia. He reveals that he could see objects but had difficulty recognizing them suggesting visual agnosia. He denies any dysarthria dysphagia. He denies focal weakness or numbness although on examination this does not seem to be Kevin case. Kevin Matthews had been on aspirin 81 mg prior to this. He reports that his symptoms have improved since being hospitalized. Kevin review systems otherwise negative.     GENERAL: This Kevin a pleasant have his rate male who Kevin doing well at this time.  HEENT:  Neck Kevin supple no trauma noted.  ABDOMEN: soft  EXTREMITIES: No edema   BACK: Normal  SKIN: Normal by inspection.    MENTAL STATUS: Alert and oriented -  Including orientation to his age although he got Kevin month slightly off reporting that it Kevin September 30th. Speech and cognition are generally intact. Judgment and insight normal.  Kevin Matthews named all objects at bedside well. Comprehension and fluency are normal.  CRANIAL NERVES: Pupils are equal, round and reactive to light and accomodation; extra ocular movements  are full, there Kevin no significant nystagmus; visual fields are full; upper and lower facial muscles are normal in strength and symmetric, there Kevin no flattening of Kevin nasolabial folds; tongue Kevin midline; uvula Kevin midline; shoulder elevation Kevin normal.   MOTOR:  Upper extremity shows normal tone, bulk and strength without drift. Right lower extremity shows no drift with normal tone, bulk and strength. Left lower extremity shows significant drift (2) and strength at graded as 4/5 proximally but distal strength 5.  COORDINATION: Left finger to nose Kevin normal, right finger to nose Kevin normal, No rest tremor; no intention tremor; no postural tremor; no bradykinesia.  REFLEXES: Deep tendon reflexes are symmetrical and normal.   SENSATION: Normal to light touch, temperature, and pain.  No extinction on double simultaneous stimulation.   NIH stroke scale 2.   Blood pressure (!) 194/81, pulse 70, temperature 97.9 F (36.6 C), temperature source Oral, resp. rate 20, height 5\' 9"  (1.753 m), weight 74.8 kg, SpO2 96 %.  Past Medical History:  Diagnosis Date  . Arthritis   . BPH (benign prostatic hyperplasia)   . GERD (gastroesophageal reflux disease)   . Heart murmur    dr Feliz Beam  . Hypercholesteremia     Past Surgical History:  Procedure Laterality Date  . BACK SURGERY     x5 cervical plate;  Marland Kitchen CAROTID ENDARTERECTOMY Left   . CATARACT EXTRACTION W/PHACO Right 07/27/2015   Procedure: CATARACT EXTRACTION PHACO AND INTRAOCULAR LENS PLACEMENT (IOC);  Surgeon: Rutherford Guys, MD;  Location: [facility];  Service: Ophthalmology;  Laterality: Right;  CDE:9.43  . CATARACT EXTRACTION W/PHACO Left 08/10/2015   Procedure: CATARACT EXTRACTION PHACO AND INTRAOCULAR LENS PLACEMENT (IOC);  Surgeon: Rutherford Guys, MD;  Location: [facility];  Service: Ophthalmology;  Laterality: Left;  CDE:8.27  . LUMBAR LAMINECTOMY/DECOMPRESSION MICRODISCECTOMY N/A 01/07/2014   Procedure: Lumbar three-four, Lumbar four-five redo  laminectomy and foraminotomy;  Surgeon: Ophelia Charter, MD;  Location: [facility];  Service: Neurosurgery;  Laterality: N/A;  Lumbar three-four, Lumbar four-five redo laminectomy and foraminotomy  . TONSILLECTOMY    . VASCULAR SURGERY      History reviewed. No pertinent family history.  Social History:  reports that he quit smoking about 41 years ago. His smoking use included cigarettes. He has a 64.00 pack-year smoking history. He has never used smokeless tobacco. He reports current alcohol use of about 8.0 standard drinks of alcohol per week. He reports that he does not use drugs.  Allergies:  Allergies  Allergen Reactions  . Oxycodone Other (See Comments)    States that when he took oxycodone he couldn't move out of Kevin bed.    Medications: Prior to Admission medications   Medication Sig Start Date End Date Taking? Authorizing Provider  aspirin 325 MG tablet Take 325 mg by mouth daily.    [provider]  Cholecalciferol (VITAMIN D3) 2000 UNITS TABS Take 2,000 Units by mouth daily.    [provider]  gabapentin (NEURONTIN) 300 MG capsule Take 600 mg by mouth 3 (three) times daily.    [provider]  ibuprofen (ADVIL,MOTRIN) 200 MG tablet Take 600 mg by mouth daily as needed for moderate pain.     [provider]  loratadine (CLARITIN) 10 MG tablet Take 10 mg by mouth daily.    [provider]  Multiple Vitamin (MULTIVITAMIN) capsule Take 1 capsule by mouth daily.    [provider]  ofloxacin (OCUFLOX) 0.3 % ophthalmic solution Place 1 drop into Kevin right eye 2 (two) times daily. 07/06/15   [provider]  omeprazole (PRILOSEC) 20 MG capsule Take 20 mg by mouth 2 (two) times daily before a meal.     [provider]  pravastatin (PRAVACHOL) 40 MG tablet Take 80 mg by mouth daily.    [provider]  predniSONE (DELTASONE) 10 MG tablet One tablet a day 02/16/20   Caryl Ada K, PA-C  PROLENSA 0.07 %  SOLN Place 1 drop into Kevin right eye 3 (three) times daily. 07/06/15   [provider]  tamsulosin (FLOMAX) 0.4 MG CAPS capsule Take 1 capsule (0.4 mg total) by mouth daily after supper. 01/15/14   Nita Sells, MD    Scheduled Meds: .  stroke: mapping our early stages of recovery book   Does not apply Once  . aspirin  325 mg Oral Daily  . cholecalciferol  2,000 Units Oral Daily  . gabapentin  600 mg Oral TID  . heparin  5,000 Units Subcutaneous Q8H  . loratadine  10 mg Oral Daily  . multivitamin with minerals  1 tablet Oral Daily  . pantoprazole  40 mg Oral Daily  . pravastatin  80 mg Oral Daily  . tamsulosin  0.4 mg Oral QPC supper   Continuous Infusions: . sodium chloride 50 mL/hr at 07/30/20 0509   PRN Meds:.acetaminophen **OR** acetaminophen (TYLENOL) oral liquid 160 mg/5 mL **OR** acetaminophen     Results for orders placed or performed during Kevin hospital encounter of 07/29/20 (from Kevin past 48 hour(s))  Urine rapid drug screen (hosp performed)     Status: None   Collection Time: 07/29/20  2:00 PM  Result Value Ref Range   Opiates NONE DETECTED NONE DETECTED   Cocaine NONE DETECTED NONE DETECTED   Benzodiazepines NONE DETECTED NONE DETECTED   Amphetamines NONE DETECTED NONE DETECTED   Tetrahydrocannabinol NONE DETECTED NONE DETECTED   Barbiturates NONE DETECTED NONE DETECTED    Comment: (NOTE) DRUG SCREEN FOR MEDICAL PURPOSES ONLY.  IF CONFIRMATION Kevin NEEDED FOR ANY PURPOSE, NOTIFY LAB WITHIN 5 DAYS.  LOWEST DETECTABLE LIMITS FOR URINE DRUG SCREEN Drug Class                     Cutoff (ng/mL) Amphetamine and metabolites    1000 Barbiturate and metabolites    200 Benzodiazepine                 761 Tricyclics and metabolites     300 Opiates and metabolites        300 Cocaine and metabolites        300 THC                            50 Performed at [facility], [address], [place], [place] [address]   Urinalysis, Routine w reflex microscopic  Urine, Clean Catch     Status: None   Collection Time: 07/29/20  2:00 PM  Result Value Ref Range   Color, Urine YELLOW YELLOW   APPearance CLEAR CLEAR   Specific Gravity, Urine 1.010 1.005 - 1.030   pH 6.0 5.0 - 8.0   Glucose, UA NEGATIVE NEGATIVE mg/dL   Hgb urine dipstick NEGATIVE NEGATIVE   Bilirubin Urine NEGATIVE NEGATIVE   Ketones, ur NEGATIVE NEGATIVE mg/dL   Protein, ur NEGATIVE NEGATIVE mg/dL   Nitrite NEGATIVE NEGATIVE   Leukocytes,Ua NEGATIVE NEGATIVE    Comment: Performed at [facility], [address], [place], [place] [address]  Ethanol     Status: None   Collection Time: 07/29/20  3:06 PM  Result Value Ref Range   Alcohol, Ethyl (B) <10 <10 mg/dL    Comment: (NOTE) Lowest detectable limit for serum alcohol Kevin 10 mg/dL.  For medical purposes only. Performed at [facility], [address], [place], [place] [address]   Protime-INR     Status: None   Collection Time: 07/29/20  3:06 PM  Result Value Ref Range   Prothrombin Time 13.3 11.4 - 15.2 seconds   INR 1.1 0.8 - 1.2    Comment: (NOTE) INR goal varies based on device and disease states. Performed at [facility], [address] [place], [place] [address]   APTT     Status: None   Collection Time: 07/29/20  3:06 PM  Result Value Ref Range   aPTT 32 24 - 36 seconds    Comment: Performed at [facility], [address], [place], [place] [address]  CBC     Status: Abnormal   Collection Time: 07/29/20  3:06 PM  Result Value Ref Range   WBC 12.2 (H) 4.0 - 10.5 K/uL   RBC 3.88 (L) 4.22 - 5.81 MIL/uL   Hemoglobin 11.1 (L) 13.0 - 17.0 g/dL   HCT 34.9 (L) 39 - 52 %   MCV 89.9 80.0 - 100.0 fL   MCH 28.6 26.0 - 34.0 pg   MCHC 31.8 30.0 - 36.0 g/dL   RDW 14.7 11.5 - 15.5 %   Platelets  243 150 - 400 K/uL   nRBC 0.0 0.0 - 0.2 %    Comment: Performed at [facility], [address] [place], [place] [address]  Differential     Status: Abnormal   Collection Time: 07/29/20  3:06 PM  Result Value Ref Range    Neutrophils Relative % 62 %   Neutro Abs 7.6 1.7 - 7.7 K/uL   Lymphocytes Relative 24 %   Lymphs Abs 2.9 0.7 - 4.0 K/uL   Monocytes Relative 10 %   Monocytes Absolute 1.2 (H) 0 - 1 K/uL   Eosinophils Relative 3 %   Eosinophils Absolute 0.4 0 - 0 K/uL   Basophils Relative 0 %   Basophils Absolute 0.1 0 - 0 K/uL   Immature Granulocytes 1 %   Abs Immature Granulocytes 0.06 0.00 - 0.07 K/uL    Comment: Performed at [facility], [address] [place], [place] [address]  Comprehensive metabolic panel     Status: Abnormal   Collection Time: 07/29/20  3:06 PM  Result Value Ref Range   Sodium 135 135 - 145 mmol/L   Potassium 3.9 3.5 - 5.1 mmol/L   Chloride 96 (L) 98 - 111 mmol/L   CO2 27 22 - 32 mmol/L   Glucose, Bld 94 70 - 99 mg/dL    Comment: Glucose reference range applies only to samples taken after fasting for at least 8 hours.   BUN 16 8 - 23 mg/dL   Creatinine, Ser 1.03 0.61 - 1.24 mg/dL   Calcium 9.0 8.9 - 10.3 mg/dL   Total Protein 7.2 6.5 - 8.1 g/dL   Albumin 3.3 (L) 3.5 - 5.0 g/dL   AST 36 15 - 41 U/L   ALT 15 0 - 44 U/L   Alkaline Phosphatase 69 38 - 126 U/L   Total Bilirubin 1.0 0.3 - 1.2 mg/dL   GFR calc non Af Amer >60 >60 mL/min   GFR calc Af Amer >60 >60 mL/min   Anion gap 12 5 - 15    Comment: Performed at [facility], [address], [place], [place] [address]  Respiratory Panel by RT PCR (Flu A&B, Covid) - Nasopharyngeal Swab     Status: None   Collection Time: 07/29/20  4:26 PM   Specimen: Nasopharyngeal Swab  Result Value Ref Range   SARS Coronavirus 2 by RT PCR NEGATIVE NEGATIVE    Comment: (NOTE) SARS-CoV-2 target nucleic acids are NOT DETECTED.  Kevin SARS-CoV-2 RNA Kevin generally detectable in upper respiratoy specimens during Kevin acute phase of infection. Kevin lowest concentration of SARS-CoV-2 viral copies this assay can detect Kevin 131 copies/mL. A negative result does not preclude SARS-Cov-2 infection and should not be used as Kevin sole basis for  treatment or other Matthews management decisions. A negative result may occur with  improper specimen collection/handling, submission of specimen other than nasopharyngeal swab, presence of viral mutation(s) within Kevin areas targeted by this assay, and inadequate number of viral copies (<131 copies/mL). A negative result must be combined with clinical observations, Matthews history, and epidemiological information. Kevin expected result Kevin Negative.  Fact Sheet for Patients:  PinkCheek.be  Fact Sheet for Healthcare Providers:  GravelBags.it  This test Kevin no t yet approved or cleared by Kevin [place] FDA and  has been authorized for detection and/or diagnosis of SARS-CoV-2 by FDA under an Emergency Use Authorization (EUA). This EUA will remain  in effect (meaning this test can be used) for Kevin duration of Kevin COVID-19 declaration under  Section 564(b)(1) of Kevin Act, 21 U.S.C. section 360bbb-3(b)(1), unless Kevin authorization Kevin terminated or revoked sooner.     Influenza A by PCR NEGATIVE NEGATIVE   Influenza B by PCR NEGATIVE NEGATIVE    Comment: (NOTE) Kevin Xpert Xpress SARS-CoV-2/FLU/RSV assay Kevin intended as an aid in  Kevin diagnosis of influenza from Nasopharyngeal swab specimens and  should not be used as a sole basis for treatment. Nasal washings and  aspirates are unacceptable for Xpert Xpress SARS-CoV-2/FLU/RSV  testing.  Fact Sheet for Patients: PinkCheek.be  Fact Sheet for Healthcare Providers: GravelBags.it  This test Kevin not yet approved or cleared by Kevin [place] FDA and  has been authorized for detection and/or diagnosis of SARS-CoV-2 by  FDA under an Emergency Use Authorization (EUA). This EUA will remain  in effect (meaning this test can be used) for Kevin duration of Kevin  Covid-19 declaration under Section 564(b)(1) of Kevin Act, 21  U.S.C. section  360bbb-3(b)(1), unless Kevin authorization Kevin  terminated or revoked. Performed at [facility], [address], [place], [place] [address]   Lipid panel     Status: Abnormal   Collection Time: 07/30/20  3:31 AM  Result Value Ref Range   Cholesterol 127 0 - 200 mg/dL   Triglycerides 103 <150 mg/dL   HDL 30 (L) >40 mg/dL   Total CHOL/HDL Ratio 4.2 RATIO   VLDL 21 0 - 40 mg/dL   LDL Cholesterol 76 0 - 99 mg/dL    Comment:        Total Cholesterol/HDL:CHD Risk Coronary Heart Disease Risk Table                     Men   Women  1/2 Average Risk   3.4   3.3  Average Risk       5.0   4.4  2 X Average Risk   9.6   7.1  3 X Average Risk  23.4   11.0        Use Kevin calculated Matthews Ratio above and Kevin CHD Risk Table to determine Kevin Matthews's CHD Risk.        ATP III CLASSIFICATION (LDL):  <100     mg/dL   Optimal  100-129  mg/dL   Near or Above                    Optimal  130-159  mg/dL   Borderline  160-189  mg/dL   High  >190     mg/dL   Very High Performed at [facility], [address], [place], [place] [address]     Studies/Results:  HEAD CT  FINDINGS: Brain: There Kevin a new area of cytotoxic edema and loss of gray-white differentiation involving Kevin posterior left temporal lobe and lateral occipital lobe, compatible with acute or early subacute infarct. Mild local mass effect with sulcal effacement. No midline shift. Basal cisterns are patent. No acute hemorrhage. Similar focal hypodensity in Kevin inferior left basal ganglia, likely a dilated perivascular space. No hydrocephalus.  Vascular: No hyperdense vessel identified. Calcific atherosclerosis.  Skull: Normal. Negative for fracture or focal lesion.  Sinuses/Orbits: Visualized sinuses are clear. No acute orbital abnormality.  Other: No mastoid effusion.  IMPRESSION: Acute or early subacute infarct in Kevin posterior left temporal and lateral occipital lobe. Sulcal effacement without midline shift.  No evidence of hemorrhagic transformation. Consider MRI for further Evaluation.       HEAD & NECK CTA FINDINGS: CTA NECK  FINDINGS  SKELETON: There Kevin no bony spinal canal stenosis. No lytic or blastic lesion.  OTHER NECK: Normal pharynx, larynx and major salivary glands. No cervical lymphadenopathy. Unremarkable thyroid gland.  UPPER CHEST: No pneumothorax or pleural effusion. No nodules or masses.  AORTIC ARCH:  There Kevin mild calcific atherosclerosis of Kevin aortic arch. There Kevin no aneurysm, dissection or hemodynamically significant stenosis of Kevin visualized portion of Kevin aorta. Conventional 3 vessel aortic branching pattern. Kevin visualized proximal subclavian arteries are widely patent.  RIGHT CAROTID SYSTEM: Normal without aneurysm, dissection or stenosis.  LEFT CAROTID SYSTEM: Normal without aneurysm, dissection or stenosis.  VERTEBRAL ARTERIES: Left dominant configuration. Both origins are clearly patent. There Kevin no dissection, occlusion or flow-limiting stenosis to Kevin skull base (V1-V3 segments).  CTA HEAD FINDINGS  POSTERIOR CIRCULATION:  --Vertebral arteries: Normal V4 segments.  --Inferior cerebellar arteries: Normal.  --Basilar artery: Normal.  --Superior cerebellar arteries: Normal.  --Posterior cerebral arteries (PCA): Normal.  ANTERIOR CIRCULATION:  --Intracranial internal carotid arteries: Atherosclerotic calcification of Kevin internal carotid arteries at Kevin skull base without hemodynamically significant stenosis.  --Anterior cerebral arteries (ACA): Normal. Both A1 segments are present. Patent anterior communicating artery (a-comm).  --Middle cerebral arteries (MCA): Normal.  VENOUS SINUSES: As permitted by contrast timing, patent.  ANATOMIC VARIANTS: None  Review of Kevin MIP images confirms Kevin above findings.  IMPRESSION: No emergent large vessel occlusion or high-grade stenosis of Kevin intracranial or  cervical arteries.      TTE 1. Left ventricular ejection fraction, by estimation, Kevin 55 to 60%. Kevin  left ventricle has normal function. Kevin left ventricle has no regional  wall motion abnormalities. Left ventricular diastolic parameters are  indeterminate.  2. Right ventricular systolic function Kevin normal. Kevin right ventricular  size Kevin normal. Tricuspid regurgitation signal Kevin inadequate for assessing  PA pressure.  3. Kevin mitral valve Kevin grossly normal. Mild mitral valve regurgitation.  4. Kevin aortic valve Kevin tricuspid. There Kevin moderate calcification of Kevin  aortic valve. Aortic valve regurgitation Kevin not visualized. Mild to  moderate aortic valve sclerosis/calcification Kevin present, without any  evidence of aortic stenosis.  5. Kevin inferior vena cava Kevin normal in size with greater than 50%  respiratory variability, suggesting right atrial pressure of 3 mmHg.    Kevin head CT scan Kevin reviewed in person in shows wedge-shaped hypodensity involving Kevin left temple parietal area.   Jaidyn Usery A. Merlene Laughter, M.D.  Diplomate, Tax adviser of Psychiatry and Neurology ( Neurology). 07/30/2020, 7:17 AM

## 2020-07-30 NOTE — Evaluation (Signed)
Speech Language Pathology Evaluation Patient Details Name: JOSEH SJOGREN MRN: [id number] DOB: April 20, 1934 Today's Date: 07/30/2020 Time: 5176-1607 SLP Time Calculation (min) (ACUTE ONLY): 21 min  Problem List:  Patient Active Problem List   Diagnosis Date Noted  . Essential hypertension 07/29/2020  . Acute CVA (cerebrovascular accident) ([place]) 07/29/2020  . Toxic encephalopathy 01/15/2014  . CAP (community acquired pneumonia) 01/13/2014  . Altered mental status 01/13/2014  . Lumbar stenosis with neurogenic claudication 01/07/2014  . Lumbago 02/25/2013   Past Medical History:  Past Medical History:  Diagnosis Date  . Arthritis   . BPH (benign prostatic hyperplasia)   . GERD (gastroesophageal reflux disease)   . Heart murmur    dr Feliz Beam  . Hypercholesteremia    Past Surgical History:  Past Surgical History:  Procedure Laterality Date  . BACK SURGERY     x5 cervical plate;  Marland Kitchen CAROTID ENDARTERECTOMY Left   . CATARACT EXTRACTION W/PHACO Right 07/27/2015   Procedure: CATARACT EXTRACTION PHACO AND INTRAOCULAR LENS PLACEMENT (IOC);  Surgeon: Rutherford Guys, MD;  Location: [facility];  Service: Ophthalmology;  Laterality: Right;  CDE:9.43  . CATARACT EXTRACTION W/PHACO Left 08/10/2015   Procedure: CATARACT EXTRACTION PHACO AND INTRAOCULAR LENS PLACEMENT (IOC);  Surgeon: Rutherford Guys, MD;  Location: [facility];  Service: Ophthalmology;  Laterality: Left;  CDE:8.27  . LUMBAR LAMINECTOMY/DECOMPRESSION MICRODISCECTOMY N/A 01/07/2014   Procedure: Lumbar three-four, Lumbar four-five redo laminectomy and foraminotomy;  Surgeon: Ophelia Charter, MD;  Location: [facility];  Service: Neurosurgery;  Laterality: N/A;  Lumbar three-four, Lumbar four-five redo laminectomy and foraminotomy  . TONSILLECTOMY    . VASCULAR SURGERY     HPI:  Conlan Miceli  is a 86 y.o. male, with past medical history of hypertension(orthostasis as well), hyperlipidemia, GERD, BPH, presents to ED secondary to  complaints of intermittent blurry vision, and difficulty with reading, patient reports symptom for last 48 hours, ports words he is familiar with, he can identify, but reports he is unable to recognize individual letters, which caused him significant problem of reading his newspaper for last 2 days, otherwise he denies any other focal deficits, denies focal weakness, any tingling or numbness, no slurred speech, no altered mental status, he denies any particular visual changes. In ED patient with brain stimulator, so MRI could not be done, CT head was significant for acute/subacute left posterior temporal/occipital lobe infarcts, blood work significant for mild leukocytosis at 12.2, patient received full dose aspirin in ED, [facility] hospitalist consulted to admit.   Assessment / Plan / Recommendation Clinical Impression  Speech and Language evaluation completed while Pt was sitting upright in chair and Pt's wife was present. Pt's speech is at baseline, naming, conversational communication and cognition is also at baseline, however reading and written language are moderate to severely affected. Pt was able to read basic single words and or phrases, however with increasing length of sentences Pt was unable to read words often saying a similar but incorrect word. Pt reports and demonstrated inability to type letters on a device (selecting the incorrect letter 50-75% of the time), he reports he typed on his iPad "all the time"; he is physically unable to write and was unable to do this prior to this incident so written language was not assessed. Defer to OT for further recommendations regarding written/reading communication eval and treatment. There are no further ST needs at this time. ST to sign off, thank you.    SLP Assessment  SLP Recommendation/Assessment: Patient  does not need any further Speech Lanaguage Pathology Services SLP Visit Diagnosis: Cognitive communication deficit (R41.841)             SLP  Evaluation Cognition  Overall Cognitive Status: Within Functional Limits for tasks assessed       Comprehension  Auditory Comprehension Overall Auditory Comprehension: Appears within functional limits for tasks assessed Visual Recognition/Discrimination Discrimination: Exceptions to [facility] Reading Comprehension Reading Status: Impaired Word level: Impaired Sentence Level: Impaired Paragraph Level: Impaired Functional Environmental (signs, name badge): Within functional limits    Expression Expression Primary Mode of Expression: Verbal Verbal Expression Overall Verbal Expression: Appears within functional limits for tasks assessed Written Expression Dominant Hand: Left Written Expression: Exceptions to [facility]   Oral / Motor  Oral Motor/Sensory Function Overall Oral Motor/Sensory Function: Within functional limits Motor Speech Overall Motor Speech: Appears within functional limits for tasks assessed       Bowman Higbie H. Roddie Mc, CCC-SLP Speech Language Pathologist            Wende Bushy 07/30/2020, 12:37 PM

## 2020-07-30 NOTE — Progress Notes (Signed)
PROGRESS NOTE    Kevin Matthews  VQM:[id number] DOB: [birth year] DOA: 07/29/2020 PCP: [facility]    Chief Complaint  Patient presents with  . Blurred Vision    Brief Narrative:  As per H&P written by Dr. Waldron Labs on 07/29/2020 Kevin Matthews  is a 86 y.o. male, with past medical history of hypertension(orthostasis as well), hyperlipidemia, GERD, BPH, presents to ED secondary to complaints of intermittent blurry vision, and difficulty with reading, patient reports symptoms for last 48 hours, reports words he is familiar with the letters individually, but unable to read then together or understand meanings. Patient otherwise denies any other focal deficits, any tingling or numbness, no slurred speech, no altered mental status, he denies any particular visual changes. -in ED patient with brain stimulator, so MRI could not be done, CT head was significant for acute/subacute left posterior temporal/occipital lobe infarcts, blood work significant for mild leukocytosis at 12.2, he denies any fever or chills, patient received full dose aspirin in ED, [facility] hospitalist consulted to admit.  Of note, patient with chronic back pain and spondylitis at baseline.   Assessment & Plan: 1-acute CVA: Left temporal/occipital acute/subacute stroke appreciated on images. -Unable to perform MRI secondary to spinal stimulator. -CT a head and neck without large vessel occlusion -2D echo unrevealing of emboli or significant valvular disorder; preserved ejection fraction. 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 -Per neurology recommendations we will do stool antiplatelet therapy for 4 weeks and following that continue the use of Plavix. -During his evaluation patient demonstrated left lower extremity moderate paresis and recommendations for repeat CT with contrast provided. -PT/OT has seen patient and has  recommended home health services. -Will complete a stroke work-up/images instructed by neurologist and hopefully discharge home on 07/31/2020.  2-essential hypertension -Home antihypertensive regimen -Heart healthy diet has been ordered.  3-hyperlipidemia -Continue statins  4-BPH -Continue home medication  5-chronic back pain -Continue the use of gabapentin -Continue outpatient follow-up with his PCP -Patient is status post a spinal stimulator implantation.  6-GERD -continue PPI  7-allergic rhinitis -continue loratadine   DVT prophylaxis: Heparin Code Status: Full code. Family Communication: Wife at bedside. Disposition:   Status is: Inpatient  Dispo: The patient is from: home               Anticipated d/c is to: home              Anticipated d/c date is: 1 day              Patient currently is not medically stable for discharge.  At this moment will complete stroke work-up and follow recommendation by PT/OT.  Per neurology recommendation we will repeat CT head with contrast.  Plan will be for dual antiplatelet therapy and outpatient follow-up with neurology service.     Consultants:   Neurology service  Procedures:  See below for x-ray reports.   Antimicrobials: None   Subjective: Afebrile, no chest pain, no nausea, no vomiting.  Still having difficulty reading and with left mild/moderate paresis.  Objective: Vitals:   07/30/20 1300 07/30/20 1400 07/30/20 1500 07/30/20 1600  BP: (!) 178/90 130/77 120/78 106/71  Pulse: (!) 57 65 60 66  Resp: 17 19 15 20   Temp:      TempSrc:      SpO2: 93% 94% 93% 92%  Weight:      Height:       No intake or output  data in the 24 hours ending 07/30/20 1755 Filed Weights   07/29/20 1326  Weight: 74.8 kg    Examination:  General exam: Appears calm and comfortable; patient still having difficulty breathing and mild left lower extremity with moderate paresis.  No chest pain, no nausea, no vomiting.  Speaking in full  sentences, understanding his current situation and demonstrating no pronation or any other focal deficits. Respiratory system: Clear to auscultation. Respiratory effort normal.  No requiring oxygen supplementation. Cardiovascular system: S1 & S2 heard, RRR. No JVD, rubs, gallops or clicks. No pedal edema. Gastrointestinal system: Abdomen is nondistended, soft and nontender. No organomegaly or masses felt. Normal bowel sounds heard. Central nervous system: Alert and oriented.  Having difficulty reading and demonstrating mild left lower extremity paresis. Extremities: Mild left lower extremity paresis; no cyanosis, no clubbing, no edema. Skin: No rashes, no petechiae. Psychiatry: Judgement and insight appear normal. Mood & affect appropriate.     Data Reviewed: I have personally reviewed following labs and imaging studies  CBC: Recent Labs  Lab 07/29/20 1506  WBC 12.2*  NEUTROABS 7.6  HGB 11.1*  HCT 34.9*  MCV 89.9  PLT 902    Basic Metabolic Panel: Recent Labs  Lab 07/29/20 1506  NA 135  K 3.9  CL 96*  CO2 27  GLUCOSE 94  BUN 16  CREATININE 1.03  CALCIUM 9.0    GFR: Estimated Creatinine Clearance: 53.4 mL/min (by C-G formula based on SCr of 1.03 mg/dL).  Liver Function Tests: Recent Labs  Lab 07/29/20 1506  AST 36  ALT 15  ALKPHOS 69  BILITOT 1.0  PROT 7.2  ALBUMIN 3.3*    CBG: No results for input(s): GLUCAP in the last 168 hours.   Recent Results (from the past 240 hour(s))  Respiratory Panel by RT PCR (Flu A&B, Covid) - Nasopharyngeal Swab     Status: None   Collection Time: 07/29/20  4:26 PM   Specimen: Nasopharyngeal Swab  Result Value Ref Range Status   SARS Coronavirus 2 by RT PCR NEGATIVE NEGATIVE Final    Comment: (NOTE) SARS-CoV-2 target nucleic acids are NOT DETECTED.  The SARS-CoV-2 RNA is generally detectable in upper respiratoy specimens during the acute phase of infection. The lowest concentration of SARS-CoV-2 viral copies this assay  can detect is 131 copies/mL. A negative result does not preclude SARS-Cov-2 infection and should not be used as the sole basis for treatment or other patient management decisions. A negative result may occur with  improper specimen collection/handling, submission of specimen other than nasopharyngeal swab, presence of viral mutation(s) within the areas targeted by this assay, and inadequate number of viral copies (<131 copies/mL). A negative result must be combined with clinical observations, patient history, and epidemiological information. The expected result is Negative.  Fact Sheet for Patients:  PinkCheek.be  Fact Sheet for Healthcare Providers:  GravelBags.it  This test is no t yet approved or cleared by the [place] FDA and  has been authorized for detection and/or diagnosis of SARS-CoV-2 by FDA under an Emergency Use Authorization (EUA). This EUA will remain  in effect (meaning this test can be used) for the duration of the COVID-19 declaration under Section 564(b)(1) of the Act, 21 U.S.C. section 360bbb-3(b)(1), unless the authorization is terminated or revoked sooner.     Influenza A by PCR NEGATIVE NEGATIVE Final   Influenza B by PCR NEGATIVE NEGATIVE Final    Comment: (NOTE) The Xpert Xpress SARS-CoV-2/FLU/RSV assay is intended as an aid in  the diagnosis of influenza from Nasopharyngeal swab specimens and  should not be used as a sole basis for treatment. Nasal washings and  aspirates are unacceptable for Xpert Xpress SARS-CoV-2/FLU/RSV  testing.  Fact Sheet for Patients: PinkCheek.be  Fact Sheet for Healthcare Providers: GravelBags.it  This test is not yet approved or cleared by the [place] FDA and  has been authorized for detection and/or diagnosis of SARS-CoV-2 by  FDA under an Emergency Use Authorization (EUA). This EUA will remain    in effect (meaning this test can be used) for the duration of the  Covid-19 declaration under Section 564(b)(1) of the Act, 21  U.S.C. section 360bbb-3(b)(1), unless the authorization is  terminated or revoked. Performed at [facility], [address], [place], [place] [address]      Radiology Studies: CT ANGIO HEAD W OR WO CONTRAST  Result Date: 07/29/2020 CLINICAL DATA:  Blurry vision. EXAM: CT ANGIOGRAPHY HEAD AND NECK TECHNIQUE: Multidetector CT imaging of the head and neck was performed using the standard protocol during bolus administration of intravenous contrast. Multiplanar CT image reconstructions and MIPs were obtained to evaluate the vascular anatomy. Carotid stenosis measurements (when applicable) are obtained utilizing NASCET criteria, using the distal internal carotid diameter as the denominator. CONTRAST:  168mL OMNIPAQUE IOHEXOL 350 MG/ML SOLN COMPARISON:  None. FINDINGS: CTA NECK FINDINGS SKELETON: There is no bony spinal canal stenosis. No lytic or blastic lesion. OTHER NECK: Normal pharynx, larynx and major salivary glands. No cervical lymphadenopathy. Unremarkable thyroid gland. UPPER CHEST: No pneumothorax or pleural effusion. No nodules or masses. AORTIC ARCH: There is mild calcific atherosclerosis of the aortic arch. There is no aneurysm, dissection or hemodynamically significant stenosis of the visualized portion of the aorta. Conventional 3 vessel aortic branching pattern. The visualized proximal subclavian arteries are widely patent. RIGHT CAROTID SYSTEM: Normal without aneurysm, dissection or stenosis. LEFT CAROTID SYSTEM: Normal without aneurysm, dissection or stenosis. VERTEBRAL ARTERIES: Left dominant configuration. Both origins are clearly patent. There is no dissection, occlusion or flow-limiting stenosis to the skull base (V1-V3 segments). CTA HEAD FINDINGS POSTERIOR CIRCULATION: --Vertebral arteries: Normal V4 segments. --Inferior cerebellar arteries: Normal. --Basilar  artery: Normal. --Superior cerebellar arteries: Normal. --Posterior cerebral arteries (PCA): Normal. ANTERIOR CIRCULATION: --Intracranial internal carotid arteries: Atherosclerotic calcification of the internal carotid arteries at the skull base without hemodynamically significant stenosis. --Anterior cerebral arteries (ACA): Normal. Both A1 segments are present. Patent anterior communicating artery (a-comm). --Middle cerebral arteries (MCA): Normal. VENOUS SINUSES: As permitted by contrast timing, patent. ANATOMIC VARIANTS: None Review of the MIP images confirms the above findings. IMPRESSION: No emergent large vessel occlusion or high-grade stenosis of the intracranial or cervical arteries. Aortic atherosclerosis (ICD10-I70.0). Electronically Signed   By: Ulyses Jarred M.D.   On: 07/29/2020 23:10   DG Chest 2 View  Result Date: 07/29/2020 CLINICAL DATA:  Blurry vision. EXAM: CHEST - 2 VIEW COMPARISON:  January 14, 2014 FINDINGS: Mild areas of atelectasis and/or infiltrate are seen within the mid right lung and right lung base. There is no evidence of a pleural effusion or pneumothorax. The heart size and mediastinal contours are within normal limits. There is mild calcification of the aortic arch. A radiopaque fusion plate and screws are seen overlying the lower cervical spine. Mild to moderate severity dextroscoliosis of the upper thoracic spine is noted. IMPRESSION: Mild mid right lung and right basilar atelectasis and/or infiltrate. Electronically Signed   By: Virgina Norfolk M.D.   On: 07/29/2020 23:06   CT HEAD WO CONTRAST  Result Date: 07/29/2020 CLINICAL DATA:  Possible TIA versus stroke. Weakness. Difficulty discerning words. EXAM: CT HEAD WITHOUT CONTRAST TECHNIQUE: Contiguous axial images were obtained from the base of the skull through the vertex without intravenous contrast. COMPARISON:  CT head January 13, 2014 FINDINGS: Brain: There is a new area of cytotoxic edema and loss of gray-white  differentiation involving the posterior left temporal lobe and lateral occipital lobe, compatible with acute or early subacute infarct. Mild local mass effect with sulcal effacement. No midline shift. Basal cisterns are patent. No acute hemorrhage. Similar focal hypodensity in the inferior left basal ganglia, likely a dilated perivascular space. No hydrocephalus. Vascular: No hyperdense vessel identified. Calcific atherosclerosis. Skull: Normal. Negative for fracture or focal lesion. Sinuses/Orbits: Visualized sinuses are clear. No acute orbital abnormality. Other: No mastoid effusion. IMPRESSION: Acute or early subacute infarct in the posterior left temporal and lateral occipital lobe. Sulcal effacement without midline shift. No evidence of hemorrhagic transformation. Consider MRI for further evaluation. These findings were discussed with Dr. Eulis Foster at 4:20 p.m. via telephone. Electronically Signed   By: Margaretha Sheffield MD   On: 07/29/2020 16:23   CT ANGIO NECK W OR WO CONTRAST  Result Date: 07/29/2020 CLINICAL DATA:  Blurry vision. EXAM: CT ANGIOGRAPHY HEAD AND NECK TECHNIQUE: Multidetector CT imaging of the head and neck was performed using the standard protocol during bolus administration of intravenous contrast. Multiplanar CT image reconstructions and MIPs were obtained to evaluate the vascular anatomy. Carotid stenosis measurements (when applicable) are obtained utilizing NASCET criteria, using the distal internal carotid diameter as the denominator. CONTRAST:  152mL OMNIPAQUE IOHEXOL 350 MG/ML SOLN COMPARISON:  None. FINDINGS: CTA NECK FINDINGS SKELETON: There is no bony spinal canal stenosis. No lytic or blastic lesion. OTHER NECK: Normal pharynx, larynx and major salivary glands. No cervical lymphadenopathy. Unremarkable thyroid gland. UPPER CHEST: No pneumothorax or pleural effusion. No nodules or masses. AORTIC ARCH: There is mild calcific atherosclerosis of the aortic arch. There is no aneurysm,  dissection or hemodynamically significant stenosis of the visualized portion of the aorta. Conventional 3 vessel aortic branching pattern. The visualized proximal subclavian arteries are widely patent. RIGHT CAROTID SYSTEM: Normal without aneurysm, dissection or stenosis. LEFT CAROTID SYSTEM: Normal without aneurysm, dissection or stenosis. VERTEBRAL ARTERIES: Left dominant configuration. Both origins are clearly patent. There is no dissection, occlusion or flow-limiting stenosis to the skull base (V1-V3 segments). CTA HEAD FINDINGS POSTERIOR CIRCULATION: --Vertebral arteries: Normal V4 segments. --Inferior cerebellar arteries: Normal. --Basilar artery: Normal. --Superior cerebellar arteries: Normal. --Posterior cerebral arteries (PCA): Normal. ANTERIOR CIRCULATION: --Intracranial internal carotid arteries: Atherosclerotic calcification of the internal carotid arteries at the skull base without hemodynamically significant stenosis. --Anterior cerebral arteries (ACA): Normal. Both A1 segments are present. Patent anterior communicating artery (a-comm). --Middle cerebral arteries (MCA): Normal. VENOUS SINUSES: As permitted by contrast timing, patent. ANATOMIC VARIANTS: None Review of the MIP images confirms the above findings. IMPRESSION: No emergent large vessel occlusion or high-grade stenosis of the intracranial or cervical arteries. Aortic atherosclerosis (ICD10-I70.0). Electronically Signed   By: Ulyses Jarred M.D.   On: 07/29/2020 23:10   US Carotid Bilateral (at [facility] and [facility] only)  Result Date: 07/30/2020 CLINICAL DATA:  Stroke symptoms, carotid atherosclerosis, previous left carotid endarterectomy EXAM: BILATERAL CAROTID DUPLEX ULTRASOUND TECHNIQUE: Pearline Cables scale imaging, color Doppler and duplex ultrasound were performed of bilateral carotid and vertebral arteries in the neck. COMPARISON:  07/29/2020 FINDINGS: Criteria: Quantification of carotid stenosis is based on velocity parameters that correlate the  residual internal carotid diameter  with NASCET-based stenosis levels, using the diameter of the distal internal carotid lumen as the denominator for stenosis measurement. The following velocity measurements were obtained: RIGHT ICA: 233/47 cm/sec CCA: 43/32 cm/sec SYSTOLIC ICA/CCA RATIO:  4.6 ECA: 87 cm/sec LEFT ICA: 122/41 cm/sec CCA: 95/18 cm/sec SYSTOLIC ICA/CCA RATIO:  2.5 ECA: 60 cm/sec RIGHT CAROTID ARTERY: Heterogeneous irregular calcified right carotid bifurcation atherosclerosis. Proximal ICA velocity elevation measures up to 233/47 centimeters/second with spectral broadening and mild turbulent flow. Right ICA stenosis estimated at greater than 70% by ultrasound criteria. RIGHT VERTEBRAL ARTERY:  Normal antegrade flow LEFT CAROTID ARTERY: Similar scattered minor echogenic plaque formation. No hemodynamically significant left ICA stenosis, velocity elevation, or turbulent flow. LEFT VERTEBRAL ARTERY:  Normal antegrade flow IMPRESSION: Right ICA stenosis estimated at greater than 70% by ultrasound criteria. Left ICA narrowing less than 50% Normal antegrade vertebral flow bilaterally Electronically Signed   By: Jerilynn Mages.  Shick M.D.   On: 07/30/2020 10:33   ECHOCARDIOGRAM COMPLETE  Result Date: 07/30/2020    ECHOCARDIOGRAM REPORT   Patient Name:   DAWSEN KRIEGER Date of Exam: 07/30/2020 Medical Rec #:  [id number]         Height:       69.0 in Accession #:    [id number]        Weight:       165.0 lb Date of Birth:  1934-03-30         BSA:          1.904 m Patient Age:    34 years          BP:           158/101 mmHg Patient Gender: M                 HR:           71 bpm. Exam Location:  [facility] Procedure: 2D Echo, Cardiac Doppler and Color Doppler Indications:    Stroke 434.91 / I163.9  History:        Patient has no prior history of Echocardiogram examinations.                 Risk Factors:Hypertension and Dyslipidemia.  Sonographer:    Alvino Chapel RCS Referring Phys: [place]  1.  Left ventricular ejection fraction, by estimation, is 55 to 60%. The left ventricle has normal function. The left ventricle has no regional wall motion abnormalities. Left ventricular diastolic parameters are indeterminate.  2. Right ventricular systolic function is normal. The right ventricular size is normal. Tricuspid regurgitation signal is inadequate for assessing PA pressure.  3. The mitral valve is grossly normal. Mild mitral valve regurgitation.  4. The aortic valve is tricuspid. There is moderate calcification of the aortic valve. Aortic valve regurgitation is not visualized. Mild to moderate aortic valve sclerosis/calcification is present, without any evidence of aortic stenosis.  5. The inferior vena cava is normal in size with greater than 50% respiratory variability, suggesting right atrial pressure of 3 mmHg. FINDINGS  Left Ventricle: Left ventricular ejection fraction, by estimation, is 55 to 60%. The left ventricle has normal function. The left ventricle has no regional wall motion abnormalities. The left ventricular internal cavity size was normal in size. There is  no left ventricular hypertrophy. Left ventricular diastolic parameters are indeterminate. Right Ventricle: The right ventricular size is normal. No increase in right ventricular wall thickness. Right ventricular systolic function is normal. Tricuspid regurgitation signal is inadequate for assessing PA  pressure. Left Atrium: Left atrial size was normal in size. Right Atrium: Right atrial size was normal in size. Pericardium: There is no evidence of pericardial effusion. Presence of pericardial fat pad. Mitral Valve: The mitral valve is grossly normal. Mild mitral valve regurgitation. Tricuspid Valve: The tricuspid valve is grossly normal. Tricuspid valve regurgitation is trivial. Aortic Valve: The aortic valve is tricuspid. There is moderate calcification of the aortic valve. There is moderate aortic valve annular calcification. Aortic  valve regurgitation is not visualized. Mild to moderate aortic valve sclerosis/calcification is  present, without any evidence of aortic stenosis. Pulmonic Valve: The pulmonic valve was grossly normal. Pulmonic valve regurgitation is trivial. Aorta: The aortic root is normal in size and structure. Venous: The inferior vena cava is normal in size with greater than 50% respiratory variability, suggesting right atrial pressure of 3 mmHg. IAS/Shunts: No atrial level shunt detected by color flow Doppler.  LEFT VENTRICLE PLAX 2D LVIDd:         4.82 cm  Diastology LVIDs:         3.58 cm  LV e' medial:    3.70 cm/s LV PW:         0.95 cm  LV E/e' medial:  13.5 LV IVS:        1.07 cm  LV e' lateral:   5.44 cm/s LVOT diam:     1.90 cm  LV E/e' lateral: 9.2 LV SV:         55 LV SV Index:   29 LVOT Area:     2.84 cm  RIGHT VENTRICLE RV S prime:     17.50 cm/s TAPSE (M-mode): 2.5 cm LEFT ATRIUM             Index       RIGHT ATRIUM           Index LA diam:        3.00 cm 1.58 cm/m  RA Area:     16.10 cm LA Vol (A2C):   37.1 ml 19.49 ml/m RA Volume:   40.50 ml  21.27 ml/m LA Vol (A4C):   27.2 ml 14.29 ml/m LA Biplane Vol: 31.6 ml 16.60 ml/m  AORTIC VALVE LVOT Vmax:   104.00 cm/s LVOT Vmean:  64.400 cm/s LVOT VTI:    0.194 m  AORTA Ao Root diam: 3.50 cm MITRAL VALVE MV Area (PHT): 1.78 cm     SHUNTS MV Decel Time: 425 msec     Systemic VTI:  0.19 m MV E velocity: 50.00 cm/s   Systemic Diam: 1.90 cm MV A velocity: 114.00 cm/s MV E/A ratio:  0.44 Rozann Lesches MD Electronically signed by Rozann Lesches MD Signature Date/Time: 07/30/2020/10:33:22 AM    Final     Scheduled Meds: .  stroke: mapping our early stages of recovery book   Does not apply Once  . aspirin  325 mg Oral Daily  . cholecalciferol  2,000 Units Oral Daily  . [START ON 07/31/2020] clopidogrel  75 mg Oral Q breakfast  . gabapentin  600 mg Oral TID  . heparin  5,000 Units Subcutaneous Q8H  . loratadine  10 mg Oral Daily  . multivitamin with minerals   1 tablet Oral Daily  . pantoprazole  40 mg Oral Daily  . pravastatin  80 mg Oral Daily  . tamsulosin  0.4 mg Oral QPC supper   Continuous Infusions: . sodium chloride 50 mL/hr at 07/30/20 1721     LOS: 1 day    Time  spent: 30 minutes.   Barton Dubois, MD [facility]   To contact the attending provider between 7A-7P or the covering provider during after hours 7P-7A, please log into the web site [email or web address] and access using universal  password for that web site. If you do not have the password, please call the hospital operator.  07/30/2020, 5:55 PM

## 2020-07-30 NOTE — Progress Notes (Signed)
Notified on call NP B. Kyere to clarify head CT with contrast as patient states he had it done last night. Lorra Hals states he does need another head CT with contrast. Radiology was made aware and patient down at this time. BP elevated, MD aware of this, new order for 500 NS bolus. Will administer once patient returns.

## 2020-07-30 NOTE — Progress Notes (Signed)
*  PRELIMINARY RESULTS* Echocardiogram 2D Echocardiogram has been performed.  Kevin Matthews 07/30/2020, 9:37 AM

## 2020-07-30 NOTE — Plan of Care (Signed)
  Problem: Acute Rehab PT Goals(only PT should resolve) Goal: Pt Will Go Supine/Side To Sit Outcome: Progressing Flowsheets (Taken 07/30/2020 1041) Pt will go Supine/Side to Sit: with supervision Goal: Patient Will Transfer Sit To/From Stand Outcome: Progressing Flowsheets (Taken 07/30/2020 1041) Patient will transfer sit to/from stand: with supervision Goal: Pt Will Transfer Bed To Chair/Chair To Bed Outcome: Progressing Flowsheets (Taken 07/30/2020 1041) Pt will Transfer Bed to Chair/Chair to Bed: with supervision Goal: Pt Will Ambulate Outcome: Progressing Flowsheets (Taken 07/30/2020 1041) Pt will Ambulate:  75 feet  with supervision  with rolling walker  with cane   10:42 AM, 07/30/20 Lonell Grandchild, MPT Physical Therapist with [facility] [phone] office [phone] mobile phone

## 2020-07-30 NOTE — Evaluation (Addendum)
Occupational Therapy Evaluation Patient Details Name: Kevin Matthews MRN: [id number] DOB: Sep 14, 1934 Today's Date: 07/30/2020    History of Present Illness Kevin Matthews  is a 86 y.o. male, with past medical history of hypertension(orthostasis as well), hyperlipidemia, GERD, BPH, presents to ED secondary to complaints of intermittent blurry vision, and difficulty with reading, patient reports symptom for last 48 hours, ports words he is familiar with, he can identify, but reports he is unable to recognize individual letters, which caused him significant problem of reading his newspaper for last 2 days, otherwise he denies any other focal deficits, denies focal weakness, any tingling or numbness, no slurred speech, no altered mental status, he denies any particular visual changes. Pt is unable to have MRI.    Clinical Impression   Pt agreeable to OT/PT co-evaluation. Pt reports no vision changes this am, however continues to have difficulty with visual perception required for reading and letter recognition. Pt is able to read a basic sentence however is unable to continue reading or type on his Ipad. Pt performing ADLs and mobility tasks at his baseline per pt and wife. Pt requiring occasional assist for LB dressing, reports decreased standing tolerance at home therefore sits for most tasks. Pt is functioning at baseline for basic ADLs at this time, recommend [facility] OT services on discharge to assess visual perception. If HH unable to assess, recommend transfer to outpatient services for vision and visual perception assessment.     Follow Up Recommendations  Home Health OT; outpatient OT   Equipment Recommendations  None recommended by OT       Precautions / Restrictions Precautions Precautions: Fall Restrictions Weight Bearing Restrictions: No      Mobility Bed Mobility               General bed mobility comments: Defer to PT note  Transfers                 General  transfer comment: Defer to PT note        ADL either performed or assessed with clinical judgement   ADL Overall ADL's : Needs assistance/impaired     Grooming: Set up;Sitting               Lower Body Dressing: Maximal assistance;Sitting/lateral leans Lower Body Dressing Details (indicate cue type and reason): pt requiring assistance due to recliner being too high for bending over Toilet Transfer: Min Fish farm manager Details (indicate cue type and reason): simulated in room         Functional mobility during ADLs: Min guard;Rolling walker       Vision Baseline Vision/History: Wears glasses Wears Glasses: At all times Patient Visual Report: No change from baseline Vision Assessment?: No apparent visual deficits            Pertinent Vitals/Pain Pain Assessment: Faces Faces Pain Scale: Hurts little more Pain Location: back Pain Descriptors / Indicators: Aching;Grimacing;Sore Pain Intervention(s): Limited activity within patient's tolerance;Monitored during session;Repositioned     Hand Dominance Left   Extremity/Trunk Assessment Upper Extremity Assessment Upper Extremity Assessment: Overall WFL for tasks assessed   Lower Extremity Assessment Lower Extremity Assessment: Defer to PT evaluation       Communication Communication Communication: No difficulties   Cognition Arousal/Alertness: Awake/alert Behavior During Therapy: WFL for tasks assessed/performed Overall Cognitive Status: Within Functional Limits for tasks assessed  Home Living Family/patient expects to be discharged to:: Private residence Living Arrangements: Spouse/significant other Available Help at Discharge: Family;Available 24 hours/day Type of Home: House Home Access: Stairs to enter [employer] of Steps: 1   Home Layout: One level     Bathroom Shower/Tub: Occupational psychologist:  Standard     Home Equipment: Environmental consultant - 2 wheels;Walker - 4 wheels;Cane - single point;Shower seat - built in;Grab bars - tub/shower          Prior Functioning/Environment Level of Independence: Needs assistance  Gait / Transfers Assistance Needed: Pt uses SPC and rollator at times ADL's / Homemaking Assistance Needed: wife is available to assist with ADLs as needed. Pt is independent with basic ADLs            OT Problem List: Decreased activity tolerance;Impaired balance (sitting and/or standing)    AM-PAC OT "6 Clicks" Daily Activity     Outcome Measure Help from another person eating meals?: None Help from another person taking care of personal grooming?: None Help from another person toileting, which includes using toliet, bedpan, or urinal?: None Help from another person bathing (including washing, rinsing, drying)?: A Little Help from another person to put on and taking off regular upper body clothing?: None Help from another person to put on and taking off regular lower body clothing?: A Little 6 Click Score: 22   End of Session Equipment Utilized During Treatment: Rolling walker  Activity Tolerance: Patient tolerated treatment well Patient left: in chair;with call bell/phone within reach;with nursing/sitter in room;with family/visitor present  OT Visit Diagnosis: Muscle weakness (generalized) (M62.81)                Time: 5916-3846 OT Time Calculation (min): 23 min Charges:  OT General Charges $OT Visit: 1 Visit OT Evaluation $OT Eval Low Complexity: 1 Low   Guadelupe Sabin, OTR/L  [phone] 07/30/2020, 9:34 AM

## 2020-07-30 NOTE — Progress Notes (Signed)
Patient arrived to unit at 6:55 pm. He ambulated to bed from stretcher. He is up x 1 assist. Urinal at bedside. Vital signs stable at 1900. NIH stroke scale is 0. Patient oriented to room and call bell. He denies any pain. He does still report blurred vision but states it is a little better than when he first went to ED. No other complaints at this time.   BP (!) 155/75 (BP Location: Right Arm)   Pulse 65   Temp 97.9 F (36.6 C) (Oral)   Resp 18   Ht 5\' 9"  (1.753 m)   Wt 74.8 kg   SpO2 99%   BMI 24.37 kg/m

## 2020-07-30 NOTE — TOC Progression Note (Signed)
Transition of Care [facility]) - Progression Note    Patient Details  Name: Kevin Matthews MRN: [id number] Date of Birth: Mar 01, 1934  Transition of Care [facility]) CM/SW [place], [place] Phone Number: 07/30/2020, 9:28 AM  Clinical Narrative:    CSW submitted notification to the [place] of pts admittance. [place] notification ID# is [phone].        Expected Discharge Plan and Services                                                 Social Determinants of Health (SDOH) Interventions    Readmission Risk Interventions No flowsheet data found.

## 2020-07-31 DIAGNOSIS — N400 Enlarged prostate without lower urinary tract symptoms: Secondary | ICD-10-CM

## 2020-07-31 DIAGNOSIS — E785 Hyperlipidemia, unspecified: Secondary | ICD-10-CM

## 2020-07-31 MED ORDER — MULTIVITAMINS PO CAPS: 1.0000 | ORAL_CAPSULE | Freq: Every day | ORAL | Status: DC

## 2020-07-31 MED ORDER — LORATADINE 10 MG PO TABS: 10.0000 mg | ORAL_TABLET | Freq: Every day | ORAL | Status: DC

## 2020-07-31 MED ORDER — HYDRALAZINE HCL 20 MG/ML IJ SOLN
10.0000 mg | Freq: Once | INTRAMUSCULAR | Status: DC
  Filled 2020-07-31: qty 1

## 2020-07-31 MED ORDER — CLOPIDOGREL BISULFATE 75 MG PO TABS: 75.0000 mg | ORAL_TABLET | Freq: Every day | ORAL | 2 refills | Status: AC

## 2020-07-31 MED ORDER — ASPIRIN 325 MG PO TABS: 325.0000 mg | ORAL_TABLET | Freq: Every day | ORAL | 0 refills | Status: AC

## 2020-07-31 NOTE — Progress Notes (Signed)
BP 185/81, notified on call Dr. Earnest Conroy. New order for hydralazine 10mg . Re-checked BP before administering, BP 138/76, did not administer. Will continue to monitor.

## 2020-07-31 NOTE — Discharge Summary (Signed)
Physician Discharge Summary  Kevin Matthews ZOX:[id number] DOB: August 21, 1934 DOA: 07/29/2020  PCP: [facility]  Admit date: 07/29/2020 Discharge date: 07/31/2020  Time spent: 35 minutes  Recommendations for Outpatient Follow-up:  1. Repeat basic metabolic panel to evaluate lites and renal function 2. Reassess blood pressure and adjust antihypertensive treatment as needed  Discharge Diagnoses:  Active Problems:   Essential hypertension   Cerebrovascular accident (CVA) ([place])   Hyperlipidemia   Benign prostatic hyperplasia without lower urinary tract symptoms   Discharge Condition: Stable and improved.  Discharged home with instruction to follow-up with PCP and neurology as an outpatient.  Home health services for PT/OT arranged at discharge.  CODE STATUS: Full code.  Diet recommendation: Heart healthy diet.  Filed Weights   07/29/20 1326  Weight: 74.8 kg    History of present illness:  As per H&P written by Dr. Waldron Labs on 07/29/2020 RaymondHoskinsis a86 y.o.male,with past medical history of hypertension(orthostasis as well), hyperlipidemia, GERD, BPH, presents to ED secondary to complaints of intermittent blurry vision, and difficulty with reading, patient reports symptoms for last 48 hours, reports words he is familiar with the letters individually, but unable to read then together or understand meanings. Patient otherwise denies any other focal deficits, any tingling or numbness, no slurred speech, no altered mental status, he denies any particular visual changes. -in EDpatient with brain stimulator, so MRI could not be done, CT head was significant for acute/subacute left posterior temporal/occipital lobe infarcts, blood work significant for mild leukocytosis at 12.2, he denies any fever or chills, patient received full dose aspirin in ED,TRIADhospitalist consulted to admit.  Of note, patient with chronic back pain and spondylitis at  baseline.  Hospital Course:  1-acute CVA: Left temporal/occipital acute/subacute stroke appreciated on images. -Unable to perform MRI secondary to spinal stimulator. -CT a head and neck without large vessel occlusion -2D echo unrevealing of emboli or significant valvular disorder; preserved ejection fraction. 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 -Per neurology recommendations will follow dual antiplatelet therapy for 4 weeks and after that continue the use of Plavix. -During neurology evaluation patient demonstrated left lower extremity moderate paresis and recommendations for repeat CT with contrast provided; repeat images demonstrated no acute or normalities and is stable previous identified stroke without hemorrhagic conversion.  EMG And nerve conduction study recommended at this point as an outpatient by neurology service. -PT/OT has seen patient and has recommended home health services. -Will complete a stroke work-up/images instructed by neurologist and hopefully discharge home on 07/31/2020.  2-essential hypertension -resume Home antihypertensive regimen -Heart healthy diet has been encouraged  3-hyperlipidemia -Continue statins  4-BPH -Continue home medication  5-chronic back pain -Continue the use of gabapentin -Continue outpatient follow-up with his PCP -Patient is status post spinal stimulator implantation.  6-GERD -continue PPI  7-allergic rhinitis -continue loratadine  Procedures:  See below for x-ray reports   2D echo: 1. Left ventricular ejection fraction, by estimation, is 55 to 60%. The  left ventricle has normal function. The left ventricle has no regional  wall motion abnormalities. Left ventricular diastolic parameters are  indeterminate.  2. Right ventricular systolic function is normal. The right ventricular  size is normal. Tricuspid regurgitation signal is  inadequate for assessing  PA pressure.  3. The mitral valve is grossly normal. Mild mitral valve regurgitation.  4. The aortic valve is tricuspid. There is moderate calcification of the  aortic valve. Aortic valve regurgitation is not  visualized. Mild to  moderate aortic valve sclerosis/calcification is present, without any  evidence of aortic stenosis.  5. The inferior vena cava is normal in size with greater than 50%  respiratory variability, suggesting right atrial pressure of 3 mmHg.    Carotid Dopplers: 1-Right ICA stenosis estimated at greater than 70% by ultrasound criteria. 2-Left ICA narrowing less than 50% 3-Normal antegrade vertebral flow bilaterally  Consultations:  Neurology  Discharge Exam: Vitals:   07/31/20 0500 07/31/20 0831  BP: 138/76 (!) 146/85  Pulse: 87 77  Resp: 20 18  Temp: 98.5 F (36.9 C) 98 F (36.7 C)  SpO2: 91% 98%    General: Afebrile, no chest pain, no nausea, no vomiting.  In no acute distress.  Hemodynamically stable. Cardiovascular: S1 and S2, no rubs, no gallops, no JVD on exam. Respiratory: Good air movement bilaterally; no using accessory muscles.  Good O2 sat on room air. Abdomen: Soft, nontender, distended, positive bowel sounds Extremities: No cyanosis or clubbing.  Discharge Instructions    Allergies as of 07/31/2020      Reactions   Oxycodone Other (See Comments)   States that when he took oxycodone he couldn't move out of the bed.      Medication List    STOP taking these medications   predniSONE 10 MG tablet Commonly known as: DELTASONE     TAKE these medications   acetaminophen 500 MG tablet Commonly known as: TYLENOL Take 1,000 mg by mouth every 6 (six) hours as needed.   aspirin 325 MG tablet Take 1 tablet (325 mg total) by mouth daily. What changed: Another medication with the same name was removed. Continue taking this medication, and follow the directions you see here.   clopidogrel 75 MG  tablet Commonly known as: PLAVIX Take 1 tablet (75 mg total) by mouth daily with breakfast.   docusate sodium 100 MG capsule Commonly known as: COLACE Take 100 mg by mouth 2 (two) times daily.   gabapentin 400 MG capsule Commonly known as: NEURONTIN Take 400 mg by mouth 3 (three) times daily.   hydrALAZINE 10 MG tablet Commonly known as: APRESOLINE Take 10 mg by mouth See admin instructions. Take 1/2 tablet by mouth if BP 120 plus standing, or if BP 180 plus sitting. Take 1 tablet by mouth daily as directed.   ibuprofen 200 MG tablet Commonly known as: ADVIL Take 600 mg by mouth daily as needed for moderate pain.   loratadine 10 MG tablet Commonly known as: CLARITIN Take 1 tablet (10 mg total) by mouth daily.   multivitamin capsule Take 1 capsule by mouth daily.   omeprazole 20 MG capsule Commonly known as: PRILOSEC Take 20 mg by mouth 2 (two) times daily before a meal.   pravastatin 40 MG tablet Commonly known as: PRAVACHOL Take 80 mg by mouth daily.   tamsulosin 0.4 MG Caps capsule Commonly known as: Flomax Take 1 capsule (0.4 mg total) by mouth daily after supper. What changed: when to take this   Vitamin C 500 MG Caps Take 500 mg by mouth daily.   Vitamin D3 50 MCG (2000 UT) Tabs Take 2,000 Units by mouth daily.      Allergies  Allergen Reactions  . Oxycodone Other (See Comments)    States that when he took oxycodone he couldn't move out of the bed.    Follow-up Information    [facility] [employer]. Schedule an appointment as soon as possible for a visit in 10 day(s).   Specialty:  Family Medicine Contact information: [address] [place] [address] [phone]        Phillips Odor, MD. Schedule an appointment as soon as possible for a visit in 4 week(s).   Specialty: Neurology Contact information: [address] [place] [address] [phone]                The results of significant diagnostics  from this hospitalization (including imaging, microbiology, ancillary and laboratory) are listed below for reference.    Significant Diagnostic Studies: CT ANGIO HEAD W OR WO CONTRAST  Result Date: 07/29/2020 CLINICAL DATA:  Blurry vision. EXAM: CT ANGIOGRAPHY HEAD AND NECK TECHNIQUE: Multidetector CT imaging of the head and neck was performed using the standard protocol during bolus administration of intravenous contrast. Multiplanar CT image reconstructions and MIPs were obtained to evaluate the vascular anatomy. Carotid stenosis measurements (when applicable) are obtained utilizing NASCET criteria, using the distal internal carotid diameter as the denominator. CONTRAST:  118mL OMNIPAQUE IOHEXOL 350 MG/ML SOLN COMPARISON:  None. FINDINGS: CTA NECK FINDINGS SKELETON: There is no bony spinal canal stenosis. No lytic or blastic lesion. OTHER NECK: Normal pharynx, larynx and major salivary glands. No cervical lymphadenopathy. Unremarkable thyroid gland. UPPER CHEST: No pneumothorax or pleural effusion. No nodules or masses. AORTIC ARCH: There is mild calcific atherosclerosis of the aortic arch. There is no aneurysm, dissection or hemodynamically significant stenosis of the visualized portion of the aorta. Conventional 3 vessel aortic branching pattern. The visualized proximal subclavian arteries are widely patent. RIGHT CAROTID SYSTEM: Normal without aneurysm, dissection or stenosis. LEFT CAROTID SYSTEM: Normal without aneurysm, dissection or stenosis. VERTEBRAL ARTERIES: Left dominant configuration. Both origins are clearly patent. There is no dissection, occlusion or flow-limiting stenosis to the skull base (V1-V3 segments). CTA HEAD FINDINGS POSTERIOR CIRCULATION: --Vertebral arteries: Normal V4 segments. --Inferior cerebellar arteries: Normal. --Basilar artery: Normal. --Superior cerebellar arteries: Normal. --Posterior cerebral arteries (PCA): Normal. ANTERIOR CIRCULATION: --Intracranial internal carotid  arteries: Atherosclerotic calcification of the internal carotid arteries at the skull base without hemodynamically significant stenosis. --Anterior cerebral arteries (ACA): Normal. Both A1 segments are present. Patent anterior communicating artery (a-comm). --Middle cerebral arteries (MCA): Normal. VENOUS SINUSES: As permitted by contrast timing, patent. ANATOMIC VARIANTS: None Review of the MIP images confirms the above findings. IMPRESSION: No emergent large vessel occlusion or high-grade stenosis of the intracranial or cervical arteries. Aortic atherosclerosis (ICD10-I70.0). Electronically Signed   By: Ulyses Jarred M.D.   On: 07/29/2020 23:10   DG Chest 2 View  Result Date: 07/29/2020 CLINICAL DATA:  Blurry vision. EXAM: CHEST - 2 VIEW COMPARISON:  January 14, 2014 FINDINGS: Mild areas of atelectasis and/or infiltrate are seen within the mid right lung and right lung base. There is no evidence of a pleural effusion or pneumothorax. The heart size and mediastinal contours are within normal limits. There is mild calcification of the aortic arch. A radiopaque fusion plate and screws are seen overlying the lower cervical spine. Mild to moderate severity dextroscoliosis of the upper thoracic spine is noted. IMPRESSION: Mild mid right lung and right basilar atelectasis and/or infiltrate. Electronically Signed   By: Virgina Norfolk M.D.   On: 07/29/2020 23:06   CT HEAD WO CONTRAST  Result Date: 07/29/2020 CLINICAL DATA:  Possible TIA versus stroke. Weakness. Difficulty discerning words. EXAM: CT HEAD WITHOUT CONTRAST TECHNIQUE: Contiguous axial images were obtained from the base of the skull through the vertex without intravenous contrast. COMPARISON:  CT head January 13, 2014 FINDINGS: Brain: There is a  new area of cytotoxic edema and loss of gray-white differentiation involving the posterior left temporal lobe and lateral occipital lobe, compatible with acute or early subacute infarct. Mild local mass effect  with sulcal effacement. No midline shift. Basal cisterns are patent. No acute hemorrhage. Similar focal hypodensity in the inferior left basal ganglia, likely a dilated perivascular space. No hydrocephalus. Vascular: No hyperdense vessel identified. Calcific atherosclerosis. Skull: Normal. Negative for fracture or focal lesion. Sinuses/Orbits: Visualized sinuses are clear. No acute orbital abnormality. Other: No mastoid effusion. IMPRESSION: Acute or early subacute infarct in the posterior left temporal and lateral occipital lobe. Sulcal effacement without midline shift. No evidence of hemorrhagic transformation. Consider MRI for further evaluation. These findings were discussed with Dr. Eulis Foster at 4:20 p.m. via telephone. Electronically Signed   By: Margaretha Sheffield MD   On: 07/29/2020 16:23   CT HEAD W CONTRAST  Result Date: 07/30/2020 CLINICAL DATA:  86 year old male presenting with weakness and abnormal speech. Noncontrast head CT yesterday with acute to subacute appearing cortical infarct in the posterior hemisphere at the left PCA/MCA watershed. Not a candidate for MRI due to spinal stimulator. EXAM: CT HEAD WITH CONTRAST TECHNIQUE: Contiguous axial images were obtained from the base of the skull through the vertex with intravenous contrast. CONTRAST:  18mL OMNIPAQUE IOHEXOL 300 MG/ML  SOLN COMPARISON:  Head CT, CTA head and neck yesterday. FINDINGS: Brain: Stable appearance of cytotoxic edema at the junction of the lateral left occipital and posterior left temporal lobes. Mild luxury perfusion related enhancement. No evidence of associated hemorrhage on this post-contrast study. No significant mass effect. Stable gray-white matter differentiation elsewhere. No suspicious intracranial enhancement. No ventriculomegaly. Vascular: Calcified atherosclerosis at the skull base. Major intracranial vascular structures are enhancing as expected, including the proximal left PCA and the major dural venous sinuses.  Skull: No acute osseous abnormality identified. Sinuses/Orbits: Visualized paranasal sinuses and mastoids are stable and well pneumatized. Other: No acute orbit or scalp soft tissue findings. IMPRESSION: 1. Stable posterior left cerebral infarct since yesterday. No hemorrhagic transformation or mass effect. 2. No new intracranial abnormality. Electronically Signed   By: Genevie Ann M.D.   On: 07/30/2020 22:03   CT ANGIO NECK W OR WO CONTRAST  Result Date: 07/29/2020 CLINICAL DATA:  Blurry vision. EXAM: CT ANGIOGRAPHY HEAD AND NECK TECHNIQUE: Multidetector CT imaging of the head and neck was performed using the standard protocol during bolus administration of intravenous contrast. Multiplanar CT image reconstructions and MIPs were obtained to evaluate the vascular anatomy. Carotid stenosis measurements (when applicable) are obtained utilizing NASCET criteria, using the distal internal carotid diameter as the denominator. CONTRAST:  116mL OMNIPAQUE IOHEXOL 350 MG/ML SOLN COMPARISON:  None. FINDINGS: CTA NECK FINDINGS SKELETON: There is no bony spinal canal stenosis. No lytic or blastic lesion. OTHER NECK: Normal pharynx, larynx and major salivary glands. No cervical lymphadenopathy. Unremarkable thyroid gland. UPPER CHEST: No pneumothorax or pleural effusion. No nodules or masses. AORTIC ARCH: There is mild calcific atherosclerosis of the aortic arch. There is no aneurysm, dissection or hemodynamically significant stenosis of the visualized portion of the aorta. Conventional 3 vessel aortic branching pattern. The visualized proximal subclavian arteries are widely patent. RIGHT CAROTID SYSTEM: Normal without aneurysm, dissection or stenosis. LEFT CAROTID SYSTEM: Normal without aneurysm, dissection or stenosis. VERTEBRAL ARTERIES: Left dominant configuration. Both origins are clearly patent. There is no dissection, occlusion or flow-limiting stenosis to the skull base (V1-V3 segments). CTA HEAD FINDINGS POSTERIOR  CIRCULATION: --Vertebral arteries: Normal V4 segments. --Inferior cerebellar arteries:  Normal. --Basilar artery: Normal. --Superior cerebellar arteries: Normal. --Posterior cerebral arteries (PCA): Normal. ANTERIOR CIRCULATION: --Intracranial internal carotid arteries: Atherosclerotic calcification of the internal carotid arteries at the skull base without hemodynamically significant stenosis. --Anterior cerebral arteries (ACA): Normal. Both A1 segments are present. Patent anterior communicating artery (a-comm). --Middle cerebral arteries (MCA): Normal. VENOUS SINUSES: As permitted by contrast timing, patent. ANATOMIC VARIANTS: None Review of the MIP images confirms the above findings. IMPRESSION: No emergent large vessel occlusion or high-grade stenosis of the intracranial or cervical arteries. Aortic atherosclerosis (ICD10-I70.0). Electronically Signed   By: Ulyses Jarred M.D.   On: 07/29/2020 23:10   US Carotid Bilateral (at [facility] and [facility] only)  Result Date: 07/30/2020 CLINICAL DATA:  Stroke symptoms, carotid atherosclerosis, previous left carotid endarterectomy EXAM: BILATERAL CAROTID DUPLEX ULTRASOUND TECHNIQUE: Pearline Cables scale imaging, color Doppler and duplex ultrasound were performed of bilateral carotid and vertebral arteries in the neck. COMPARISON:  07/29/2020 FINDINGS: Criteria: Quantification of carotid stenosis is based on velocity parameters that correlate the residual internal carotid diameter with NASCET-based stenosis levels, using the diameter of the distal internal carotid lumen as the denominator for stenosis measurement. The following velocity measurements were obtained: RIGHT ICA: 233/47 cm/sec CCA: 19/41 cm/sec SYSTOLIC ICA/CCA RATIO:  4.6 ECA: 87 cm/sec LEFT ICA: 122/41 cm/sec CCA: 74/08 cm/sec SYSTOLIC ICA/CCA RATIO:  2.5 ECA: 60 cm/sec RIGHT CAROTID ARTERY: Heterogeneous irregular calcified right carotid bifurcation atherosclerosis. Proximal ICA velocity elevation measures up to 233/47  centimeters/second with spectral broadening and mild turbulent flow. Right ICA stenosis estimated at greater than 70% by ultrasound criteria. RIGHT VERTEBRAL ARTERY:  Normal antegrade flow LEFT CAROTID ARTERY: Similar scattered minor echogenic plaque formation. No hemodynamically significant left ICA stenosis, velocity elevation, or turbulent flow. LEFT VERTEBRAL ARTERY:  Normal antegrade flow IMPRESSION: Right ICA stenosis estimated at greater than 70% by ultrasound criteria. Left ICA narrowing less than 50% Normal antegrade vertebral flow bilaterally Electronically Signed   By: Jerilynn Mages.  Shick M.D.   On: 07/30/2020 10:33   ECHOCARDIOGRAM COMPLETE  Result Date: 07/30/2020    ECHOCARDIOGRAM REPORT   Patient Name:   NEFTALI ABAIR Date of Exam: 07/30/2020 Medical Rec #:  [id number]         Height:       69.0 in Accession #:    [id number]        Weight:       165.0 lb Date of Birth:  Jun 12, 1934         BSA:          1.904 m Patient Age:    89 years          BP:           158/101 mmHg Patient Gender: M                 HR:           71 bpm. Exam Location:  [facility] Procedure: 2D Echo, Cardiac Doppler and Color Doppler Indications:    Stroke 434.91 / I163.9  History:        Patient has no prior history of Echocardiogram examinations.                 Risk Factors:Hypertension and Dyslipidemia.  Sonographer:    Alvino Chapel RCS Referring Phys: [place]  1. Left ventricular ejection fraction, by estimation, is 55 to 60%. The left ventricle has normal function. The left ventricle has no regional wall motion abnormalities. Left ventricular diastolic  parameters are indeterminate.  2. Right ventricular systolic function is normal. The right ventricular size is normal. Tricuspid regurgitation signal is inadequate for assessing PA pressure.  3. The mitral valve is grossly normal. Mild mitral valve regurgitation.  4. The aortic valve is tricuspid. There is moderate calcification of the aortic valve.  Aortic valve regurgitation is not visualized. Mild to moderate aortic valve sclerosis/calcification is present, without any evidence of aortic stenosis.  5. The inferior vena cava is normal in size with greater than 50% respiratory variability, suggesting right atrial pressure of 3 mmHg. FINDINGS  Left Ventricle: Left ventricular ejection fraction, by estimation, is 55 to 60%. The left ventricle has normal function. The left ventricle has no regional wall motion abnormalities. The left ventricular internal cavity size was normal in size. There is  no left ventricular hypertrophy. Left ventricular diastolic parameters are indeterminate. Right Ventricle: The right ventricular size is normal. No increase in right ventricular wall thickness. Right ventricular systolic function is normal. Tricuspid regurgitation signal is inadequate for assessing PA pressure. Left Atrium: Left atrial size was normal in size. Right Atrium: Right atrial size was normal in size. Pericardium: There is no evidence of pericardial effusion. Presence of pericardial fat pad. Mitral Valve: The mitral valve is grossly normal. Mild mitral valve regurgitation. Tricuspid Valve: The tricuspid valve is grossly normal. Tricuspid valve regurgitation is trivial. Aortic Valve: The aortic valve is tricuspid. There is moderate calcification of the aortic valve. There is moderate aortic valve annular calcification. Aortic valve regurgitation is not visualized. Mild to moderate aortic valve sclerosis/calcification is  present, without any evidence of aortic stenosis. Pulmonic Valve: The pulmonic valve was grossly normal. Pulmonic valve regurgitation is trivial. Aorta: The aortic root is normal in size and structure. Venous: The inferior vena cava is normal in size with greater than 50% respiratory variability, suggesting right atrial pressure of 3 mmHg. IAS/Shunts: No atrial level shunt detected by color flow Doppler.  LEFT VENTRICLE PLAX 2D LVIDd:         4.82  cm  Diastology LVIDs:         3.58 cm  LV e' medial:    3.70 cm/s LV PW:         0.95 cm  LV E/e' medial:  13.5 LV IVS:        1.07 cm  LV e' lateral:   5.44 cm/s LVOT diam:     1.90 cm  LV E/e' lateral: 9.2 LV SV:         55 LV SV Index:   29 LVOT Area:     2.84 cm  RIGHT VENTRICLE RV S prime:     17.50 cm/s TAPSE (M-mode): 2.5 cm LEFT ATRIUM             Index       RIGHT ATRIUM           Index LA diam:        3.00 cm 1.58 cm/m  RA Area:     16.10 cm LA Vol (A2C):   37.1 ml 19.49 ml/m RA Volume:   40.50 ml  21.27 ml/m LA Vol (A4C):   27.2 ml 14.29 ml/m LA Biplane Vol: 31.6 ml 16.60 ml/m  AORTIC VALVE LVOT Vmax:   104.00 cm/s LVOT Vmean:  64.400 cm/s LVOT VTI:    0.194 m  AORTA Ao Root diam: 3.50 cm MITRAL VALVE MV Area (PHT): 1.78 cm     SHUNTS MV Decel Time: 425 msec  Systemic VTI:  0.19 m MV E velocity: 50.00 cm/s   Systemic Diam: 1.90 cm MV A velocity: 114.00 cm/s MV E/A ratio:  0.44 Rozann Lesches MD Electronically signed by Rozann Lesches MD Signature Date/Time: 07/30/2020/10:33:22 AM    Final     Microbiology: Recent Results (from the past 240 hour(s))  Respiratory Panel by RT PCR (Flu A&B, Covid) - Nasopharyngeal Swab     Status: None   Collection Time: 07/29/20  4:26 PM   Specimen: Nasopharyngeal Swab  Result Value Ref Range Status   SARS Coronavirus 2 by RT PCR NEGATIVE NEGATIVE Final    Comment: (NOTE) SARS-CoV-2 target nucleic acids are NOT DETECTED.  The SARS-CoV-2 RNA is generally detectable in upper respiratoy specimens during the acute phase of infection. The lowest concentration of SARS-CoV-2 viral copies this assay can detect is 131 copies/mL. A negative result does not preclude SARS-Cov-2 infection and should not be used as the sole basis for treatment or other patient management decisions. A negative result may occur with  improper specimen collection/handling, submission of specimen other than nasopharyngeal swab, presence of viral mutation(s) within the areas  targeted by this assay, and inadequate number of viral copies (<131 copies/mL). A negative result must be combined with clinical observations, patient history, and epidemiological information. The expected result is Negative.  Fact Sheet for Patients:  PinkCheek.be  Fact Sheet for Healthcare Providers:  GravelBags.it  This test is no t yet approved or cleared by the [place] FDA and  has been authorized for detection and/or diagnosis of SARS-CoV-2 by FDA under an Emergency Use Authorization (EUA). This EUA will remain  in effect (meaning this test can be used) for the duration of the COVID-19 declaration under Section 564(b)(1) of the Act, 21 U.S.C. section 360bbb-3(b)(1), unless the authorization is terminated or revoked sooner.     Influenza A by PCR NEGATIVE NEGATIVE Final   Influenza B by PCR NEGATIVE NEGATIVE Final    Comment: (NOTE) The Xpert Xpress SARS-CoV-2/FLU/RSV assay is intended as an aid in  the diagnosis of influenza from Nasopharyngeal swab specimens and  should not be used as a sole basis for treatment. Nasal washings and  aspirates are unacceptable for Xpert Xpress SARS-CoV-2/FLU/RSV  testing.  Fact Sheet for Patients: PinkCheek.be  Fact Sheet for Healthcare Providers: GravelBags.it  This test is not yet approved or cleared by the [place] FDA and  has been authorized for detection and/or diagnosis of SARS-CoV-2 by  FDA under an Emergency Use Authorization (EUA). This EUA will remain  in effect (meaning this test can be used) for the duration of the  Covid-19 declaration under Section 564(b)(1) of the Act, 21  U.S.C. section 360bbb-3(b)(1), unless the authorization is  terminated or revoked. Performed at [facility], [address], [place], [place] [address]      Labs: Basic Metabolic Panel: Recent Labs  Lab 07/29/20 1506  NA  135  K 3.9  CL 96*  CO2 27  GLUCOSE 94  BUN 16  CREATININE 1.03  CALCIUM 9.0   Liver Function Tests: Recent Labs  Lab 07/29/20 1506  AST 36  ALT 15  ALKPHOS 69  BILITOT 1.0  PROT 7.2  ALBUMIN 3.3*   CBC: Recent Labs  Lab 07/29/20 1506  WBC 12.2*  NEUTROABS 7.6  HGB 11.1*  HCT 34.9*  MCV 89.9  PLT 243   Signed:  Barton Dubois MD.  [facility] 07/31/2020, 8:53 AM

## 2020-07-31 NOTE — Discharge Instructions (Signed)

## 2020-08-07 ENCOUNTER — Inpatient Hospital Stay (HOSPITAL_COMMUNITY): Payer: No Typology Code available for payment source

## 2020-08-07 ENCOUNTER — Inpatient Hospital Stay (HOSPITAL_COMMUNITY)
Admission: EM | Admit: 2020-08-07 | Discharge: 2020-08-09 | DRG: 195 | Disposition: A | Discharge status: Home / Self Care | Payer: No Typology Code available for payment source | Attending: Internal Medicine | Admitting: Internal Medicine

## 2020-08-07 ENCOUNTER — Encounter (HOSPITAL_COMMUNITY): Payer: Self-pay | Admitting: Emergency Medicine

## 2020-08-07 ENCOUNTER — Emergency Department (HOSPITAL_COMMUNITY): Payer: No Typology Code available for payment source

## 2020-08-07 ENCOUNTER — Other Ambulatory Visit: Payer: Self-pay

## 2020-08-07 DIAGNOSIS — E7800 Pure hypercholesterolemia, unspecified: Secondary | ICD-10-CM | POA: Diagnosis present

## 2020-08-07 DIAGNOSIS — Z87891 Personal history of nicotine dependence: Secondary | ICD-10-CM

## 2020-08-07 DIAGNOSIS — R3589 Other polyuria: Secondary | ICD-10-CM | POA: Diagnosis present

## 2020-08-07 DIAGNOSIS — J181 Lobar pneumonia, unspecified organism: Secondary | ICD-10-CM | POA: Diagnosis not present

## 2020-08-07 DIAGNOSIS — Z79899 Other long term (current) drug therapy: Secondary | ICD-10-CM

## 2020-08-07 DIAGNOSIS — Z9842 Cataract extraction status, left eye: Secondary | ICD-10-CM

## 2020-08-07 DIAGNOSIS — M1990 Unspecified osteoarthritis, unspecified site: Secondary | ICD-10-CM | POA: Diagnosis present

## 2020-08-07 DIAGNOSIS — J189 Pneumonia, unspecified organism: Secondary | ICD-10-CM

## 2020-08-07 DIAGNOSIS — Z8673 Personal history of transient ischemic attack (TIA), and cerebral infarction without residual deficits: Secondary | ICD-10-CM

## 2020-08-07 DIAGNOSIS — Z7902 Long term (current) use of antithrombotics/antiplatelets: Secondary | ICD-10-CM

## 2020-08-07 DIAGNOSIS — Z961 Presence of intraocular lens: Secondary | ICD-10-CM | POA: Diagnosis present

## 2020-08-07 DIAGNOSIS — R7302 Impaired glucose tolerance (oral): Secondary | ICD-10-CM | POA: Diagnosis present

## 2020-08-07 DIAGNOSIS — I2510 Atherosclerotic heart disease of native coronary artery without angina pectoris: Secondary | ICD-10-CM | POA: Diagnosis not present

## 2020-08-07 DIAGNOSIS — Z9841 Cataract extraction status, right eye: Secondary | ICD-10-CM | POA: Diagnosis not present

## 2020-08-07 DIAGNOSIS — E785 Hyperlipidemia, unspecified: Secondary | ICD-10-CM | POA: Diagnosis present

## 2020-08-07 DIAGNOSIS — Z20822 Contact with and (suspected) exposure to covid-19: Secondary | ICD-10-CM | POA: Diagnosis present

## 2020-08-07 DIAGNOSIS — Z885 Allergy status to narcotic agent status: Secondary | ICD-10-CM | POA: Diagnosis not present

## 2020-08-07 DIAGNOSIS — I10 Essential (primary) hypertension: Secondary | ICD-10-CM | POA: Diagnosis present

## 2020-08-07 DIAGNOSIS — I700 Atherosclerosis of aorta: Secondary | ICD-10-CM | POA: Diagnosis not present

## 2020-08-07 DIAGNOSIS — N400 Enlarged prostate without lower urinary tract symptoms: Secondary | ICD-10-CM | POA: Diagnosis present

## 2020-08-07 DIAGNOSIS — K219 Gastro-esophageal reflux disease without esophagitis: Secondary | ICD-10-CM | POA: Diagnosis present

## 2020-08-07 DIAGNOSIS — R55 Syncope and collapse: Secondary | ICD-10-CM | POA: Diagnosis not present

## 2020-08-07 DIAGNOSIS — Z8701 Personal history of pneumonia (recurrent): Secondary | ICD-10-CM | POA: Diagnosis not present

## 2020-08-07 LAB — URINALYSIS, ROUTINE W REFLEX MICROSCOPIC
Bilirubin Urine: NEGATIVE
Glucose, UA: NEGATIVE mg/dL
Hgb urine dipstick: NEGATIVE
Ketones, ur: NEGATIVE mg/dL
Leukocytes,Ua: NEGATIVE
Nitrite: NEGATIVE
Protein, ur: NEGATIVE mg/dL
Specific Gravity, Urine: 1.01 (ref 1.005–1.030)
pH: 6 (ref 5.0–8.0)

## 2020-08-07 LAB — COMPREHENSIVE METABOLIC PANEL
ALT: 14 U/L (ref 0–44)
AST: 20 U/L (ref 15–41)
Albumin: 3 g/dL — ABNORMAL LOW (ref 3.5–5.0)
Alkaline Phosphatase: 67 U/L (ref 38–126)
Anion gap: 9 (ref 5–15)
BUN: 20 mg/dL (ref 8–23)
CO2: 25 mmol/L (ref 22–32)
Calcium: 8.7 mg/dL — ABNORMAL LOW (ref 8.9–10.3)
Chloride: 104 mmol/L (ref 98–111)
Creatinine, Ser: 1.14 mg/dL (ref 0.61–1.24)
GFR, Estimated: 59 mL/min — ABNORMAL LOW (ref >60)
Glucose, Bld: 110 mg/dL — ABNORMAL HIGH (ref 70–99)
Potassium: 4.2 mmol/L (ref 3.5–5.1)
Sodium: 138 mmol/L (ref 135–145)
Total Bilirubin: 0.7 mg/dL (ref 0.3–1.2)
Total Protein: 6.9 g/dL (ref 6.5–8.1)

## 2020-08-07 LAB — TROPONIN I (HIGH SENSITIVITY)
Troponin I (High Sensitivity): 21 ng/L — ABNORMAL HIGH (ref <18)
Troponin I (High Sensitivity): 21 ng/L — ABNORMAL HIGH (ref <18)

## 2020-08-07 LAB — CBC WITH DIFFERENTIAL/PLATELET
Abs Immature Granulocytes: 0.19 10*3/uL — ABNORMAL HIGH (ref 0.00–0.07)
Basophils Absolute: 0.1 10*3/uL (ref 0.0–0.1)
Basophils Relative: 0 %
Eosinophils Absolute: 0.1 10*3/uL (ref 0.0–0.5)
Eosinophils Relative: 1 %
HCT: 34.5 % — ABNORMAL LOW (ref 39.0–52.0)
Hemoglobin: 11.1 g/dL — ABNORMAL LOW (ref 13.0–17.0)
Immature Granulocytes: 1 %
Lymphocytes Relative: 9 %
Lymphs Abs: 2.1 10*3/uL (ref 0.7–4.0)
MCH: 28.6 pg (ref 26.0–34.0)
MCHC: 32.2 g/dL (ref 30.0–36.0)
MCV: 88.9 fL (ref 80.0–100.0)
Monocytes Absolute: 1.3 10*3/uL — ABNORMAL HIGH (ref 0.1–1.0)
Monocytes Relative: 5 %
Neutro Abs: 20.7 10*3/uL — ABNORMAL HIGH (ref 1.7–7.7)
Neutrophils Relative %: 84 %
Platelets: 314 10*3/uL (ref 150–400)
RBC: 3.88 MIL/uL — ABNORMAL LOW (ref 4.22–5.81)
RDW: 14.5 % (ref 11.5–15.5)
WBC: 24.5 10*3/uL — ABNORMAL HIGH (ref 4.0–10.5)
nRBC: 0 % (ref 0.0–0.2)

## 2020-08-07 LAB — MRSA PCR SCREENING: MRSA by PCR: NEGATIVE

## 2020-08-07 LAB — RESPIRATORY PANEL BY RT PCR (FLU A&B, COVID)
Influenza A by PCR: NEGATIVE
Influenza B by PCR: NEGATIVE
SARS Coronavirus 2 by RT PCR: NEGATIVE

## 2020-08-07 LAB — LACTIC ACID, PLASMA: Lactic Acid, Venous: 1.3 mmol/L (ref 0.5–1.9)

## 2020-08-07 LAB — PROCALCITONIN: Procalcitonin: <0.1 ng/mL

## 2020-08-07 MED ORDER — ACETAMINOPHEN 650 MG RE SUPP: 650.0000 mg | Freq: Four times a day (QID) | RECTAL | Status: DC | PRN

## 2020-08-07 MED ORDER — SODIUM CHLORIDE 0.9 % IV SOLN
2.0000 g | Freq: Once | INTRAVENOUS | Status: AC
  Administered 2020-08-07: 2 g via INTRAVENOUS
  Filled 2020-08-07: qty 2

## 2020-08-07 MED ORDER — ONDANSETRON HCL 4 MG/2ML IJ SOLN: 4.0000 mg | Freq: Four times a day (QID) | INTRAMUSCULAR | Status: DC | PRN

## 2020-08-07 MED ORDER — PANTOPRAZOLE SODIUM 40 MG PO TBEC
40.0000 mg | DELAYED_RELEASE_TABLET | Freq: Every day | ORAL | Status: DC
  Administered 2020-08-08 – 2020-08-09 (×2): 40 mg via ORAL
  Filled 2020-08-07 (×2): qty 1

## 2020-08-07 MED ORDER — AZITHROMYCIN 250 MG PO TABS
500.0000 mg | ORAL_TABLET | Freq: Every day | ORAL | Status: DC
  Administered 2020-08-08 – 2020-08-09 (×2): 500 mg via ORAL
  Filled 2020-08-07 (×2): qty 2

## 2020-08-07 MED ORDER — SODIUM CHLORIDE 0.9 % IV SOLN: 1.0000 g | Freq: Once | INTRAVENOUS | Status: DC

## 2020-08-07 MED ORDER — SODIUM CHLORIDE 0.9 % IV SOLN
2.0000 g | Freq: Two times a day (BID) | INTRAVENOUS | Status: DC
  Administered 2020-08-08 – 2020-08-09 (×3): 2 g via INTRAVENOUS
  Filled 2020-08-07 (×3): qty 2

## 2020-08-07 MED ORDER — GABAPENTIN 400 MG PO CAPS
400.0000 mg | ORAL_CAPSULE | Freq: Three times a day (TID) | ORAL | Status: DC
  Administered 2020-08-07 – 2020-08-09 (×5): 400 mg via ORAL
  Filled 2020-08-07 (×6): qty 1

## 2020-08-07 MED ORDER — VANCOMYCIN HCL 1750 MG/350ML IV SOLN
1750.0000 mg | Freq: Once | INTRAVENOUS | Status: AC
  Administered 2020-08-07: 1750 mg via INTRAVENOUS
  Filled 2020-08-07: qty 350

## 2020-08-07 MED ORDER — ASPIRIN 325 MG PO TABS
325.0000 mg | ORAL_TABLET | Freq: Every day | ORAL | Status: DC
  Administered 2020-08-08 – 2020-08-09 (×2): 325 mg via ORAL
  Filled 2020-08-07 (×2): qty 1

## 2020-08-07 MED ORDER — TAMSULOSIN HCL 0.4 MG PO CAPS
0.4000 mg | ORAL_CAPSULE | Freq: Every day | ORAL | Status: DC
  Administered 2020-08-08: 0.4 mg via ORAL
  Filled 2020-08-07: qty 1

## 2020-08-07 MED ORDER — ENOXAPARIN SODIUM 40 MG/0.4ML ~~LOC~~ SOLN
40.0000 mg | SUBCUTANEOUS | Status: DC
  Administered 2020-08-07 – 2020-08-08 (×2): 40 mg via SUBCUTANEOUS
  Filled 2020-08-07 (×2): qty 0.4

## 2020-08-07 MED ORDER — IOHEXOL 300 MG/ML  SOLN
75.0000 mL | Freq: Once | INTRAMUSCULAR | Status: AC | PRN
  Administered 2020-08-07: 75 mL via INTRAVENOUS

## 2020-08-07 MED ORDER — CLOPIDOGREL BISULFATE 75 MG PO TABS
75.0000 mg | ORAL_TABLET | Freq: Every day | ORAL | Status: DC
  Administered 2020-08-08 – 2020-08-09 (×2): 75 mg via ORAL
  Filled 2020-08-07 (×2): qty 1

## 2020-08-07 MED ORDER — ADULT MULTIVITAMIN W/MINERALS CH
1.0000 | ORAL_TABLET | Freq: Every day | ORAL | Status: DC
  Filled 2020-08-07 (×4): qty 1

## 2020-08-07 MED ORDER — LORATADINE 10 MG PO TABS
10.0000 mg | ORAL_TABLET | Freq: Every day | ORAL | Status: DC
  Filled 2020-08-07: qty 1

## 2020-08-07 MED ORDER — PRAVASTATIN SODIUM 40 MG PO TABS
80.0000 mg | ORAL_TABLET | Freq: Every day | ORAL | Status: DC
  Administered 2020-08-08 – 2020-08-09 (×2): 80 mg via ORAL
  Filled 2020-08-07: qty 2
  Filled 2020-08-07 (×3): qty 1

## 2020-08-07 MED ORDER — ONDANSETRON HCL 4 MG PO TABS
4.0000 mg | ORAL_TABLET | Freq: Four times a day (QID) | ORAL | Status: DC | PRN
  Administered 2020-08-07: 4 mg via ORAL
  Filled 2020-08-07: qty 1

## 2020-08-07 MED ORDER — SODIUM CHLORIDE 0.9 % IV BOLUS
1000.0000 mL | Freq: Once | INTRAVENOUS | Status: AC
  Administered 2020-08-07: 1000 mL via INTRAVENOUS

## 2020-08-07 MED ORDER — SODIUM CHLORIDE 0.9 % IV SOLN: INTRAVENOUS | Status: DC

## 2020-08-07 MED ORDER — VITAMIN D 25 MCG (1000 UNIT) PO TABS
2000.0000 [IU] | ORAL_TABLET | Freq: Every day | ORAL | Status: DC
  Administered 2020-08-08 – 2020-08-09 (×2): 2000 [IU] via ORAL
  Filled 2020-08-07 (×5): qty 2

## 2020-08-07 MED ORDER — ASCORBIC ACID 500 MG PO TABS
500.0000 mg | ORAL_TABLET | Freq: Every day | ORAL | Status: DC
  Administered 2020-08-08 – 2020-08-09 (×2): 500 mg via ORAL
  Filled 2020-08-07 (×5): qty 1

## 2020-08-07 MED ORDER — DOCUSATE SODIUM 100 MG PO CAPS
100.0000 mg | ORAL_CAPSULE | Freq: Two times a day (BID) | ORAL | Status: DC
  Administered 2020-08-07 – 2020-08-09 (×4): 100 mg via ORAL
  Filled 2020-08-07 (×4): qty 1

## 2020-08-07 MED ORDER — ACETAMINOPHEN 325 MG PO TABS
650.0000 mg | ORAL_TABLET | Freq: Four times a day (QID) | ORAL | Status: DC | PRN
  Administered 2020-08-07: 650 mg via ORAL
  Filled 2020-08-07: qty 2

## 2020-08-07 NOTE — ED Triage Notes (Signed)
Pt brought in by [facility]. Pt states he had a near syncopal episode this morning when getting out of the bed. States his blood pressure has been dropping for the last 63months.

## 2020-08-07 NOTE — ED Notes (Signed)
Antibiotics delayed due to lab.

## 2020-08-07 NOTE — Progress Notes (Addendum)
Pharmacy Antibiotic Note  Kevin Matthews is a 86 y.o. male 3dmitted on 08/07/2020 with pneumonia.  Pharmacy has been consulted for vancomycin dosing.  Plan:  Start cefepime 2g IV q12h Give vancomycin 1.75g  IV x1 dose, then vancomycin  1g IV q24h Goal vancomycin trough range:  15-20 mcg/mL Pharmacy will continue to monitor renal function, vancomycin troughs as clinically appropriate,  cultures and patient progress  Height: 5\' 9"  (175.3 cm) Weight: 74.8 kg (165 lb) IBW/kg (Calculated) : 70.7  Temp (24hrs), Avg:98.3 F (36.8 C), Min:98.3 F (36.8 C), Max:98.3 F (36.8 C)  Recent Labs  Lab 08/07/20 1234  WBC 24.5*  CREATININE 1.14  LATICACIDVEN 1.3    Estimated Creatinine Clearance: 48.2 mL/min (by C-G formula based on SCr of 1.14 mg/dL).    Allergies  Allergen Reactions   Oxycodone Other (See Comments)    States that when he took oxycodone he couldn't move out of the bed.     Antimicrobials this admission: vancomycin 10/9 >>   cefepime 10/9>>        Microbiology results: 10/9 [facility] x2:  10/9 Resp PCR: SARS CoV-2 negative; Flu A/B negative 10/9 MRSA PCR:    Thank you for allowing pharmacy to be a part of this patients care.  Despina Pole 08/07/2020 2:38 PM

## 2020-08-07 NOTE — H&P (Signed)
History and Physical  Kevin Matthews LZJ:[id number] DOB: [birth year] DOA: 08/07/2020   PCP: [facility]   Patient coming from: Home  Chief Complaint: generalized weakness; fever  HPI:  Kevin Matthews is a 86 y.o. male with medical history of hypertension, hyperlipidemia, GERD, recent stroke presenting with generalized weakness and fever.  The patient was recently discharged from the hospital after a stay from 2020/08/01 to 07/31/20 when he was treated for an acute ischemic stroke.  CT of the brain on 2020/08/01 showed an acute versus early subacute infarct in the posterior left temporal and occipital lobes.  The patient was discharged home with aspirin 325 mg and Plavix 75 mg daily.  The patient states that he was gradually feeling better until the morning of 08/07/2020 when he experienced fevers and chills.  His wife took his temperature, and it was 100.7 F.  He had a feeling of generalized weakness to the point where he needed assistance to get off the bed and into the chair in his living room.  The patient had a near syncopal episode when he was getting back from the bathroom to his bedroom which is when he began having this generalized weakness feeling with his fevers and chills.  He had denied any headache, neck pain, chest pain, shortness breath, coughing, hemoptysis, nausea, vomiting, diarrhea, abdominal pain, dysuria, hematuria.  However he has had polyuria.  He denies any new medications besides taking higher dose aspirin.  Notably, the patient states that his tamsulosin has been increased from once daily to twice daily 2 weeks ago from his [place].  In addition, the patient notes that he has had some abnormality on the right side of his lung [facility].  Apparently, they told him that he eventually need some type of biopsy. In the emergency department, the patient was afebrile hemodynamically stable with oxygen saturation 96% room air.  BMP  was essentially unremarkable with serum creatinine at baseline.  LFTs were unremarkable.  WBC 24.5, hemoglobin 1.1, platelets 240,000.  Lactic acid 1.3.  Troponins were unremarkable 21>>> 21.  Chest x-ray showed increasing right upper lobe and right middle lobe opacity.  UA was negative for pyuria.  EKG showed sinus rhythm with nonspecific ST changes.  Assessment/Plan: Lobar pneumonia -Start cefepime and azithromycin -Check procalcitonin -Urine Legionella antigen -CT chest as this opacity was present during his previous admission on 01-Aug-2020, but is more pronounced now -Follow blood cultures  Near syncope/generalized weakness -Likely secondary to infectious process -Check orthostatics -Continue IV fluids -Monitor on telemetry  Recent acute ischemic stroke -Cannot have MRI secondary to spinal stimulator -Continue aspirin and Plavix -Continue statin  Impaired glucose tolerance -Hemoglobin A1c 5.8  Hyperlipidemia -Continue statin  Essential hypertension -Plan to allow permissive hypertension at this point given his orthostasis type symptoms  BPH -Continue tamsulosin       Past Medical History:  Diagnosis Date  . Arthritis   . BPH (benign prostatic hyperplasia)   . GERD (gastroesophageal reflux disease)   . Heart murmur    dr Feliz Beam  . Hypercholesteremia    Past Surgical History:  Procedure Laterality Date  . BACK SURGERY     x5 cervical plate;  Marland Kitchen CAROTID ENDARTERECTOMY Left   . CATARACT EXTRACTION W/PHACO Right 07/27/2015   Procedure: CATARACT EXTRACTION PHACO AND INTRAOCULAR LENS PLACEMENT (IOC);  Surgeon: Rutherford Guys, MD;  Location: [facility];  Service: Ophthalmology;  Laterality:  Right;  CDE:9.43  . CATARACT EXTRACTION W/PHACO Left 08/10/2015   Procedure: CATARACT EXTRACTION PHACO AND INTRAOCULAR LENS PLACEMENT (IOC);  Surgeon: Rutherford Guys, MD;  Location: [facility];  Service: Ophthalmology;  Laterality: Left;  CDE:8.27  . LUMBAR LAMINECTOMY/DECOMPRESSION  MICRODISCECTOMY N/A 01/07/2014   Procedure: Lumbar three-four, Lumbar four-five redo laminectomy and foraminotomy;  Surgeon: Ophelia Charter, MD;  Location: [place] [facility];  Service: Neurosurgery;  Laterality: N/A;  Lumbar three-four, Lumbar four-five redo laminectomy and foraminotomy  . TONSILLECTOMY    . VASCULAR SURGERY     Social History:  reports that he quit smoking about 41 years ago. His smoking use included cigarettes. He has a 64.00 pack-year smoking history. He has never used smokeless tobacco. He reports current alcohol use of about 8.0 standard drinks of alcohol per week. He reports that he does not use drugs.   History reviewed. No pertinent family history.   Allergies  Allergen Reactions  . Oxycodone Other (See Comments)    States that when he took oxycodone he couldn't move out of the bed.     Prior to Admission medications   Medication Sig Start Date End Date Taking? Authorizing Provider  acetaminophen (TYLENOL) 500 MG tablet Take 1,000 mg by mouth every 6 (six) hours as needed.   Yes [provider]  Ascorbic Acid (VITAMIN C) 500 MG CAPS Take 500 mg by mouth daily.   Yes [provider]  aspirin 325 MG tablet Take 1 tablet (325 mg total) by mouth daily. 07/31/20  Yes Barton Dubois, MD  Cholecalciferol (VITAMIN D3) 2000 UNITS TABS Take 2,000 Units by mouth daily.   Yes [provider]  clopidogrel (PLAVIX) 75 MG tablet Take 1 tablet (75 mg total) by mouth daily with breakfast. 07/31/20  Yes Barton Dubois, MD  docusate sodium (COLACE) 100 MG capsule Take 100 mg by mouth 2 (two) times daily.   Yes [provider]  gabapentin (NEURONTIN) 400 MG capsule Take 400 mg by mouth 3 (three) times daily.    Yes [provider]  hydrALAZINE (APRESOLINE) 10 MG tablet Take 10 mg by mouth See admin instructions. Take 1/2 tablet by mouth if BP 120 plus standing, or if BP 180 plus sitting. Take 1 tablet by mouth daily as directed.   Yes [provider]  ibuprofen (ADVIL,MOTRIN) 200 MG tablet Take 600 mg by mouth daily as needed for moderate pain.    Yes [provider]  loratadine (CLARITIN) 10 MG tablet Take 1 tablet (10 mg total) by mouth daily. 07/31/20  Yes Barton Dubois, MD  omeprazole (PRILOSEC) 20 MG capsule Take 20 mg by mouth 2 (two) times daily before a meal.    Yes [provider]  pravastatin (PRAVACHOL) 40 MG tablet Take 80 mg by mouth daily.   Yes [provider]  tamsulosin (FLOMAX) 0.4 MG CAPS capsule Take 1 capsule (0.4 mg total) by mouth daily after supper. Patient taking differently: Take 0.4 mg by mouth 2 (two) times daily.  01/15/14  Yes Nita Sells, MD  Multiple Vitamin (MULTIVITAMIN) capsule Take 1 capsule by mouth daily. Patient not taking: Reported on 08/07/2020 07/31/20   Barton Dubois, MD    Review of Systems:  Constitutional:  No weight loss, night sweats, Head&Eyes: No headache.  No vision loss.  No eye pain or scotoma ENT:  No Difficulty swallowing,Tooth/dental problems,Sore throat,  No ear ache, post nasal drip,  Cardio-vascular:  No chest pain, Orthopnea, PND, swelling in lower extremities,  dizziness,  palpitations  GI:  No  abdominal pain, nausea, vomiting, diarrhea, loss of appetite, hematochezia, melena, heartburn, indigestion, Resp:  No shortness of breath with exertion or at rest. No cough. No coughing up of blood .No wheezing.No chest wall deformity  Skin:  no rash or lesions.  GU:  no dysuria, change in color of urine, no urgency or frequency. No flank pain.  Musculoskeletal:  No joint pain or swelling. No decreased range of motion. No back pain.  Psych:  No change in mood or affect. No depression or anxiety. Neurologic: No headache, no dysesthesia, no focal weakness, no vision loss. No syncope  Physical Exam: Vitals:   08/07/20 1054 08/07/20 1055 08/07/20 1300 08/07/20 1330  BP: 128/73  (!) 114/56 134/79  Pulse: (!) 52  64 64  Resp: 12   16 16   Temp: 98.3 F (36.8 C)     TempSrc: Oral     SpO2: 94%  97% 95%  Weight:  74.8 kg    Height:  5\' 9"  (1.753 m)     General:  A&O x 3, NAD, nontoxic, pleasant/cooperative Head/Eye: No conjunctival hemorrhage, no icterus, [place]/AT, No nystagmus ENT:  No icterus,  No thrush, good dentition, no pharyngeal exudate Neck:  No masses, no lymphadenpathy, no bruits CV:  RRR, no rub, no gallop, no S3 Lung:  Bilateral rales. Diminished BS on the right Abdomen: soft/NT, +BS, nondistended, no peritoneal signs Ext: No cyanosis, No rashes, No petechiae, No lymphangitis, No edema Neuro: CNII-XII intact, strength 4/5 in bilateral upper and lower extremities, no dysmetria  Labs on Admission:  Basic Metabolic Panel: Recent Labs  Lab 08/07/20 1234  NA 138  K 4.2  CL 104  CO2 25  GLUCOSE 110*  BUN 20  CREATININE 1.14  CALCIUM 8.7*   Liver Function Tests: Recent Labs  Lab 08/07/20 1234  AST 20  ALT 14  ALKPHOS 67  BILITOT 0.7  PROT 6.9  ALBUMIN 3.0*   No results for input(s): LIPASE, AMYLASE in the last 168 hours. No results for input(s): AMMONIA in the last 168 hours. CBC: Recent Labs  Lab 08/07/20 1234  WBC 24.5*  NEUTROABS 20.7*  HGB 11.1*  HCT 34.5*  MCV 88.9  PLT 314   Coagulation Profile: No results for input(s): INR, PROTIME in the last 168 hours. Cardiac Enzymes: No results for input(s): CKTOTAL, CKMB, CKMBINDEX, TROPONINI in the last 168 hours. BNP: Invalid input(s): POCBNP CBG: No results for input(s): GLUCAP in the last 168 hours. Urine analysis:    Component Value Date/Time   COLORURINE STRAW (A) 08/07/2020 1258   APPEARANCEUR CLEAR 08/07/2020 1258   LABSPEC 1.010 08/07/2020 1258   PHURINE 6.0 08/07/2020 1258   GLUCOSEU NEGATIVE 08/07/2020 1258   HGBUR NEGATIVE 08/07/2020 [place] 08/07/2020 1258   KETONESUR NEGATIVE 08/07/2020 1258   PROTEINUR NEGATIVE 08/07/2020 1258   UROBILINOGEN 0.2 01/22/2014 0930   NITRITE NEGATIVE  08/07/2020 [place] 08/07/2020 1258   Sepsis Labs: @LABRCNTIP (procalcitonin:4,lacticidven:4) ) Recent Results (from the past 240 hour(s))  Respiratory Panel by RT PCR (Flu A&B, Covid) - Nasopharyngeal Swab     Status: None   Collection Time: 07/29/20  4:26 PM   Specimen: Nasopharyngeal Swab  Result Value Ref Range Status   SARS Coronavirus 2 by RT PCR NEGATIVE NEGATIVE Final    Comment: (NOTE) SARS-CoV-2 target nucleic acids are NOT DETECTED.  The SARS-CoV-2 RNA is generally detectable in upper respiratoy specimens during the acute phase of infection. The  lowest concentration of SARS-CoV-2 viral copies this assay can detect is 131 copies/mL. A negative result does not preclude SARS-Cov-2 infection and should not be used as the sole basis for treatment or other patient management decisions. A negative result may occur with  improper specimen collection/handling, submission of specimen other than nasopharyngeal swab, presence of viral mutation(s) within the areas targeted by this assay, and inadequate number of viral copies (<131 copies/mL). A negative result must be combined with clinical observations, patient history, and epidemiological information. The expected result is Negative.  Fact Sheet for Patients:  PinkCheek.be  Fact Sheet for Healthcare Providers:  GravelBags.it  This test is no t yet approved or cleared by the [place] FDA and  has been authorized for detection and/or diagnosis of SARS-CoV-2 by FDA under an Emergency Use Authorization (EUA). This EUA will remain  in effect (meaning this test can be used) for the duration of the COVID-19 declaration under Section 564(b)(1) of the Act, 21 U.S.C. section 360bbb-3(b)(1), unless the authorization is terminated or revoked sooner.     Influenza A by PCR NEGATIVE NEGATIVE Final   Influenza B by PCR NEGATIVE NEGATIVE Final    Comment:  (NOTE) The Xpert Xpress SARS-CoV-2/FLU/RSV assay is intended as an aid in  the diagnosis of influenza from Nasopharyngeal swab specimens and  should not be used as a sole basis for treatment. Nasal washings and  aspirates are unacceptable for Xpert Xpress SARS-CoV-2/FLU/RSV  testing.  Fact Sheet for Patients: PinkCheek.be  Fact Sheet for Healthcare Providers: GravelBags.it  This test is not yet approved or cleared by the [place] FDA and  has been authorized for detection and/or diagnosis of SARS-CoV-2 by  FDA under an Emergency Use Authorization (EUA). This EUA will remain  in effect (meaning this test can be used) for the duration of the  Covid-19 declaration under Section 564(b)(1) of the Act, 21  U.S.C. section 360bbb-3(b)(1), unless the authorization is  terminated or revoked. Performed at [facility], [address] [place],  [address]   Respiratory Panel by RT PCR (Flu A&B, Covid) - Nasopharyngeal Swab     Status: None   Collection Time: 08/07/20 12:02 PM   Specimen: Nasopharyngeal Swab  Result Value Ref Range Status   SARS Coronavirus 2 by RT PCR NEGATIVE NEGATIVE Final    Comment: (NOTE) SARS-CoV-2 target nucleic acids are NOT DETECTED.  The SARS-CoV-2 RNA is generally detectable in upper respiratoy specimens during the acute phase of infection. The lowest concentration of SARS-CoV-2 viral copies this assay can detect is 131 copies/mL. A negative result does not preclude SARS-Cov-2 infection and should not be used as the sole basis for treatment or other patient management decisions. A negative result may occur with  improper specimen collection/handling, submission of specimen other than nasopharyngeal swab, presence of viral mutation(s) within the areas targeted by this assay, and inadequate number of viral copies (<131 copies/mL). A negative result must be combined with clinical observations,  patient history, and epidemiological information. The expected result is Negative.  Fact Sheet for Patients:  PinkCheek.be  Fact Sheet for Healthcare Providers:  GravelBags.it  This test is no t yet approved or cleared by the [place] FDA and  has been authorized for detection and/or diagnosis of SARS-CoV-2 by FDA under an Emergency Use Authorization (EUA). This EUA will remain  in effect (meaning this test can be used) for the duration of the COVID-19 declaration under Section 564(b)(1) of the Act, 21 U.S.C. section 360bbb-3(b)(1), unless the  authorization is terminated or revoked sooner.     Influenza A by PCR NEGATIVE NEGATIVE Final   Influenza B by PCR NEGATIVE NEGATIVE Final    Comment: (NOTE) The Xpert Xpress SARS-CoV-2/FLU/RSV assay is intended as an aid in  the diagnosis of influenza from Nasopharyngeal swab specimens and  should not be used as a sole basis for treatment. Nasal washings and  aspirates are unacceptable for Xpert Xpress SARS-CoV-2/FLU/RSV  testing.  Fact Sheet for Patients: PinkCheek.be  Fact Sheet for Healthcare Providers: GravelBags.it  This test is not yet approved or cleared by the [place] FDA and  has been authorized for detection and/or diagnosis of SARS-CoV-2 by  FDA under an Emergency Use Authorization (EUA). This EUA will remain  in effect (meaning this test can be used) for the duration of the  Covid-19 declaration under Section 564(b)(1) of the Act, 21  U.S.C. section 360bbb-3(b)(1), unless the authorization is  terminated or revoked. Performed at [facility], [address], [place],  [address]      Radiological Exams on Admission: DG Chest Port 1 View  Result Date: 08/07/2020 CLINICAL DATA:  Syncopal episode. EXAM: PORTABLE CHEST 1 VIEW COMPARISON:  Chest radiograph July 29, 2020. FINDINGS:  Monitoring leads overlie the patient. Stable cardiac and mediastinal contours. Redemonstrated and slightly increased patchy consolidative opacities within the right mid and upper lung. No pleural effusion or pneumothorax. IMPRESSION: Increased airspace opacities within the right mid and upper lung may represent pneumonia in the appropriate clinical setting. Followup PA and lateral chest X-ray is recommended in 3-4 weeks following trial of antibiotic therapy to ensure resolution and exclude underlying malignancy. Electronically Signed   By: Lovey Newcomer M.D.   On: 08/07/2020 13:19    EKG: Independently reviewed. Sinus, nonspecific ST changes    Time spent:70 minutes Code Status:   FULL Family Communication:  Spouse updated at bedside Disposition Plan: expect 2 day hospitalization Consults called: none DVT Prophylaxis: [place] Lovenox  Orson Eva, DO  [facility] Pager [phone]  If 7PM-7AM, please contact night-coverage [email or web address] Password [email or web address] 08/07/2020, 3:58 PM

## 2020-08-07 NOTE — ED Provider Notes (Signed)
[facility] EMERGENCY DEPARTMENT Provider Note   CSN: [id number] Arrival date & time: 08/07/20  1042     History Chief Complaint  Patient presents with  . Near Syncope    Kevin Matthews is a 86 y.o. male.  Patient with a history of a stroke recently which makes it difficult for him to read.  Also has a history of hypertension.  Patient states she has been having fevers and chills and weakness.  He also states that he got up this morning and felt weak and nearly passed out.  Paramedics stated that when they came home he was orthostatic.  The history is provided by the patient and medical records. No language interpreter was used.  Near Syncope This is a new problem. The current episode started 12 to 24 hours ago. The problem occurs constantly. The problem has not changed since onset.Pertinent negatives include no chest pain, no abdominal pain and no headaches. Nothing aggravates the symptoms. Nothing relieves the symptoms. He has tried nothing for the symptoms. The treatment provided no relief.       Past Medical History:  Diagnosis Date  . Arthritis   . BPH (benign prostatic hyperplasia)   . GERD (gastroesophageal reflux disease)   . Heart murmur    dr Feliz Beam  . Hypercholesteremia     Patient Active Problem List   Diagnosis Date Noted  . Hyperlipidemia   . Benign prostatic hyperplasia without lower urinary tract symptoms   . Essential hypertension 07/29/2020  . Cerebrovascular accident (CVA) ([place]) 07/29/2020  . Toxic encephalopathy 01/15/2014  . CAP (community acquired pneumonia) 01/13/2014  . Altered mental status 01/13/2014  . Lumbar stenosis with neurogenic claudication 01/07/2014  . Lumbago 02/25/2013    Past Surgical History:  Procedure Laterality Date  . BACK SURGERY     x5 cervical plate;  Marland Kitchen CAROTID ENDARTERECTOMY Left   . CATARACT EXTRACTION W/PHACO Right 07/27/2015   Procedure: CATARACT EXTRACTION PHACO AND INTRAOCULAR LENS PLACEMENT (IOC);   Surgeon: Kevin Guys, MD;  Location: [facility];  Service: Ophthalmology;  Laterality: Right;  CDE:9.43  . CATARACT EXTRACTION W/PHACO Left 08/10/2015   Procedure: CATARACT EXTRACTION PHACO AND INTRAOCULAR LENS PLACEMENT (IOC);  Surgeon: Kevin Guys, MD;  Location: [facility];  Service: Ophthalmology;  Laterality: Left;  CDE:8.27  . LUMBAR LAMINECTOMY/DECOMPRESSION MICRODISCECTOMY N/A 01/07/2014   Procedure: Lumbar three-four, Lumbar four-five redo laminectomy and foraminotomy;  Surgeon: Ophelia Charter, MD;  Location: [facility];  Service: Neurosurgery;  Laterality: N/A;  Lumbar three-four, Lumbar four-five redo laminectomy and foraminotomy  . TONSILLECTOMY    . VASCULAR SURGERY         History reviewed. No pertinent family history.  Social History   Tobacco Use  . Smoking status: Former Smoker    Packs/day: 2.00    Years: 32.00    Pack years: 64.00    Types: Cigarettes    Quit date: 07/21/1979    Years since quitting: 41.0  . Smokeless tobacco: Never Used  Substance Use Topics  . Alcohol use: Yes    Alcohol/week: 8.0 standard drinks    Types: 8 Cans of beer per week    Comment: weekly  . Drug use: No    Home Medications Prior to Admission medications   Medication Sig Start Date End Date Taking? Authorizing Provider  acetaminophen (TYLENOL) 500 MG tablet Take 1,000 mg by mouth every 6 (six) hours as needed.    [provider]  Ascorbic Acid (VITAMIN  C) 500 MG CAPS Take 500 mg by mouth daily.    [provider]  aspirin 325 MG tablet Take 1 tablet (325 mg total) by mouth daily. 07/31/20   Barton Dubois, MD  Cholecalciferol (VITAMIN D3) 2000 UNITS TABS Take 2,000 Units by mouth daily.    [provider]  clopidogrel (PLAVIX) 75 MG tablet Take 1 tablet (75 mg total) by mouth daily with breakfast. 07/31/20   Barton Dubois, MD  docusate sodium (COLACE) 100 MG capsule Take 100 mg by mouth 2 (two) times daily.    [provider]  gabapentin  (NEURONTIN) 400 MG capsule Take 400 mg by mouth 3 (three) times daily.     [provider]  hydrALAZINE (APRESOLINE) 10 MG tablet Take 10 mg by mouth See admin instructions. Take 1/2 tablet by mouth if BP 120 plus standing, or if BP 180 plus sitting. Take 1 tablet by mouth daily as directed.    [provider]  ibuprofen (ADVIL,MOTRIN) 200 MG tablet Take 600 mg by mouth daily as needed for moderate pain.     [provider]  loratadine (CLARITIN) 10 MG tablet Take 1 tablet (10 mg total) by mouth daily. 07/31/20   Barton Dubois, MD  Multiple Vitamin (MULTIVITAMIN) capsule Take 1 capsule by mouth daily. 07/31/20   Barton Dubois, MD  omeprazole (PRILOSEC) 20 MG capsule Take 20 mg by mouth 2 (two) times daily before a meal.     [provider]  pravastatin (PRAVACHOL) 40 MG tablet Take 80 mg by mouth daily.    [provider]  tamsulosin (FLOMAX) 0.4 MG CAPS capsule Take 1 capsule (0.4 mg total) by mouth daily after supper. Patient taking differently: Take 0.4 mg by mouth 2 (two) times daily.  01/15/14   Nita Sells, MD    Allergies    Oxycodone  Review of Systems   Review of Systems  Constitutional: Positive for fever. Negative for appetite change and fatigue.  HENT: Negative for congestion, ear discharge and sinus pressure.   Eyes: Negative for discharge.  Respiratory: Negative for cough.   Cardiovascular: Positive for near-syncope. Negative for chest pain.  Gastrointestinal: Negative for abdominal pain and diarrhea.  Genitourinary: Negative for frequency and hematuria.  Musculoskeletal: Negative for back pain.  Skin: Negative for rash.  Neurological: Negative for seizures and headaches.  Psychiatric/Behavioral: Negative for hallucinations.    Physical Exam Updated Vital Signs BP 134/79   Pulse 64   Temp 98.3 F (36.8 C) (Oral)   Resp 16   Ht 5\' 9"  (1.753 m)   Wt 74.8 kg   SpO2 95%   BMI 24.37 kg/m   Physical Exam Vitals  and nursing note reviewed.  Constitutional:      Appearance: He is well-developed.  HENT:     Head: Normocephalic.     Nose: Nose normal.  Eyes:     General: No scleral icterus.    Conjunctiva/sclera: Conjunctivae normal.  Neck:     Thyroid: No thyromegaly.  Cardiovascular:     Rate and Rhythm: Normal rate and regular rhythm.     Heart sounds: No murmur heard.  No friction rub. No gallop.   Pulmonary:     Breath sounds: No stridor. No wheezing or rales.  Chest:     Chest wall: No tenderness.  Abdominal:     General: There is no distension.     Tenderness: There is no abdominal tenderness. There is no rebound.  Musculoskeletal:  General: Normal range of motion.     Cervical back: Neck supple.  Lymphadenopathy:     Cervical: No cervical adenopathy.  Skin:    Findings: No erythema or rash.  Neurological:     Mental Status: He is alert and oriented to person, place, and time.     Motor: No abnormal muscle tone.     Coordination: Coordination normal.  Psychiatric:        Behavior: Behavior normal.     ED Results / Procedures / Treatments   Labs (all labs ordered are listed, but only abnormal results are displayed) Labs Reviewed  CBC WITH DIFFERENTIAL/PLATELET - Abnormal; Notable for the following components:      Result Value   WBC 24.5 (*)    RBC 3.88 (*)    Hemoglobin 11.1 (*)    HCT 34.5 (*)    Neutro Abs 20.7 (*)    Monocytes Absolute 1.3 (*)    Abs Immature Granulocytes 0.19 (*)    All other components within normal limits  COMPREHENSIVE METABOLIC PANEL - Abnormal; Notable for the following components:   Glucose, Bld 110 (*)    Calcium 8.7 (*)    Albumin 3.0 (*)    GFR, Estimated 59 (*)    All other components within normal limits  URINALYSIS, ROUTINE W REFLEX MICROSCOPIC - Abnormal; Notable for the following components:   Color, Urine STRAW (*)    All other components within normal limits  TROPONIN I (HIGH SENSITIVITY) - Abnormal; Notable for the  following components:   Troponin I (High Sensitivity) 21 (*)    All other components within normal limits  TROPONIN I (HIGH SENSITIVITY) - Abnormal; Notable for the following components:   Troponin I (High Sensitivity) 21 (*)    All other components within normal limits  RESPIRATORY PANEL BY RT PCR (FLU A&B, COVID)  CULTURE, BLOOD (ROUTINE X 2)  CULTURE, BLOOD (ROUTINE X 2)  MRSA PCR SCREENING  LACTIC ACID, PLASMA    EKG None  Radiology DG Chest Port 1 View  Result Date: 08/07/2020 CLINICAL DATA:  Syncopal episode. EXAM: PORTABLE CHEST 1 VIEW COMPARISON:  Chest radiograph July 29, 2020. FINDINGS: Monitoring leads overlie the patient. Stable cardiac and mediastinal contours. Redemonstrated and slightly increased patchy consolidative opacities within the right mid and upper lung. No pleural effusion or pneumothorax. IMPRESSION: Increased airspace opacities within the right mid and upper lung may represent pneumonia in the appropriate clinical setting. Followup PA and lateral chest X-ray is recommended in 3-4 weeks following trial of antibiotic therapy to ensure resolution and exclude underlying malignancy. Electronically Signed   By: Lovey Newcomer M.D.   On: 08/07/2020 13:19    Procedures Procedures (including critical care time)  Medications Ordered in ED Medications  vancomycin (VANCOREADY) IVPB 1750 mg/350 mL (has no administration in time range)  ceFEPIme (MAXIPIME) 2 g in sodium chloride 0.9 % 100 mL IVPB (has no administration in time range)  sodium chloride 0.9 % bolus 1,000 mL (has no administration in time range)    ED Course  I have reviewed the triage vital signs and the nursing notes.  Pertinent labs & imaging results that were available during my care of the patient were reviewed by me and considered in my medical decision making (see chart for details).    MDM Rules/Calculators/A&P                          Patient with fevers and chills  and pneumonia.  He will  be admitted for IV antibiotics by the hospitalist     This patient presents to the ED for concern of fever chills and near syncope this involves an extensive number of treatment options, and is a complaint that carries with it a high risk of complications and morbidity.  The differential diagnosis includes pneumonia stroke   Lab Tests:   I Ordered, reviewed, and interpreted labs, which included CBC chemistries that showed elevated white count  Medicines ordered:   I ordered medication antibiotics for pneumonia  Imaging Studies ordered:   I ordered imaging studies which included chest x-ray  I independently visualized and interpreted imaging which showed pneumonia  Additional history obtained:   Additional history obtained from wife  Previous records obtained and reviewed.  Consultations Obtained:   I consulted hospitalist and discussed lab and imaging findings  Reevaluation:  After the interventions stated above, I reevaluated the patient and found no change  Critical Interventions:  .   Final Clinical Impression(s) / ED Diagnoses Final diagnoses:  None    Rx / DC Orders ED Discharge Orders    None       Milton Ferguson, MD 08/07/20 1709

## 2020-08-08 ENCOUNTER — Other Ambulatory Visit: Payer: Self-pay

## 2020-08-08 DIAGNOSIS — J181 Lobar pneumonia, unspecified organism: Secondary | ICD-10-CM | POA: Diagnosis not present

## 2020-08-08 LAB — CBC
HCT: 35.1 % — ABNORMAL LOW (ref 39.0–52.0)
Hemoglobin: 11.3 g/dL — ABNORMAL LOW (ref 13.0–17.0)
MCH: 28.7 pg (ref 26.0–34.0)
MCHC: 32.2 g/dL (ref 30.0–36.0)
MCV: 89.1 fL (ref 80.0–100.0)
Platelets: 304 10*3/uL (ref 150–400)
RBC: 3.94 MIL/uL — ABNORMAL LOW (ref 4.22–5.81)
RDW: 14.6 % (ref 11.5–15.5)
WBC: 18.6 10*3/uL — ABNORMAL HIGH (ref 4.0–10.5)
nRBC: 0 % (ref 0.0–0.2)

## 2020-08-08 LAB — BASIC METABOLIC PANEL
Anion gap: 7 (ref 5–15)
BUN: 17 mg/dL (ref 8–23)
CO2: 26 mmol/L (ref 22–32)
Calcium: 9.2 mg/dL (ref 8.9–10.3)
Chloride: 105 mmol/L (ref 98–111)
Creatinine, Ser: 0.95 mg/dL (ref 0.61–1.24)
GFR, Estimated: >60 mL/min (ref >60)
Glucose, Bld: 99 mg/dL (ref 70–99)
Potassium: 4.4 mmol/L (ref 3.5–5.1)
Sodium: 138 mmol/L (ref 135–145)

## 2020-08-08 LAB — STREP PNEUMONIAE URINARY ANTIGEN: Strep Pneumo Urinary Antigen: NEGATIVE

## 2020-08-08 MED ORDER — TRAMADOL HCL 50 MG PO TABS
50.0000 mg | ORAL_TABLET | Freq: Three times a day (TID) | ORAL | Status: DC | PRN
  Administered 2020-08-08 – 2020-08-09 (×3): 50 mg via ORAL
  Filled 2020-08-08 (×3): qty 1

## 2020-08-08 MED ORDER — ROPINIROLE HCL 0.25 MG PO TABS
0.5000 mg | ORAL_TABLET | Freq: Every day | ORAL | Status: DC
  Administered 2020-08-08: 0.5 mg via ORAL
  Filled 2020-08-08 (×2): qty 2

## 2020-08-08 MED ORDER — SODIUM CHLORIDE 0.9 % IV SOLN: INTRAVENOUS | Status: AC

## 2020-08-08 MED ORDER — ROPINIROLE HCL 0.25 MG PO TABS
0.2500 mg | ORAL_TABLET | Freq: Every day | ORAL | Status: DC
  Filled 2020-08-08: qty 1

## 2020-08-08 NOTE — ED Notes (Signed)
Pt up to recliner with moderate assist. Pt reports his back pain is better now that he is positioned differently.

## 2020-08-08 NOTE — ED Notes (Addendum)
Pt's O2 increased to 4L via [place] and pt's HOB increased per Dr. Carles Collet and O2 sats continue to drop into the 70s while sleeping. Dr. Carles Collet aware and gave order for cpap with autotitration. RT notified.

## 2020-08-08 NOTE — ED Notes (Signed)
Attempted to cal report, nurse at lunch was asked to call back in 15 min

## 2020-08-08 NOTE — Progress Notes (Addendum)
PROGRESS NOTE  Kevin Matthews HBZ:[id number] DOB: 08-Aug-1934 DOA: 08/07/2020 PCP: [facility]  Brief History:  86 y.o. male with medical history of hypertension, hyperlipidemia, GERD, recent stroke presenting with generalized weakness and fever.  The patient was recently discharged from the hospital after a stay from 07/29/2020 to 07/31/20 when he was treated for an acute ischemic stroke.  CT of the brain on 07/29/2020 showed an acute versus early subacute infarct in the posterior left temporal and occipital lobes.  The patient was discharged home with aspirin 325 mg and Plavix 75 mg daily.  The patient states that he was gradually feeling better until the morning of 08/07/2020 when he experienced fevers and chills.  His wife took his temperature, and it was 100.7 F.  He had a feeling of generalized weakness to the point where he needed assistance to get off the bed and into the chair in his living room.  The patient had a near syncopal episode when he was getting back from the bathroom to his bedroom which is when he began having this generalized weakness feeling with his fevers and chills.  He had denied any headache, neck pain, chest pain, shortness breath, coughing, hemoptysis, nausea, vomiting, diarrhea, abdominal pain, dysuria, hematuria.  However he has had polyuria.  He denies any new medications besides taking higher dose aspirin.  Notably, the patient states that his tamsulosin has been increased from once daily to twice daily 2 weeks ago from his [place].  In addition, the patient notes that he has had some abnormality on the right side of [facility] lung [facility]  Apparently, they told him that he eventually need some type of biopsy. In the emergency department, the patient was afebrile hemodynamically stable with oxygen saturation 96% room air.  BMP was essentially unremarkable with serum creatinine at baseline.  LFTs were unremarkable.  WBC  24.5, hemoglobin 1.1, platelets 240,000.  Lactic acid 1.3.  Troponins were unremarkable 21>>> 21.  Chest x-ray showed increasing right upper lobe and right middle lobe opacity.  UA was negative for pyuria.  EKG showed sinus rhythm with nonspecific ST changes  Assessment/Plan: Lobar pneumonia -continue cefepime and azithromycin -Check procalcitonin <0.10 -Urine Legionella antigen--pending -CT chest--clusters GGO & nodular densities right lung -Follow blood cultures--neg to date  Near syncope/generalized weakness -Likely secondary to infectious process -Check orthostatics -Continue IV fluids -Monitor on telemetry--no concerning dyrhythmia  Recent acute ischemic stroke -Cannot have MRI secondary to spinal stimulator -Continue aspirin and Plavix -Continue statin  Impaired glucose tolerance -Hemoglobin A1c 5.8  Hyperlipidemia -Continue statin  Essential hypertension -Plan to allow permissive hypertension at this point given his orthostasis type symptoms  BPH -Continue tamsulosin      Status is: Inpatient  Remains inpatient appropriate because:IV treatments appropriate due to intensity of illness or inability to take PO   Dispo: The patient is from: Home              Anticipated d/c is to: Home              Anticipated d/c date is: 08/09/20              Patient currently is not medically stable to d/c.        Family Communication:   Spouse updated at bedside 10/10  Consultants:  none  Code Status:  FULL  DVT Prophylaxis:  [place] Lovenox   Procedures: As  Listed in Progress Note Above  Antibiotics: azithro 10/9>> Cefepime 10/9>>     Subjective: Patient denies fevers, chills, headache, chest pain, dyspnea, nausea, vomiting, diarrhea, abdominal pain, dysuria, hematuria, hematochezia, and melena.   Objective: Vitals:   08/08/20 1429 08/08/20 1431 08/08/20 1454 08/08/20 1528  BP: (!) 150/79 (!) 82/59 (!) 155/80 136/84  Pulse: 62 66 64 66  Resp:  15 14 (!) 21 18  Temp:    98 F (36.7 C)  TempSrc:    Oral  SpO2: 94% 96% 97% 96%  Weight:      Height:        Intake/Output Summary (Last 24 hours) at 08/08/2020 1730 Last data filed at 08/08/2020 1533 Gross per 24 hour  Intake 1200 ml  Output --  Net 1200 ml   Weight change:  Exam:   General:  Pt is alert, follows commands appropriately, not in acute distress  HEENT: No icterus, No thrush, No neck mass, [place]/AT  Cardiovascular: RRR, S1/S2, no rubs, no gallops  Respiratory:bibasilar rales. No wheeze  Abdomen: Soft/+BS, non tender, non distended, no guarding  Extremities: No edema, No lymphangitis, No petechiae, No rashes, no synovitis   Data Reviewed: I have personally reviewed following labs and imaging studies Basic Metabolic Panel: Recent Labs  Lab 08/07/20 1234 08/08/20 0530  NA 138 138  K 4.2 4.4  CL 104 105  CO2 25 26  GLUCOSE 110* 99  BUN 20 17  CREATININE 1.14 0.95  CALCIUM 8.7* 9.2   Liver Function Tests: Recent Labs  Lab 08/07/20 1234  AST 20  ALT 14  ALKPHOS 67  BILITOT 0.7  PROT 6.9  ALBUMIN 3.0*   No results for input(s): LIPASE, AMYLASE in the last 168 hours. No results for input(s): AMMONIA in the last 168 hours. Coagulation Profile: No results for input(s): INR, PROTIME in the last 168 hours. CBC: Recent Labs  Lab 08/07/20 1234 08/08/20 0530  WBC 24.5* 18.6*  NEUTROABS 20.7*  --   HGB 11.1* 11.3*  HCT 34.5* 35.1*  MCV 88.9 89.1  PLT 314 304   Cardiac Enzymes: No results for input(s): CKTOTAL, CKMB, CKMBINDEX, TROPONINI in the last 168 hours. BNP: Invalid input(s): POCBNP CBG: No results for input(s): GLUCAP in the last 168 hours. HbA1C: No results for input(s): HGBA1C in the last 72 hours. Urine analysis:    Component Value Date/Time   COLORURINE STRAW (A) 08/07/2020 1258   APPEARANCEUR CLEAR 08/07/2020 1258   LABSPEC 1.010 08/07/2020 1258   PHURINE 6.0 08/07/2020 1258   GLUCOSEU NEGATIVE 08/07/2020 1258    HGBUR NEGATIVE 08/07/2020 [place] 08/07/2020 1258   KETONESUR NEGATIVE 08/07/2020 1258   PROTEINUR NEGATIVE 08/07/2020 1258   UROBILINOGEN 0.2 01/22/2014 0930   NITRITE NEGATIVE 08/07/2020 [place] 08/07/2020 1258   Sepsis Labs: @LABRCNTIP (procalcitonin:4,lacticidven:4) ) Recent Results (from the past 240 hour(s))  Respiratory Panel by RT PCR (Flu A&B, Covid) - Nasopharyngeal Swab     Status: None   Collection Time: 08/07/20 12:02 PM   Specimen: Nasopharyngeal Swab  Result Value Ref Range Status   SARS Coronavirus 2 by RT PCR NEGATIVE NEGATIVE Final    Comment: (NOTE) SARS-CoV-2 target nucleic acids are NOT DETECTED.  The SARS-CoV-2 RNA is generally detectable in upper respiratoy specimens during the acute phase of infection. The lowest concentration of SARS-CoV-2 viral copies this assay can detect is 131 copies/mL. A negative result does not preclude SARS-Cov-2 infection and should not be used as the sole  basis for treatment or other patient management decisions. A negative result may occur with  improper specimen collection/handling, submission of specimen other than nasopharyngeal swab, presence of viral mutation(s) within the areas targeted by this assay, and inadequate number of viral copies (<131 copies/mL). A negative result must be combined with clinical observations, patient history, and epidemiological information. The expected result is Negative.  Fact Sheet for Patients:  PinkCheek.be  Fact Sheet for Healthcare Providers:  GravelBags.it  This test is no t yet approved or cleared by the [place] FDA and  has been authorized for detection and/or diagnosis of SARS-CoV-2 by FDA under an Emergency Use Authorization (EUA). This EUA will remain  in effect (meaning this test can be used) for the duration of the COVID-19 declaration under Section 564(b)(1) of the Act, 21  U.S.C. section 360bbb-3(b)(1), unless the authorization is terminated or revoked sooner.     Influenza A by PCR NEGATIVE NEGATIVE Final   Influenza B by PCR NEGATIVE NEGATIVE Final    Comment: (NOTE) The Xpert Xpress SARS-CoV-2/FLU/RSV assay is intended as an aid in  the diagnosis of influenza from Nasopharyngeal swab specimens and  should not be used as a sole basis for treatment. Nasal washings and  aspirates are unacceptable for Xpert Xpress SARS-CoV-2/FLU/RSV  testing.  Fact Sheet for Patients: PinkCheek.be  Fact Sheet for Healthcare Providers: GravelBags.it  This test is not yet approved or cleared by the [place] FDA and  has been authorized for detection and/or diagnosis of SARS-CoV-2 by  FDA under an Emergency Use Authorization (EUA). This EUA will remain  in effect (meaning this test can be used) for the duration of the  Covid-19 declaration under Section 564(b)(1) of the Act, 21  U.S.C. section 360bbb-3(b)(1), unless the authorization is  terminated or revoked. Performed at [facility], [address], [place], [place] [address]   MRSA PCR Screening     Status: None   Collection Time: 08/07/20  2:34 PM   Specimen: Nasal Mucosa; Nasopharyngeal  Result Value Ref Range Status   MRSA by PCR NEGATIVE NEGATIVE Final    Comment:        The GeneXpert MRSA Assay (FDA approved for NASAL specimens only), is one component of a comprehensive MRSA colonization surveillance program. It is not intended to diagnose MRSA infection nor to guide or monitor treatment for MRSA infections. Performed at [facility], [address], [place], [place] [address]   Blood culture (routine x 2)     Status: None (Preliminary result)   Collection Time: 08/07/20  4:50 PM   Specimen: BLOOD LEFT FOREARM  Result Value Ref Range Status   Specimen Description BLOOD LEFT FOREARM  Final   Special Requests   Final    BOTTLES DRAWN  AEROBIC AND ANAEROBIC Blood Culture adequate volume Performed at [facility], [address] [place], [place] [address]    Culture PENDING  Incomplete   Report Status PENDING  Incomplete  Blood culture (routine x 2)     Status: None (Preliminary result)   Collection Time: 08/07/20  4:50 PM   Specimen: BLOOD RIGHT HAND  Result Value Ref Range Status   Specimen Description BLOOD RIGHT HAND  Final   Special Requests   Final    BOTTLES DRAWN AEROBIC AND ANAEROBIC Blood Culture adequate volume Performed at [facility], [address] [place], [place] [address]    Culture PENDING  Incomplete   Report Status PENDING  Incomplete     Scheduled Meds: .  ascorbic acid  500 mg Oral Daily  . aspirin  325 mg Oral Daily  . azithromycin  500 mg Oral Daily  . cholecalciferol  2,000 Units Oral Daily  . clopidogrel  75 mg Oral Q breakfast  . docusate sodium  100 mg Oral BID  . enoxaparin (LOVENOX) injection  40 mg Subcutaneous Q24H  . gabapentin  400 mg Oral TID  . loratadine  10 mg Oral Daily  . pantoprazole  40 mg Oral Daily  . pravastatin  80 mg Oral Daily  . rOPINIRole  0.25 mg Oral QHS  . tamsulosin  0.4 mg Oral QPC supper   Continuous Infusions: . sodium chloride 75 mL/hr at 08/08/20 1709  . ceFEPime (MAXIPIME) IV 2 g (08/08/20 1712)    Procedures/Studies: CT ANGIO HEAD W OR WO CONTRAST  Result Date: 07/29/2020 CLINICAL DATA:  Blurry vision. EXAM: CT ANGIOGRAPHY HEAD AND NECK TECHNIQUE: Multidetector CT imaging of the head and neck was performed using the standard protocol during bolus administration of intravenous contrast. Multiplanar CT image reconstructions and MIPs were obtained to evaluate the vascular anatomy. Carotid stenosis measurements (when applicable) are obtained utilizing NASCET criteria, using the distal internal carotid diameter as the denominator. CONTRAST:  136mL OMNIPAQUE IOHEXOL 350 MG/ML SOLN COMPARISON:  None. FINDINGS: CTA NECK FINDINGS SKELETON: There is no bony  spinal canal stenosis. No lytic or blastic lesion. OTHER NECK: Normal pharynx, larynx and major salivary glands. No cervical lymphadenopathy. Unremarkable thyroid gland. UPPER CHEST: No pneumothorax or pleural effusion. No nodules or masses. AORTIC ARCH: There is mild calcific atherosclerosis of the aortic arch. There is no aneurysm, dissection or hemodynamically significant stenosis of the visualized portion of the aorta. Conventional 3 vessel aortic branching pattern. The visualized proximal subclavian arteries are widely patent. RIGHT CAROTID SYSTEM: Normal without aneurysm, dissection or stenosis. LEFT CAROTID SYSTEM: Normal without aneurysm, dissection or stenosis. VERTEBRAL ARTERIES: Left dominant configuration. Both origins are clearly patent. There is no dissection, occlusion or flow-limiting stenosis to the skull base (V1-V3 segments). CTA HEAD FINDINGS POSTERIOR CIRCULATION: --Vertebral arteries: Normal V4 segments. --Inferior cerebellar arteries: Normal. --Basilar artery: Normal. --Superior cerebellar arteries: Normal. --Posterior cerebral arteries (PCA): Normal. ANTERIOR CIRCULATION: --Intracranial internal carotid arteries: Atherosclerotic calcification of the internal carotid arteries at the skull base without hemodynamically significant stenosis. --Anterior cerebral arteries (ACA): Normal. Both A1 segments are present. Patent anterior communicating artery (a-comm). --Middle cerebral arteries (MCA): Normal. VENOUS SINUSES: As permitted by contrast timing, patent. ANATOMIC VARIANTS: None Review of the MIP images confirms the above findings. IMPRESSION: No emergent large vessel occlusion or high-grade stenosis of the intracranial or cervical arteries. Aortic atherosclerosis (ICD10-I70.0). Electronically Signed   By: Ulyses Jarred M.D.   On: 07/29/2020 23:10   DG Chest 2 View  Result Date: 07/29/2020 CLINICAL DATA:  Blurry vision. EXAM: CHEST - 2 VIEW COMPARISON:  January 14, 2014 FINDINGS: Mild areas  of atelectasis and/or infiltrate are seen within the mid right lung and right lung base. There is no evidence of a pleural effusion or pneumothorax. The heart size and mediastinal contours are within normal limits. There is mild calcification of the aortic arch. A radiopaque fusion plate and screws are seen overlying the lower cervical spine. Mild to moderate severity dextroscoliosis of the upper thoracic spine is noted. IMPRESSION: Mild mid right lung and right basilar atelectasis and/or infiltrate. Electronically Signed   By: Virgina Norfolk M.D.   On: 07/29/2020 23:06   CT HEAD WO CONTRAST  Result Date: 07/29/2020 CLINICAL DATA:  Possible TIA versus stroke. Weakness. Difficulty discerning words. EXAM: CT HEAD WITHOUT CONTRAST TECHNIQUE: Contiguous axial images were obtained from the base of the skull through the vertex without intravenous contrast. COMPARISON:  CT head January 13, 2014 FINDINGS: Brain: There is a new area of cytotoxic edema and loss of gray-white differentiation involving the posterior left temporal lobe and lateral occipital lobe, compatible with acute or early subacute infarct. Mild local mass effect with sulcal effacement. No midline shift. Basal cisterns are patent. No acute hemorrhage. Similar focal hypodensity in the inferior left basal ganglia, likely a dilated perivascular space. No hydrocephalus. Vascular: No hyperdense vessel identified. Calcific atherosclerosis. Skull: Normal. Negative for fracture or focal lesion. Sinuses/Orbits: Visualized sinuses are clear. No acute orbital abnormality. Other: No mastoid effusion. IMPRESSION: Acute or early subacute infarct in the posterior left temporal and lateral occipital lobe. Sulcal effacement without midline shift. No evidence of hemorrhagic transformation. Consider MRI for further evaluation. These findings were discussed with Dr. Eulis Foster at 4:20 p.m. via telephone. Electronically Signed   By: Margaretha Sheffield MD   On: 07/29/2020 16:23    CT HEAD W CONTRAST  Result Date: 07/30/2020 CLINICAL DATA:  86 year old male presenting with weakness and abnormal speech. Noncontrast head CT yesterday with acute to subacute appearing cortical infarct in the posterior hemisphere at the left PCA/MCA watershed. Not a candidate for MRI due to spinal stimulator. EXAM: CT HEAD WITH CONTRAST TECHNIQUE: Contiguous axial images were obtained from the base of the skull through the vertex with intravenous contrast. CONTRAST:  153mL OMNIPAQUE IOHEXOL 300 MG/ML  SOLN COMPARISON:  Head CT, CTA head and neck yesterday. FINDINGS: Brain: Stable appearance of cytotoxic edema at the junction of the lateral left occipital and posterior left temporal lobes. Mild luxury perfusion related enhancement. No evidence of associated hemorrhage on this post-contrast study. No significant mass effect. Stable gray-white matter differentiation elsewhere. No suspicious intracranial enhancement. No ventriculomegaly. Vascular: Calcified atherosclerosis at the skull base. Major intracranial vascular structures are enhancing as expected, including the proximal left PCA and the major dural venous sinuses. Skull: No acute osseous abnormality identified. Sinuses/Orbits: Visualized paranasal sinuses and mastoids are stable and well pneumatized. Other: No acute orbit or scalp soft tissue findings. IMPRESSION: 1. Stable posterior left cerebral infarct since yesterday. No hemorrhagic transformation or mass effect. 2. No new intracranial abnormality. Electronically Signed   By: Genevie Ann M.D.   On: 07/30/2020 22:03   CT ANGIO NECK W OR WO CONTRAST  Result Date: 07/29/2020 CLINICAL DATA:  Blurry vision. EXAM: CT ANGIOGRAPHY HEAD AND NECK TECHNIQUE: Multidetector CT imaging of the head and neck was performed using the standard protocol during bolus administration of intravenous contrast. Multiplanar CT image reconstructions and MIPs were obtained to evaluate the vascular anatomy. Carotid stenosis  measurements (when applicable) are obtained utilizing NASCET criteria, using the distal internal carotid diameter as the denominator. CONTRAST:  145mL OMNIPAQUE IOHEXOL 350 MG/ML SOLN COMPARISON:  None. FINDINGS: CTA NECK FINDINGS SKELETON: There is no bony spinal canal stenosis. No lytic or blastic lesion. OTHER NECK: Normal pharynx, larynx and major salivary glands. No cervical lymphadenopathy. Unremarkable thyroid gland. UPPER CHEST: No pneumothorax or pleural effusion. No nodules or masses. AORTIC ARCH: There is mild calcific atherosclerosis of the aortic arch. There is no aneurysm, dissection or hemodynamically significant stenosis of the visualized portion of the aorta. Conventional 3 vessel aortic branching pattern. The visualized proximal subclavian arteries are widely patent. RIGHT CAROTID SYSTEM: Normal without aneurysm, dissection or stenosis. LEFT CAROTID SYSTEM: Normal  without aneurysm, dissection or stenosis. VERTEBRAL ARTERIES: Left dominant configuration. Both origins are clearly patent. There is no dissection, occlusion or flow-limiting stenosis to the skull base (V1-V3 segments). CTA HEAD FINDINGS POSTERIOR CIRCULATION: --Vertebral arteries: Normal V4 segments. --Inferior cerebellar arteries: Normal. --Basilar artery: Normal. --Superior cerebellar arteries: Normal. --Posterior cerebral arteries (PCA): Normal. ANTERIOR CIRCULATION: --Intracranial internal carotid arteries: Atherosclerotic calcification of the internal carotid arteries at the skull base without hemodynamically significant stenosis. --Anterior cerebral arteries (ACA): Normal. Both A1 segments are present. Patent anterior communicating artery (a-comm). --Middle cerebral arteries (MCA): Normal. VENOUS SINUSES: As permitted by contrast timing, patent. ANATOMIC VARIANTS: None Review of the MIP images confirms the above findings. IMPRESSION: No emergent large vessel occlusion or high-grade stenosis of the intracranial or cervical arteries.  Aortic atherosclerosis (ICD10-I70.0). Electronically Signed   By: Ulyses Jarred M.D.   On: 07/29/2020 23:10   CT CHEST W CONTRAST  Result Date: 08/07/2020 CLINICAL DATA:  86 year old male with pneumonia. EXAM: CT CHEST WITH CONTRAST TECHNIQUE: Multidetector CT imaging of the chest was performed during intravenous contrast administration. CONTRAST:  15mL OMNIPAQUE IOHEXOL 300 MG/ML  SOLN COMPARISON:  Chest radiograph dated 08/07/2020. FINDINGS: Cardiovascular: There is no cardiomegaly or pericardial effusion. Advanced 3 vessel coronary vascular calcification. There is advanced atherosclerotic calcification of the thoracic aorta. No aneurysmal dilatation or dissection. The origins of the great vessels of the aortic arch appear patent. The central pulmonary arteries are unremarkable. Mediastinum/Nodes: Mildly enlarged right paratracheal lymph nodes, likely reactive. The esophagus is grossly unremarkable. No mediastinal fluid collection. Lungs/Pleura: Clusters of ground-glass and nodular densities primarily in the right lung and right upper lobe most consistent with multifocal pneumonia, likely viral or atypical in etiology. Aspiration is not excluded. Clinical correlation is recommended. There is no pleural effusion pneumothorax. The central airways are patent. Upper Abdomen: No acute abnormality. Musculoskeletal: Degenerative changes of the spine. No acute osseous pathology. Thoracic spinal stimulator wire. Lower cervical ACDF. IMPRESSION: Multifocal pneumonia, likely viral or atypical in etiology. Aspiration is not excluded. Clinical correlation is recommended. Electronically Signed   By: Anner Crete M.D.   On: 08/07/2020 22:24   US Carotid Bilateral (at [facility] and [facility] only)  Result Date: 07/30/2020 CLINICAL DATA:  Stroke symptoms, carotid atherosclerosis, previous left carotid endarterectomy EXAM: BILATERAL CAROTID DUPLEX ULTRASOUND TECHNIQUE: Pearline Cables scale imaging, color Doppler and duplex ultrasound were  performed of bilateral carotid and vertebral arteries in the neck. COMPARISON:  07/29/2020 FINDINGS: Criteria: Quantification of carotid stenosis is based on velocity parameters that correlate the residual internal carotid diameter with NASCET-based stenosis levels, using the diameter of the distal internal carotid lumen as the denominator for stenosis measurement. The following velocity measurements were obtained: RIGHT ICA: 233/47 cm/sec CCA: 94/49 cm/sec SYSTOLIC ICA/CCA RATIO:  4.6 ECA: 87 cm/sec LEFT ICA: 122/41 cm/sec CCA: 67/59 cm/sec SYSTOLIC ICA/CCA RATIO:  2.5 ECA: 60 cm/sec RIGHT CAROTID ARTERY: Heterogeneous irregular calcified right carotid bifurcation atherosclerosis. Proximal ICA velocity elevation measures up to 233/47 centimeters/second with spectral broadening and mild turbulent flow. Right ICA stenosis estimated at greater than 70% by ultrasound criteria. RIGHT VERTEBRAL ARTERY:  Normal antegrade flow LEFT CAROTID ARTERY: Similar scattered minor echogenic plaque formation. No hemodynamically significant left ICA stenosis, velocity elevation, or turbulent flow. LEFT VERTEBRAL ARTERY:  Normal antegrade flow IMPRESSION: Right ICA stenosis estimated at greater than 70% by ultrasound criteria. Left ICA narrowing less than 50% Normal antegrade vertebral flow bilaterally Electronically Signed   By: Jerilynn Mages.  Shick M.D.   On: 07/30/2020 10:33  DG Chest Port 1 View  Result Date: 08/07/2020 CLINICAL DATA:  Syncopal episode. EXAM: PORTABLE CHEST 1 VIEW COMPARISON:  Chest radiograph July 29, 2020. FINDINGS: Monitoring leads overlie the patient. Stable cardiac and mediastinal contours. Redemonstrated and slightly increased patchy consolidative opacities within the right mid and upper lung. No pleural effusion or pneumothorax. IMPRESSION: Increased airspace opacities within the right mid and upper lung may represent pneumonia in the appropriate clinical setting. Followup PA and lateral chest X-ray is  recommended in 3-4 weeks following trial of antibiotic therapy to ensure resolution and exclude underlying malignancy. Electronically Signed   By: Lovey Newcomer M.D.   On: 08/07/2020 13:19   ECHOCARDIOGRAM COMPLETE  Result Date: 07/30/2020    ECHOCARDIOGRAM REPORT   Patient Name:   Kevin Matthews Date of Exam: 07/30/2020 Medical Rec #:  [id number]         Height:       69.0 in Accession #:    [id number]        Weight:       165.0 lb Date of Birth:  01-09-1934         BSA:          1.904 m Patient Age:    6 years          BP:           158/101 mmHg Patient Gender: M                 HR:           71 bpm. Exam Location:  [facility] Procedure: 2D Echo, Cardiac Doppler and Color Doppler Indications:    Stroke 434.91 / I163.9  History:        Patient has no prior history of Echocardiogram examinations.                 Risk Factors:Hypertension and Dyslipidemia.  Sonographer:    Alvino Chapel RCS Referring Phys: [place]  1. Left ventricular ejection fraction, by estimation, is 55 to 60%. The left ventricle has normal function. The left ventricle has no regional wall motion abnormalities. Left ventricular diastolic parameters are indeterminate.  2. Right ventricular systolic function is normal. The right ventricular size is normal. Tricuspid regurgitation signal is inadequate for assessing PA pressure.  3. The mitral valve is grossly normal. Mild mitral valve regurgitation.  4. The aortic valve is tricuspid. There is moderate calcification of the aortic valve. Aortic valve regurgitation is not visualized. Mild to moderate aortic valve sclerosis/calcification is present, without any evidence of aortic stenosis.  5. The inferior vena cava is normal in size with greater than 50% respiratory variability, suggesting right atrial pressure of 3 mmHg. FINDINGS  Left Ventricle: Left ventricular ejection fraction, by estimation, is 55 to 60%. The left ventricle has normal function. The left ventricle  has no regional wall motion abnormalities. The left ventricular internal cavity size was normal in size. There is  no left ventricular hypertrophy. Left ventricular diastolic parameters are indeterminate. Right Ventricle: The right ventricular size is normal. No increase in right ventricular wall thickness. Right ventricular systolic function is normal. Tricuspid regurgitation signal is inadequate for assessing PA pressure. Left Atrium: Left atrial size was normal in size. Right Atrium: Right atrial size was normal in size. Pericardium: There is no evidence of pericardial effusion. Presence of pericardial fat pad. Mitral Valve: The mitral valve is grossly normal. Mild mitral valve regurgitation. Tricuspid Valve: The tricuspid valve is  grossly normal. Tricuspid valve regurgitation is trivial. Aortic Valve: The aortic valve is tricuspid. There is moderate calcification of the aortic valve. There is moderate aortic valve annular calcification. Aortic valve regurgitation is not visualized. Mild to moderate aortic valve sclerosis/calcification is  present, without any evidence of aortic stenosis. Pulmonic Valve: The pulmonic valve was grossly normal. Pulmonic valve regurgitation is trivial. Aorta: The aortic root is normal in size and structure. Venous: The inferior vena cava is normal in size with greater than 50% respiratory variability, suggesting right atrial pressure of 3 mmHg. IAS/Shunts: No atrial level shunt detected by color flow Doppler.  LEFT VENTRICLE PLAX 2D LVIDd:         4.82 cm  Diastology LVIDs:         3.58 cm  LV e' medial:    3.70 cm/s LV PW:         0.95 cm  LV E/e' medial:  13.5 LV IVS:        1.07 cm  LV e' lateral:   5.44 cm/s LVOT diam:     1.90 cm  LV E/e' lateral: 9.2 LV SV:         55 LV SV Index:   29 LVOT Area:     2.84 cm  RIGHT VENTRICLE RV S prime:     17.50 cm/s TAPSE (M-mode): 2.5 cm LEFT ATRIUM             Index       RIGHT ATRIUM           Index LA diam:        3.00 cm 1.58 cm/m  RA  Area:     16.10 cm LA Vol (A2C):   37.1 ml 19.49 ml/m RA Volume:   40.50 ml  21.27 ml/m LA Vol (A4C):   27.2 ml 14.29 ml/m LA Biplane Vol: 31.6 ml 16.60 ml/m  AORTIC VALVE LVOT Vmax:   104.00 cm/s LVOT Vmean:  64.400 cm/s LVOT VTI:    0.194 m  AORTA Ao Root diam: 3.50 cm MITRAL VALVE MV Area (PHT): 1.78 cm     SHUNTS MV Decel Time: 425 msec     Systemic VTI:  0.19 m MV E velocity: 50.00 cm/s   Systemic Diam: 1.90 cm MV A velocity: 114.00 cm/s MV E/A ratio:  0.44 Rozann Lesches MD Electronically signed by Rozann Lesches MD Signature Date/Time: 07/30/2020/10:33:22 AM    Final     Orson Eva, DO  [facility]  If 7PM-7AM, please contact night-coverage [email or web address] Password [email or web address] 08/08/2020, 5:30 PM   LOS: 1 day

## 2020-08-08 NOTE — ED Notes (Signed)
While pt is sleeping his O2 sats drop to 78% on RA. Pt placed on 2L O2 via [place] while sleeping. Will continue to monitor.

## 2020-08-08 NOTE — ED Notes (Signed)
Pt's O2 sats still dropping to 78% on 2L O2 via [place] while sleeping. Pt reports he has sleep apnea but hasn't been set up with O2 or cpap at night as of yet. Dr. Carles Collet notified.

## 2020-08-08 NOTE — Progress Notes (Signed)
Patient was placed on CPAP due to desaturation earlier in ED into 70s. Patient had been on room air and does not have O2 or a CPAP unit at home. RT placed him on a nasal CPAP with auto settings starting at 5. Patient was very uncomfortable and asked to be removed from the CPAP and placed back on 2LPM [place]. Explained to patient what CPAP was and its purpose. Patient felt he could benefit from a sleep study and possibly a unit once properly fitted after  A sleep study was done.

## 2020-08-09 DIAGNOSIS — J181 Lobar pneumonia, unspecified organism: Secondary | ICD-10-CM | POA: Diagnosis not present

## 2020-08-09 LAB — BASIC METABOLIC PANEL
Anion gap: 6 (ref 5–15)
BUN: 20 mg/dL (ref 8–23)
CO2: 26 mmol/L (ref 22–32)
Calcium: 8.6 mg/dL — ABNORMAL LOW (ref 8.9–10.3)
Chloride: 107 mmol/L (ref 98–111)
Creatinine, Ser: 0.99 mg/dL (ref 0.61–1.24)
GFR, Estimated: >60 mL/min (ref >60)
Glucose, Bld: 99 mg/dL (ref 70–99)
Potassium: 4.1 mmol/L (ref 3.5–5.1)
Sodium: 139 mmol/L (ref 135–145)

## 2020-08-09 LAB — CBC
HCT: 29.8 % — ABNORMAL LOW (ref 39.0–52.0)
Hemoglobin: 9.6 g/dL — ABNORMAL LOW (ref 13.0–17.0)
MCH: 28.8 pg (ref 26.0–34.0)
MCHC: 32.2 g/dL (ref 30.0–36.0)
MCV: 89.5 fL (ref 80.0–100.0)
Platelets: 294 10*3/uL (ref 150–400)
RBC: 3.33 MIL/uL — ABNORMAL LOW (ref 4.22–5.81)
RDW: 14.5 % (ref 11.5–15.5)
WBC: 11.6 10*3/uL — ABNORMAL HIGH (ref 4.0–10.5)
nRBC: 0 % (ref 0.0–0.2)

## 2020-08-09 LAB — MAGNESIUM: Magnesium: 2 mg/dL (ref 1.7–2.4)

## 2020-08-09 MED ORDER — AZITHROMYCIN 500 MG PO TABS: 500.0000 mg | ORAL_TABLET | Freq: Once | ORAL | 0 refills | Status: AC

## 2020-08-09 MED ORDER — CEFDINIR 300 MG PO CAPS: 300.0000 mg | ORAL_CAPSULE | Freq: Two times a day (BID) | ORAL | 0 refills | Status: AC

## 2020-08-09 MED ORDER — CEFDINIR 300 MG PO CAPS: 300.0000 mg | ORAL_CAPSULE | Freq: Two times a day (BID) | ORAL | Status: DC

## 2020-08-09 NOTE — Progress Notes (Signed)
Nsg Discharge Note  Admit Date:  08/07/2020 Discharge date: 08/09/2020   [place] to be D/C'd Home per MD order.  AVS completed.  Copy for chart, and copy for patient signed, and dated. Patient/caregiver able to verbalize understanding. IV removed. Discharge paper work given  Discharge Medication: Allergies as of 08/09/2020      Reactions   Oxycodone Other (See Comments)   States that when he took oxycodone he couldn't move out of the bed.      Medication List    STOP taking these medications   loratadine 10 MG tablet Commonly known as: CLARITIN   multivitamin capsule     TAKE these medications   acetaminophen 500 MG tablet Commonly known as: TYLENOL Take 1,000 mg by mouth every 6 (six) hours as needed.   aspirin 325 MG tablet Take 1 tablet (325 mg total) by mouth daily.   azithromycin 500 MG tablet Commonly known as: ZITHROMAX Take 1 tablet (500 mg total) by mouth once for 1 dose.   cefdinir 300 MG capsule Commonly known as: OMNICEF Take 1 capsule (300 mg total) by mouth every 12 (twelve) hours.   clopidogrel 75 MG tablet Commonly known as: PLAVIX Take 1 tablet (75 mg total) by mouth daily with breakfast.   docusate sodium 100 MG capsule Commonly known as: COLACE Take 100 mg by mouth 2 (two) times daily.   gabapentin 400 MG capsule Commonly known as: NEURONTIN Take 400 mg by mouth 3 (three) times daily.   hydrALAZINE 10 MG tablet Commonly known as: APRESOLINE Take 10 mg by mouth See admin instructions. Take 1/2 tablet by mouth if BP 120 plus standing, or if BP 180 plus sitting. Take 1 tablet by mouth daily as directed.   ibuprofen 200 MG tablet Commonly known as: ADVIL Take 600 mg by mouth daily as needed for moderate pain.   omeprazole 20 MG capsule Commonly known as: PRILOSEC Take 20 mg by mouth 2 (two) times daily before a meal.   pravastatin 40 MG tablet Commonly known as: PRAVACHOL Take 80 mg by mouth daily.   tamsulosin 0.4 MG Caps  capsule Commonly known as: Flomax Take 1 capsule (0.4 mg total) by mouth daily after supper. What changed: when to take this   Vitamin C 500 MG Caps Take 500 mg by mouth daily.   Vitamin D3 50 MCG (2000 UT) Tabs Take 2,000 Units by mouth daily.       Discharge Assessment: Vitals:   08/09/20 0520 08/09/20 0857  BP: 127/71 128/75  Pulse: 64 61  Resp: 20   Temp: 98.2 F (36.8 C)   SpO2: 97%    Skin clean, dry and intact without evidence of skin break down, no evidence of skin tears noted. IV catheter discontinued intact. Site without signs and symptoms of complications - no redness or edema noted at insertion site, patient denies c/o pain - only slight tenderness at site.  Dressing with slight pressure applied.  D/c Instructions-Education: Discharge instructions given to patient/family with verbalized understanding. D/c education completed with patient/family including follow up instructions, medication list, d/c activities limitations if indicated, with other d/c instructions as indicated by MD - patient able to verbalize understanding, all questions fully answered. Patient instructed to return to ED, call 911, or call MD for any changes in condition.  Patient escorted via [place], and D/C home via private auto.  Zenaida Deed, RN 08/09/2020 11:36 AM

## 2020-08-09 NOTE — Discharge Summary (Signed)
Physician Discharge Summary  Kevin Matthews YSA:[id number] DOB: 17-Sep-1934 DOA: 08/07/2020  PCP: [facility]  Admit date: 08/07/2020 Discharge date: 08/09/2020  Admitted From: Home Disposition:  Home   Recommendations for Outpatient Follow-up:  1. Follow up with PCP in 1-2 weeks 2. Please obtain BMP/CBC in one week     Discharge Condition: Stable CODE STATUS: FULL Diet recommendation: Heart Healthy   Brief/Interim Summary:  86 y.o.malewith medical history ofhypertension, hyperlipidemia, GERD, recent stroke presenting with generalized weakness and fever. The patient was recently discharged from the hospital after a stay from 07/29/2020 to 07/31/20 when he was treated for an acute ischemic stroke. CT of the brain on 07/29/2020 showed an acute versus early subacute infarct in the posterior left temporal and occipital lobes. The patient was discharged home with aspirin 325 mg and Plavix 75 mg daily. The patient states that he was gradually feeling better until the morning of 08/07/2020 when he experienced fevers and chills. His wife took his temperature, and it was 100.7 F. He had a feeling of generalized weakness to the point where he needed assistance to get off the bed and into the chair in his living room. The patient had a near syncopal episode when he was getting back from the bathroom to his bedroom which is when he began having this generalized weakness feeling with his fevers and chills. He had denied any headache, neck pain, chest pain, shortness breath, coughing, hemoptysis, nausea, vomiting, diarrhea, abdominal pain, dysuria, hematuria. However he has had polyuria. He denies any new medications besides taking higher dose aspirin. Notably, the patient states that histamsulosin has been increased from once daily to twice daily 2 weeks ago from his [place]. In addition, the patient notes that he has had some abnormality on the right side of his  lung [facility]. Apparently, they told him that he eventually need some type of biopsy. In the emergency department, the patient was afebrile hemodynamically stable with oxygen saturation 96% room air. BMP was essentially unremarkable with serum creatinine at baseline. LFTs were unremarkable. WBC 24.5, hemoglobin 1.1, platelets 240,000. Lactic acid 1.3. Troponins were unremarkable 21>>>21.Chest x-ray showed increasing right upper lobe and right middle lobe opacity. UA was negative for pyuria. EKG showed sinus rhythm with nonspecific ST changes  Discharge Diagnoses:  Lobar pneumonia -continue cefepime and azithromycin -Check procalcitonin <0.10 -Urine Legionella antigen--pending -CT chest--clusters GGO & nodular densities right lung -Follow blood cultures--neg to date -d/c home with cefdinir and azithro x 5 more days -WBC improved -stable on room air  Near syncope/generalized weakness -Likely secondary to infectious process -Check orthostatics--positive -Continued IV fluids -Monitor on telemetry--no concerning dyrhythmia -clinically improved  Recent acute ischemic stroke -Cannot have MRI secondary to spinal stimulator -Continue aspirin and Plavix -Continue statin  Impaired glucose tolerance -Hemoglobin A1c 5.8  Hyperlipidemia -Continue statin  Essential hypertension -Plan to allow permissive hypertension at this point given his orthostasis type symptoms  BPH -Continue tamsulosin  Nocturnal hypoxia -pt desaturates with sleep -will need outpatient polysomnogram  Discharge Instructions   Allergies as of 08/09/2020      Reactions   Oxycodone Other (See Comments)   States that when he took oxycodone he couldn't move out of the bed.      Medication List    STOP taking these medications   loratadine 10 MG tablet Commonly known as: CLARITIN   multivitamin capsule     TAKE these medications   acetaminophen 500 MG  tablet Commonly known as: TYLENOL Take 1,000 mg by mouth every 6 (six) hours as needed.   aspirin 325 MG tablet Take 1 tablet (325 mg total) by mouth daily.   azithromycin 500 MG tablet Commonly known as: ZITHROMAX Take 1 tablet (500 mg total) by mouth once for 1 dose.   cefdinir 300 MG capsule Commonly known as: OMNICEF Take 1 capsule (300 mg total) by mouth every 12 (twelve) hours.   clopidogrel 75 MG tablet Commonly known as: PLAVIX Take 1 tablet (75 mg total) by mouth daily with breakfast.   docusate sodium 100 MG capsule Commonly known as: COLACE Take 100 mg by mouth 2 (two) times daily.   gabapentin 400 MG capsule Commonly known as: NEURONTIN Take 400 mg by mouth 3 (three) times daily.   hydrALAZINE 10 MG tablet Commonly known as: APRESOLINE Take 10 mg by mouth See admin instructions. Take 1/2 tablet by mouth if BP 120 plus standing, or if BP 180 plus sitting. Take 1 tablet by mouth daily as directed.   ibuprofen 200 MG tablet Commonly known as: ADVIL Take 600 mg by mouth daily as needed for moderate pain.   omeprazole 20 MG capsule Commonly known as: PRILOSEC Take 20 mg by mouth 2 (two) times daily before a meal.   pravastatin 40 MG tablet Commonly known as: PRAVACHOL Take 80 mg by mouth daily.   tamsulosin 0.4 MG Caps capsule Commonly known as: Flomax Take 1 capsule (0.4 mg total) by mouth daily after supper. What changed: when to take this   Vitamin C 500 MG Caps Take 500 mg by mouth daily.   Vitamin D3 50 MCG (2000 UT) Tabs Take 2,000 Units by mouth daily.       Allergies  Allergen Reactions  . Oxycodone Other (See Comments)    States that when he took oxycodone he couldn't move out of the bed.    Consultations:  none   Procedures/Studies: CT ANGIO HEAD W OR WO CONTRAST  Result Date: 07/29/2020 CLINICAL DATA:  Blurry vision. EXAM: CT ANGIOGRAPHY HEAD AND NECK TECHNIQUE: Multidetector CT imaging of the head and neck was performed using  the standard protocol during bolus administration of intravenous contrast. Multiplanar CT image reconstructions and MIPs were obtained to evaluate the vascular anatomy. Carotid stenosis measurements (when applicable) are obtained utilizing NASCET criteria, using the distal internal carotid diameter as the denominator. CONTRAST:  180mL OMNIPAQUE IOHEXOL 350 MG/ML SOLN COMPARISON:  None. FINDINGS: CTA NECK FINDINGS SKELETON: There is no bony spinal canal stenosis. No lytic or blastic lesion. OTHER NECK: Normal pharynx, larynx and major salivary glands. No cervical lymphadenopathy. Unremarkable thyroid gland. UPPER CHEST: No pneumothorax or pleural effusion. No nodules or masses. AORTIC ARCH: There is mild calcific atherosclerosis of the aortic arch. There is no aneurysm, dissection or hemodynamically significant stenosis of the visualized portion of the aorta. Conventional 3 vessel aortic branching pattern. The visualized proximal subclavian arteries are widely patent. RIGHT CAROTID SYSTEM: Normal without aneurysm, dissection or stenosis. LEFT CAROTID SYSTEM: Normal without aneurysm, dissection or stenosis. VERTEBRAL ARTERIES: Left dominant configuration. Both origins are clearly patent. There is no dissection, occlusion or flow-limiting stenosis to the skull base (V1-V3 segments). CTA HEAD FINDINGS POSTERIOR CIRCULATION: --Vertebral arteries: Normal V4 segments. --Inferior cerebellar arteries: Normal. --Basilar artery: Normal. --Superior cerebellar arteries: Normal. --Posterior cerebral arteries (PCA): Normal. ANTERIOR CIRCULATION: --Intracranial internal carotid arteries: Atherosclerotic calcification of the internal carotid arteries at the skull base without hemodynamically significant stenosis. --Anterior cerebral arteries (ACA): Normal. Both A1  segments are present. Patent anterior communicating artery (a-comm). --Middle cerebral arteries (MCA): Normal. VENOUS SINUSES: As permitted by contrast timing, patent.  ANATOMIC VARIANTS: None Review of the MIP images confirms the above findings. IMPRESSION: No emergent large vessel occlusion or high-grade stenosis of the intracranial or cervical arteries. Aortic atherosclerosis (ICD10-I70.0). Electronically Signed   By: Ulyses Jarred M.D.   On: 07/29/2020 23:10   DG Chest 2 View  Result Date: 07/29/2020 CLINICAL DATA:  Blurry vision. EXAM: CHEST - 2 VIEW COMPARISON:  January 14, 2014 FINDINGS: Mild areas of atelectasis and/or infiltrate are seen within the mid right lung and right lung base. There is no evidence of a pleural effusion or pneumothorax. The heart size and mediastinal contours are within normal limits. There is mild calcification of the aortic arch. A radiopaque fusion plate and screws are seen overlying the lower cervical spine. Mild to moderate severity dextroscoliosis of the upper thoracic spine is noted. IMPRESSION: Mild mid right lung and right basilar atelectasis and/or infiltrate. Electronically Signed   By: Virgina Norfolk M.D.   On: 07/29/2020 23:06   CT HEAD WO CONTRAST  Result Date: 07/29/2020 CLINICAL DATA:  Possible TIA versus stroke. Weakness. Difficulty discerning words. EXAM: CT HEAD WITHOUT CONTRAST TECHNIQUE: Contiguous axial images were obtained from the base of the skull through the vertex without intravenous contrast. COMPARISON:  CT head January 13, 2014 FINDINGS: Brain: There is a new area of cytotoxic edema and loss of gray-white differentiation involving the posterior left temporal lobe and lateral occipital lobe, compatible with acute or early subacute infarct. Mild local mass effect with sulcal effacement. No midline shift. Basal cisterns are patent. No acute hemorrhage. Similar focal hypodensity in the inferior left basal ganglia, likely a dilated perivascular space. No hydrocephalus. Vascular: No hyperdense vessel identified. Calcific atherosclerosis. Skull: Normal. Negative for fracture or focal lesion. Sinuses/Orbits: Visualized  sinuses are clear. No acute orbital abnormality. Other: No mastoid effusion. IMPRESSION: Acute or early subacute infarct in the posterior left temporal and lateral occipital lobe. Sulcal effacement without midline shift. No evidence of hemorrhagic transformation. Consider MRI for further evaluation. These findings were discussed with Dr. Eulis Foster at 4:20 p.m. via telephone. Electronically Signed   By: Margaretha Sheffield MD   On: 07/29/2020 16:23   CT HEAD W CONTRAST  Result Date: 07/30/2020 CLINICAL DATA:  86 year old male presenting with weakness and abnormal speech. Noncontrast head CT yesterday with acute to subacute appearing cortical infarct in the posterior hemisphere at the left PCA/MCA watershed. Not a candidate for MRI due to spinal stimulator. EXAM: CT HEAD WITH CONTRAST TECHNIQUE: Contiguous axial images were obtained from the base of the skull through the vertex with intravenous contrast. CONTRAST:  174mL OMNIPAQUE IOHEXOL 300 MG/ML  SOLN COMPARISON:  Head CT, CTA head and neck yesterday. FINDINGS: Brain: Stable appearance of cytotoxic edema at the junction of the lateral left occipital and posterior left temporal lobes. Mild luxury perfusion related enhancement. No evidence of associated hemorrhage on this post-contrast study. No significant mass effect. Stable gray-white matter differentiation elsewhere. No suspicious intracranial enhancement. No ventriculomegaly. Vascular: Calcified atherosclerosis at the skull base. Major intracranial vascular structures are enhancing as expected, including the proximal left PCA and the major dural venous sinuses. Skull: No acute osseous abnormality identified. Sinuses/Orbits: Visualized paranasal sinuses and mastoids are stable and well pneumatized. Other: No acute orbit or scalp soft tissue findings. IMPRESSION: 1. Stable posterior left cerebral infarct since yesterday. No hemorrhagic transformation or mass effect. 2. No new intracranial abnormality.  Electronically  Signed   By: Genevie Ann M.D.   On: 07/30/2020 22:03   CT ANGIO NECK W OR WO CONTRAST  Result Date: 07/29/2020 CLINICAL DATA:  Blurry vision. EXAM: CT ANGIOGRAPHY HEAD AND NECK TECHNIQUE: Multidetector CT imaging of the head and neck was performed using the standard protocol during bolus administration of intravenous contrast. Multiplanar CT image reconstructions and MIPs were obtained to evaluate the vascular anatomy. Carotid stenosis measurements (when applicable) are obtained utilizing NASCET criteria, using the distal internal carotid diameter as the denominator. CONTRAST:  168mL OMNIPAQUE IOHEXOL 350 MG/ML SOLN COMPARISON:  None. FINDINGS: CTA NECK FINDINGS SKELETON: There is no bony spinal canal stenosis. No lytic or blastic lesion. OTHER NECK: Normal pharynx, larynx and major salivary glands. No cervical lymphadenopathy. Unremarkable thyroid gland. UPPER CHEST: No pneumothorax or pleural effusion. No nodules or masses. AORTIC ARCH: There is mild calcific atherosclerosis of the aortic arch. There is no aneurysm, dissection or hemodynamically significant stenosis of the visualized portion of the aorta. Conventional 3 vessel aortic branching pattern. The visualized proximal subclavian arteries are widely patent. RIGHT CAROTID SYSTEM: Normal without aneurysm, dissection or stenosis. LEFT CAROTID SYSTEM: Normal without aneurysm, dissection or stenosis. VERTEBRAL ARTERIES: Left dominant configuration. Both origins are clearly patent. There is no dissection, occlusion or flow-limiting stenosis to the skull base (V1-V3 segments). CTA HEAD FINDINGS POSTERIOR CIRCULATION: --Vertebral arteries: Normal V4 segments. --Inferior cerebellar arteries: Normal. --Basilar artery: Normal. --Superior cerebellar arteries: Normal. --Posterior cerebral arteries (PCA): Normal. ANTERIOR CIRCULATION: --Intracranial internal carotid arteries: Atherosclerotic calcification of the internal carotid arteries at the skull base without  hemodynamically significant stenosis. --Anterior cerebral arteries (ACA): Normal. Both A1 segments are present. Patent anterior communicating artery (a-comm). --Middle cerebral arteries (MCA): Normal. VENOUS SINUSES: As permitted by contrast timing, patent. ANATOMIC VARIANTS: None Review of the MIP images confirms the above findings. IMPRESSION: No emergent large vessel occlusion or high-grade stenosis of the intracranial or cervical arteries. Aortic atherosclerosis (ICD10-I70.0). Electronically Signed   By: Ulyses Jarred M.D.   On: 07/29/2020 23:10   CT CHEST W CONTRAST  Result Date: 08/07/2020 CLINICAL DATA:  86 year old male with pneumonia. EXAM: CT CHEST WITH CONTRAST TECHNIQUE: Multidetector CT imaging of the chest was performed during intravenous contrast administration. CONTRAST:  14mL OMNIPAQUE IOHEXOL 300 MG/ML  SOLN COMPARISON:  Chest radiograph dated 08/07/2020. FINDINGS: Cardiovascular: There is no cardiomegaly or pericardial effusion. Advanced 3 vessel coronary vascular calcification. There is advanced atherosclerotic calcification of the thoracic aorta. No aneurysmal dilatation or dissection. The origins of the great vessels of the aortic arch appear patent. The central pulmonary arteries are unremarkable. Mediastinum/Nodes: Mildly enlarged right paratracheal lymph nodes, likely reactive. The esophagus is grossly unremarkable. No mediastinal fluid collection. Lungs/Pleura: Clusters of ground-glass and nodular densities primarily in the right lung and right upper lobe most consistent with multifocal pneumonia, likely viral or atypical in etiology. Aspiration is not excluded. Clinical correlation is recommended. There is no pleural effusion pneumothorax. The central airways are patent. Upper Abdomen: No acute abnormality. Musculoskeletal: Degenerative changes of the spine. No acute osseous pathology. Thoracic spinal stimulator wire. Lower cervical ACDF. IMPRESSION: Multifocal pneumonia, likely viral  or atypical in etiology. Aspiration is not excluded. Clinical correlation is recommended. Electronically Signed   By: Anner Crete M.D.   On: 08/07/2020 22:24   US Carotid Bilateral (at [facility] and [facility] only)  Result Date: 07/30/2020 CLINICAL DATA:  Stroke symptoms, carotid atherosclerosis, previous left carotid endarterectomy EXAM: BILATERAL CAROTID DUPLEX ULTRASOUND TECHNIQUE: Pearline Cables scale imaging, color Doppler  and duplex ultrasound were performed of bilateral carotid and vertebral arteries in the neck. COMPARISON:  07/29/2020 FINDINGS: Criteria: Quantification of carotid stenosis is based on velocity parameters that correlate the residual internal carotid diameter with NASCET-based stenosis levels, using the diameter of the distal internal carotid lumen as the denominator for stenosis measurement. The following velocity measurements were obtained: RIGHT ICA: 233/47 cm/sec CCA: 25/95 cm/sec SYSTOLIC ICA/CCA RATIO:  4.6 ECA: 87 cm/sec LEFT ICA: 122/41 cm/sec CCA: 63/87 cm/sec SYSTOLIC ICA/CCA RATIO:  2.5 ECA: 60 cm/sec RIGHT CAROTID ARTERY: Heterogeneous irregular calcified right carotid bifurcation atherosclerosis. Proximal ICA velocity elevation measures up to 233/47 centimeters/second with spectral broadening and mild turbulent flow. Right ICA stenosis estimated at greater than 70% by ultrasound criteria. RIGHT VERTEBRAL ARTERY:  Normal antegrade flow LEFT CAROTID ARTERY: Similar scattered minor echogenic plaque formation. No hemodynamically significant left ICA stenosis, velocity elevation, or turbulent flow. LEFT VERTEBRAL ARTERY:  Normal antegrade flow IMPRESSION: Right ICA stenosis estimated at greater than 70% by ultrasound criteria. Left ICA narrowing less than 50% Normal antegrade vertebral flow bilaterally Electronically Signed   By: Jerilynn Mages.  Shick M.D.   On: 07/30/2020 10:33   DG Chest Port 1 View  Result Date: 08/07/2020 CLINICAL DATA:  Syncopal episode. EXAM: PORTABLE CHEST 1 VIEW COMPARISON:  Chest  radiograph July 29, 2020. FINDINGS: Monitoring leads overlie the patient. Stable cardiac and mediastinal contours. Redemonstrated and slightly increased patchy consolidative opacities within the right mid and upper lung. No pleural effusion or pneumothorax. IMPRESSION: Increased airspace opacities within the right mid and upper lung may represent pneumonia in the appropriate clinical setting. Followup PA and lateral chest X-ray is recommended in 3-4 weeks following trial of antibiotic therapy to ensure resolution and exclude underlying malignancy. Electronically Signed   By: Lovey Newcomer M.D.   On: 08/07/2020 13:19   ECHOCARDIOGRAM COMPLETE  Result Date: 07/30/2020    ECHOCARDIOGRAM REPORT   Patient Name:   Kevin Matthews Date of Exam: 07/30/2020 Medical Rec #:  [id number]         Height:       69.0 in Accession #:    [id number]        Weight:       165.0 lb Date of Birth:  1933/12/23         BSA:          1.904 m Patient Age:    53 years          BP:           158/101 mmHg Patient Gender: M                 HR:           71 bpm. Exam Location:  [facility] Procedure: 2D Echo, Cardiac Doppler and Color Doppler Indications:    Stroke 434.91 / I163.9  History:        Patient has no prior history of Echocardiogram examinations.                 Risk Factors:Hypertension and Dyslipidemia.  Sonographer:    Alvino Chapel RCS Referring Phys: [place]  1. Left ventricular ejection fraction, by estimation, is 55 to 60%. The left ventricle has normal function. The left ventricle has no regional wall motion abnormalities. Left ventricular diastolic parameters are indeterminate.  2. Right ventricular systolic function is normal. The right ventricular size is normal. Tricuspid regurgitation signal is inadequate for assessing PA pressure.  3.  The mitral valve is grossly normal. Mild mitral valve regurgitation.  4. The aortic valve is tricuspid. There is moderate calcification of the aortic valve.  Aortic valve regurgitation is not visualized. Mild to moderate aortic valve sclerosis/calcification is present, without any evidence of aortic stenosis.  5. The inferior vena cava is normal in size with greater than 50% respiratory variability, suggesting right atrial pressure of 3 mmHg. FINDINGS  Left Ventricle: Left ventricular ejection fraction, by estimation, is 55 to 60%. The left ventricle has normal function. The left ventricle has no regional wall motion abnormalities. The left ventricular internal cavity size was normal in size. There is  no left ventricular hypertrophy. Left ventricular diastolic parameters are indeterminate. Right Ventricle: The right ventricular size is normal. No increase in right ventricular wall thickness. Right ventricular systolic function is normal. Tricuspid regurgitation signal is inadequate for assessing PA pressure. Left Atrium: Left atrial size was normal in size. Right Atrium: Right atrial size was normal in size. Pericardium: There is no evidence of pericardial effusion. Presence of pericardial fat pad. Mitral Valve: The mitral valve is grossly normal. Mild mitral valve regurgitation. Tricuspid Valve: The tricuspid valve is grossly normal. Tricuspid valve regurgitation is trivial. Aortic Valve: The aortic valve is tricuspid. There is moderate calcification of the aortic valve. There is moderate aortic valve annular calcification. Aortic valve regurgitation is not visualized. Mild to moderate aortic valve sclerosis/calcification is  present, without any evidence of aortic stenosis. Pulmonic Valve: The pulmonic valve was grossly normal. Pulmonic valve regurgitation is trivial. Aorta: The aortic root is normal in size and structure. Venous: The inferior vena cava is normal in size with greater than 50% respiratory variability, suggesting right atrial pressure of 3 mmHg. IAS/Shunts: No atrial level shunt detected by color flow Doppler.  LEFT VENTRICLE PLAX 2D LVIDd:         4.82  cm  Diastology LVIDs:         3.58 cm  LV e' medial:    3.70 cm/s LV PW:         0.95 cm  LV E/e' medial:  13.5 LV IVS:        1.07 cm  LV e' lateral:   5.44 cm/s LVOT diam:     1.90 cm  LV E/e' lateral: 9.2 LV SV:         55 LV SV Index:   29 LVOT Area:     2.84 cm  RIGHT VENTRICLE RV S prime:     17.50 cm/s TAPSE (M-mode): 2.5 cm LEFT ATRIUM             Index       RIGHT ATRIUM           Index LA diam:        3.00 cm 1.58 cm/m  RA Area:     16.10 cm LA Vol (A2C):   37.1 ml 19.49 ml/m RA Volume:   40.50 ml  21.27 ml/m LA Vol (A4C):   27.2 ml 14.29 ml/m LA Biplane Vol: 31.6 ml 16.60 ml/m  AORTIC VALVE LVOT Vmax:   104.00 cm/s LVOT Vmean:  64.400 cm/s LVOT VTI:    0.194 m  AORTA Ao Root diam: 3.50 cm MITRAL VALVE MV Area (PHT): 1.78 cm     SHUNTS MV Decel Time: 425 msec     Systemic VTI:  0.19 m MV E velocity: 50.00 cm/s   Systemic Diam: 1.90 cm MV A velocity: 114.00 cm/s MV E/A ratio:  0.44  Rozann Lesches MD Electronically signed by Rozann Lesches MD Signature Date/Time: 07/30/2020/10:33:22 AM    Final          Discharge Exam: Vitals:   08/09/20 0520 08/09/20 0857  BP: 127/71 128/75  Pulse: 64 61  Resp: 20   Temp: 98.2 F (36.8 C)   SpO2: 97%    Vitals:   08/08/20 2126 08/08/20 2300 08/09/20 0520 08/09/20 0857  BP: 117/69  127/71 128/75  Pulse: 63  64 61  Resp: 18  20   Temp: 98.6 F (37 C)  98.2 F (36.8 C)   TempSrc: Oral  Oral   SpO2: 90% 98% 97%   Weight:      Height:        General: Pt is alert, awake, not in acute distress Cardiovascular: RRR, S1/S2 +, no rubs, no gallops Respiratory: bibasilar rales. No wheeze Abdominal: Soft, NT, ND, bowel sounds + Extremities: no edema, no cyanosis   The results of significant diagnostics from this hospitalization (including imaging, microbiology, ancillary and laboratory) are listed below for reference.    Significant Diagnostic Studies: CT ANGIO HEAD W OR WO CONTRAST  Result Date: 07/29/2020 CLINICAL DATA:  Blurry  vision. EXAM: CT ANGIOGRAPHY HEAD AND NECK TECHNIQUE: Multidetector CT imaging of the head and neck was performed using the standard protocol during bolus administration of intravenous contrast. Multiplanar CT image reconstructions and MIPs were obtained to evaluate the vascular anatomy. Carotid stenosis measurements (when applicable) are obtained utilizing NASCET criteria, using the distal internal carotid diameter as the denominator. CONTRAST:  163mL OMNIPAQUE IOHEXOL 350 MG/ML SOLN COMPARISON:  None. FINDINGS: CTA NECK FINDINGS SKELETON: There is no bony spinal canal stenosis. No lytic or blastic lesion. OTHER NECK: Normal pharynx, larynx and major salivary glands. No cervical lymphadenopathy. Unremarkable thyroid gland. UPPER CHEST: No pneumothorax or pleural effusion. No nodules or masses. AORTIC ARCH: There is mild calcific atherosclerosis of the aortic arch. There is no aneurysm, dissection or hemodynamically significant stenosis of the visualized portion of the aorta. Conventional 3 vessel aortic branching pattern. The visualized proximal subclavian arteries are widely patent. RIGHT CAROTID SYSTEM: Normal without aneurysm, dissection or stenosis. LEFT CAROTID SYSTEM: Normal without aneurysm, dissection or stenosis. VERTEBRAL ARTERIES: Left dominant configuration. Both origins are clearly patent. There is no dissection, occlusion or flow-limiting stenosis to the skull base (V1-V3 segments). CTA HEAD FINDINGS POSTERIOR CIRCULATION: --Vertebral arteries: Normal V4 segments. --Inferior cerebellar arteries: Normal. --Basilar artery: Normal. --Superior cerebellar arteries: Normal. --Posterior cerebral arteries (PCA): Normal. ANTERIOR CIRCULATION: --Intracranial internal carotid arteries: Atherosclerotic calcification of the internal carotid arteries at the skull base without hemodynamically significant stenosis. --Anterior cerebral arteries (ACA): Normal. Both A1 segments are present. Patent anterior communicating  artery (a-comm). --Middle cerebral arteries (MCA): Normal. VENOUS SINUSES: As permitted by contrast timing, patent. ANATOMIC VARIANTS: None Review of the MIP images confirms the above findings. IMPRESSION: No emergent large vessel occlusion or high-grade stenosis of the intracranial or cervical arteries. Aortic atherosclerosis (ICD10-I70.0). Electronically Signed   By: Ulyses Jarred M.D.   On: 07/29/2020 23:10   DG Chest 2 View  Result Date: 07/29/2020 CLINICAL DATA:  Blurry vision. EXAM: CHEST - 2 VIEW COMPARISON:  January 14, 2014 FINDINGS: Mild areas of atelectasis and/or infiltrate are seen within the mid right lung and right lung base. There is no evidence of a pleural effusion or pneumothorax. The heart size and mediastinal contours are within normal limits. There is mild calcification of the aortic arch. A radiopaque fusion plate and screws are  seen overlying the lower cervical spine. Mild to moderate severity dextroscoliosis of the upper thoracic spine is noted. IMPRESSION: Mild mid right lung and right basilar atelectasis and/or infiltrate. Electronically Signed   By: Virgina Norfolk M.D.   On: 07/29/2020 23:06   CT HEAD WO CONTRAST  Result Date: 07/29/2020 CLINICAL DATA:  Possible TIA versus stroke. Weakness. Difficulty discerning words. EXAM: CT HEAD WITHOUT CONTRAST TECHNIQUE: Contiguous axial images were obtained from the base of the skull through the vertex without intravenous contrast. COMPARISON:  CT head January 13, 2014 FINDINGS: Brain: There is a new area of cytotoxic edema and loss of gray-white differentiation involving the posterior left temporal lobe and lateral occipital lobe, compatible with acute or early subacute infarct. Mild local mass effect with sulcal effacement. No midline shift. Basal cisterns are patent. No acute hemorrhage. Similar focal hypodensity in the inferior left basal ganglia, likely a dilated perivascular space. No hydrocephalus. Vascular: No hyperdense vessel  identified. Calcific atherosclerosis. Skull: Normal. Negative for fracture or focal lesion. Sinuses/Orbits: Visualized sinuses are clear. No acute orbital abnormality. Other: No mastoid effusion. IMPRESSION: Acute or early subacute infarct in the posterior left temporal and lateral occipital lobe. Sulcal effacement without midline shift. No evidence of hemorrhagic transformation. Consider MRI for further evaluation. These findings were discussed with Dr. Eulis Foster at 4:20 p.m. via telephone. Electronically Signed   By: Margaretha Sheffield MD   On: 07/29/2020 16:23   CT HEAD W CONTRAST  Result Date: 07/30/2020 CLINICAL DATA:  86 year old male presenting with weakness and abnormal speech. Noncontrast head CT yesterday with acute to subacute appearing cortical infarct in the posterior hemisphere at the left PCA/MCA watershed. Not a candidate for MRI due to spinal stimulator. EXAM: CT HEAD WITH CONTRAST TECHNIQUE: Contiguous axial images were obtained from the base of the skull through the vertex with intravenous contrast. CONTRAST:  143mL OMNIPAQUE IOHEXOL 300 MG/ML  SOLN COMPARISON:  Head CT, CTA head and neck yesterday. FINDINGS: Brain: Stable appearance of cytotoxic edema at the junction of the lateral left occipital and posterior left temporal lobes. Mild luxury perfusion related enhancement. No evidence of associated hemorrhage on this post-contrast study. No significant mass effect. Stable gray-white matter differentiation elsewhere. No suspicious intracranial enhancement. No ventriculomegaly. Vascular: Calcified atherosclerosis at the skull base. Major intracranial vascular structures are enhancing as expected, including the proximal left PCA and the major dural venous sinuses. Skull: No acute osseous abnormality identified. Sinuses/Orbits: Visualized paranasal sinuses and mastoids are stable and well pneumatized. Other: No acute orbit or scalp soft tissue findings. IMPRESSION: 1. Stable posterior left cerebral  infarct since yesterday. No hemorrhagic transformation or mass effect. 2. No new intracranial abnormality. Electronically Signed   By: Genevie Ann M.D.   On: 07/30/2020 22:03   CT ANGIO NECK W OR WO CONTRAST  Result Date: 07/29/2020 CLINICAL DATA:  Blurry vision. EXAM: CT ANGIOGRAPHY HEAD AND NECK TECHNIQUE: Multidetector CT imaging of the head and neck was performed using the standard protocol during bolus administration of intravenous contrast. Multiplanar CT image reconstructions and MIPs were obtained to evaluate the vascular anatomy. Carotid stenosis measurements (when applicable) are obtained utilizing NASCET criteria, using the distal internal carotid diameter as the denominator. CONTRAST:  173mL OMNIPAQUE IOHEXOL 350 MG/ML SOLN COMPARISON:  None. FINDINGS: CTA NECK FINDINGS SKELETON: There is no bony spinal canal stenosis. No lytic or blastic lesion. OTHER NECK: Normal pharynx, larynx and major salivary glands. No cervical lymphadenopathy. Unremarkable thyroid gland. UPPER CHEST: No pneumothorax or pleural effusion. No nodules  or masses. AORTIC ARCH: There is mild calcific atherosclerosis of the aortic arch. There is no aneurysm, dissection or hemodynamically significant stenosis of the visualized portion of the aorta. Conventional 3 vessel aortic branching pattern. The visualized proximal subclavian arteries are widely patent. RIGHT CAROTID SYSTEM: Normal without aneurysm, dissection or stenosis. LEFT CAROTID SYSTEM: Normal without aneurysm, dissection or stenosis. VERTEBRAL ARTERIES: Left dominant configuration. Both origins are clearly patent. There is no dissection, occlusion or flow-limiting stenosis to the skull base (V1-V3 segments). CTA HEAD FINDINGS POSTERIOR CIRCULATION: --Vertebral arteries: Normal V4 segments. --Inferior cerebellar arteries: Normal. --Basilar artery: Normal. --Superior cerebellar arteries: Normal. --Posterior cerebral arteries (PCA): Normal. ANTERIOR CIRCULATION: --Intracranial  internal carotid arteries: Atherosclerotic calcification of the internal carotid arteries at the skull base without hemodynamically significant stenosis. --Anterior cerebral arteries (ACA): Normal. Both A1 segments are present. Patent anterior communicating artery (a-comm). --Middle cerebral arteries (MCA): Normal. VENOUS SINUSES: As permitted by contrast timing, patent. ANATOMIC VARIANTS: None Review of the MIP images confirms the above findings. IMPRESSION: No emergent large vessel occlusion or high-grade stenosis of the intracranial or cervical arteries. Aortic atherosclerosis (ICD10-I70.0). Electronically Signed   By: Ulyses Jarred M.D.   On: 07/29/2020 23:10   CT CHEST W CONTRAST  Result Date: 08/07/2020 CLINICAL DATA:  86 year old male with pneumonia. EXAM: CT CHEST WITH CONTRAST TECHNIQUE: Multidetector CT imaging of the chest was performed during intravenous contrast administration. CONTRAST:  43mL OMNIPAQUE IOHEXOL 300 MG/ML  SOLN COMPARISON:  Chest radiograph dated 08/07/2020. FINDINGS: Cardiovascular: There is no cardiomegaly or pericardial effusion. Advanced 3 vessel coronary vascular calcification. There is advanced atherosclerotic calcification of the thoracic aorta. No aneurysmal dilatation or dissection. The origins of the great vessels of the aortic arch appear patent. The central pulmonary arteries are unremarkable. Mediastinum/Nodes: Mildly enlarged right paratracheal lymph nodes, likely reactive. The esophagus is grossly unremarkable. No mediastinal fluid collection. Lungs/Pleura: Clusters of ground-glass and nodular densities primarily in the right lung and right upper lobe most consistent with multifocal pneumonia, likely viral or atypical in etiology. Aspiration is not excluded. Clinical correlation is recommended. There is no pleural effusion pneumothorax. The central airways are patent. Upper Abdomen: No acute abnormality. Musculoskeletal: Degenerative changes of the spine. No acute  osseous pathology. Thoracic spinal stimulator wire. Lower cervical ACDF. IMPRESSION: Multifocal pneumonia, likely viral or atypical in etiology. Aspiration is not excluded. Clinical correlation is recommended. Electronically Signed   By: Anner Crete M.D.   On: 08/07/2020 22:24   US Carotid Bilateral (at [facility] and [facility] only)  Result Date: 07/30/2020 CLINICAL DATA:  Stroke symptoms, carotid atherosclerosis, previous left carotid endarterectomy EXAM: BILATERAL CAROTID DUPLEX ULTRASOUND TECHNIQUE: Pearline Cables scale imaging, color Doppler and duplex ultrasound were performed of bilateral carotid and vertebral arteries in the neck. COMPARISON:  07/29/2020 FINDINGS: Criteria: Quantification of carotid stenosis is based on velocity parameters that correlate the residual internal carotid diameter with NASCET-based stenosis levels, using the diameter of the distal internal carotid lumen as the denominator for stenosis measurement. The following velocity measurements were obtained: RIGHT ICA: 233/47 cm/sec CCA: 14/78 cm/sec SYSTOLIC ICA/CCA RATIO:  4.6 ECA: 87 cm/sec LEFT ICA: 122/41 cm/sec CCA: 29/56 cm/sec SYSTOLIC ICA/CCA RATIO:  2.5 ECA: 60 cm/sec RIGHT CAROTID ARTERY: Heterogeneous irregular calcified right carotid bifurcation atherosclerosis. Proximal ICA velocity elevation measures up to 233/47 centimeters/second with spectral broadening and mild turbulent flow. Right ICA stenosis estimated at greater than 70% by ultrasound criteria. RIGHT VERTEBRAL ARTERY:  Normal antegrade flow LEFT CAROTID ARTERY: Similar scattered minor echogenic plaque formation. No  hemodynamically significant left ICA stenosis, velocity elevation, or turbulent flow. LEFT VERTEBRAL ARTERY:  Normal antegrade flow IMPRESSION: Right ICA stenosis estimated at greater than 70% by ultrasound criteria. Left ICA narrowing less than 50% Normal antegrade vertebral flow bilaterally Electronically Signed   By: Jerilynn Mages.  Shick M.D.   On: 07/30/2020 10:33   DG Chest  Port 1 View  Result Date: 08/07/2020 CLINICAL DATA:  Syncopal episode. EXAM: PORTABLE CHEST 1 VIEW COMPARISON:  Chest radiograph July 29, 2020. FINDINGS: Monitoring leads overlie the patient. Stable cardiac and mediastinal contours. Redemonstrated and slightly increased patchy consolidative opacities within the right mid and upper lung. No pleural effusion or pneumothorax. IMPRESSION: Increased airspace opacities within the right mid and upper lung may represent pneumonia in the appropriate clinical setting. Followup PA and lateral chest X-ray is recommended in 3-4 weeks following trial of antibiotic therapy to ensure resolution and exclude underlying malignancy. Electronically Signed   By: Lovey Newcomer M.D.   On: 08/07/2020 13:19   ECHOCARDIOGRAM COMPLETE  Result Date: 07/30/2020    ECHOCARDIOGRAM REPORT   Patient Name:   Kevin Matthews Date of Exam: 07/30/2020 Medical Rec #:  [id number]         Height:       69.0 in Accession #:    [id number]        Weight:       165.0 lb Date of Birth:  1934/04/20         BSA:          1.904 m Patient Age:    51 years          BP:           158/101 mmHg Patient Gender: M                 HR:           71 bpm. Exam Location:  [facility] Procedure: 2D Echo, Cardiac Doppler and Color Doppler Indications:    Stroke 434.91 / I163.9  History:        Patient has no prior history of Echocardiogram examinations.                 Risk Factors:Hypertension and Dyslipidemia.  Sonographer:    Alvino Chapel RCS Referring Phys: [place]  1. Left ventricular ejection fraction, by estimation, is 55 to 60%. The left ventricle has normal function. The left ventricle has no regional wall motion abnormalities. Left ventricular diastolic parameters are indeterminate.  2. Right ventricular systolic function is normal. The right ventricular size is normal. Tricuspid regurgitation signal is inadequate for assessing PA pressure.  3. The mitral valve is grossly normal.  Mild mitral valve regurgitation.  4. The aortic valve is tricuspid. There is moderate calcification of the aortic valve. Aortic valve regurgitation is not visualized. Mild to moderate aortic valve sclerosis/calcification is present, without any evidence of aortic stenosis.  5. The inferior vena cava is normal in size with greater than 50% respiratory variability, suggesting right atrial pressure of 3 mmHg. FINDINGS  Left Ventricle: Left ventricular ejection fraction, by estimation, is 55 to 60%. The left ventricle has normal function. The left ventricle has no regional wall motion abnormalities. The left ventricular internal cavity size was normal in size. There is  no left ventricular hypertrophy. Left ventricular diastolic parameters are indeterminate. Right Ventricle: The right ventricular size is normal. No increase in right ventricular wall thickness. Right ventricular systolic function is normal. Tricuspid regurgitation  signal is inadequate for assessing PA pressure. Left Atrium: Left atrial size was normal in size. Right Atrium: Right atrial size was normal in size. Pericardium: There is no evidence of pericardial effusion. Presence of pericardial fat pad. Mitral Valve: The mitral valve is grossly normal. Mild mitral valve regurgitation. Tricuspid Valve: The tricuspid valve is grossly normal. Tricuspid valve regurgitation is trivial. Aortic Valve: The aortic valve is tricuspid. There is moderate calcification of the aortic valve. There is moderate aortic valve annular calcification. Aortic valve regurgitation is not visualized. Mild to moderate aortic valve sclerosis/calcification is  present, without any evidence of aortic stenosis. Pulmonic Valve: The pulmonic valve was grossly normal. Pulmonic valve regurgitation is trivial. Aorta: The aortic root is normal in size and structure. Venous: The inferior vena cava is normal in size with greater than 50% respiratory variability, suggesting right atrial pressure  of 3 mmHg. IAS/Shunts: No atrial level shunt detected by color flow Doppler.  LEFT VENTRICLE PLAX 2D LVIDd:         4.82 cm  Diastology LVIDs:         3.58 cm  LV e' medial:    3.70 cm/s LV PW:         0.95 cm  LV E/e' medial:  13.5 LV IVS:        1.07 cm  LV e' lateral:   5.44 cm/s LVOT diam:     1.90 cm  LV E/e' lateral: 9.2 LV SV:         55 LV SV Index:   29 LVOT Area:     2.84 cm  RIGHT VENTRICLE RV S prime:     17.50 cm/s TAPSE (M-mode): 2.5 cm LEFT ATRIUM             Index       RIGHT ATRIUM           Index LA diam:        3.00 cm 1.58 cm/m  RA Area:     16.10 cm LA Vol (A2C):   37.1 ml 19.49 ml/m RA Volume:   40.50 ml  21.27 ml/m LA Vol (A4C):   27.2 ml 14.29 ml/m LA Biplane Vol: 31.6 ml 16.60 ml/m  AORTIC VALVE LVOT Vmax:   104.00 cm/s LVOT Vmean:  64.400 cm/s LVOT VTI:    0.194 m  AORTA Ao Root diam: 3.50 cm MITRAL VALVE MV Area (PHT): 1.78 cm     SHUNTS MV Decel Time: 425 msec     Systemic VTI:  0.19 m MV E velocity: 50.00 cm/s   Systemic Diam: 1.90 cm MV A velocity: 114.00 cm/s MV E/A ratio:  0.44 Rozann Lesches MD Electronically signed by Rozann Lesches MD Signature Date/Time: 07/30/2020/10:33:22 AM    Final      Microbiology: Recent Results (from the past 240 hour(s))  Respiratory Panel by RT PCR (Flu A&B, Covid) - Nasopharyngeal Swab     Status: None   Collection Time: 08/07/20 12:02 PM   Specimen: Nasopharyngeal Swab  Result Value Ref Range Status   SARS Coronavirus 2 by RT PCR NEGATIVE NEGATIVE Final    Comment: (NOTE) SARS-CoV-2 target nucleic acids are NOT DETECTED.  The SARS-CoV-2 RNA is generally detectable in upper respiratoy specimens during the acute phase of infection. The lowest concentration of SARS-CoV-2 viral copies this assay can detect is 131 copies/mL. A negative result does not preclude SARS-Cov-2 infection and should not be used as the sole basis for treatment or other patient management decisions.  A negative result may occur with  improper specimen  collection/handling, submission of specimen other than nasopharyngeal swab, presence of viral mutation(s) within the areas targeted by this assay, and inadequate number of viral copies (<131 copies/mL). A negative result must be combined with clinical observations, patient history, and epidemiological information. The expected result is Negative.  Fact Sheet for Patients:  PinkCheek.be  Fact Sheet for Healthcare Providers:  GravelBags.it  This test is no t yet approved or cleared by the [place] FDA and  has been authorized for detection and/or diagnosis of SARS-CoV-2 by FDA under an Emergency Use Authorization (EUA). This EUA will remain  in effect (meaning this test can be used) for the duration of the COVID-19 declaration under Section 564(b)(1) of the Act, 21 U.S.C. section 360bbb-3(b)(1), unless the authorization is terminated or revoked sooner.     Influenza A by PCR NEGATIVE NEGATIVE Final   Influenza B by PCR NEGATIVE NEGATIVE Final    Comment: (NOTE) The Xpert Xpress SARS-CoV-2/FLU/RSV assay is intended as an aid in  the diagnosis of influenza from Nasopharyngeal swab specimens and  should not be used as a sole basis for treatment. Nasal washings and  aspirates are unacceptable for Xpert Xpress SARS-CoV-2/FLU/RSV  testing.  Fact Sheet for Patients: PinkCheek.be  Fact Sheet for Healthcare Providers: GravelBags.it  This test is not yet approved or cleared by the [place] FDA and  has been authorized for detection and/or diagnosis of SARS-CoV-2 by  FDA under an Emergency Use Authorization (EUA). This EUA will remain  in effect (meaning this test can be used) for the duration of the  Covid-19 declaration under Section 564(b)(1) of the Act, 21  U.S.C. section 360bbb-3(b)(1), unless the authorization is  terminated or revoked. Performed at [facility], [address] [place], [place] [address]   MRSA PCR Screening     Status: None   Collection Time: 08/07/20  2:34 PM   Specimen: Nasal Mucosa; Nasopharyngeal  Result Value Ref Range Status   MRSA by PCR NEGATIVE NEGATIVE Final    Comment:        The GeneXpert MRSA Assay (FDA approved for NASAL specimens only), is one component of a comprehensive MRSA colonization surveillance program. It is not intended to diagnose MRSA infection nor to guide or monitor treatment for MRSA infections. Performed at [facility], [address] [place], [place] [address]   Blood culture (routine x 2)     Status: None (Preliminary result)   Collection Time: 08/07/20  4:50 PM   Specimen: BLOOD LEFT FOREARM  Result Value Ref Range Status   Specimen Description BLOOD LEFT FOREARM  Final   Special Requests   Final    BOTTLES DRAWN AEROBIC AND ANAEROBIC Blood Culture adequate volume   Culture   Final    NO GROWTH 2 DAYS Performed at [facility], [address], [place], [place] [address]    Report Status PENDING  Incomplete  Blood culture (routine x 2)     Status: None (Preliminary result)   Collection Time: 08/07/20  4:50 PM   Specimen: BLOOD RIGHT HAND  Result Value Ref Range Status   Specimen Description BLOOD RIGHT HAND  Final   Special Requests   Final    BOTTLES DRAWN AEROBIC AND ANAEROBIC Blood Culture adequate volume   Culture   Final    NO GROWTH 2 DAYS Performed at [facility], [address] [place], [place] [address]    Report Status PENDING  Incomplete  Labs: Basic Metabolic Panel: Recent Labs  Lab 08/07/20 1234 08/07/20 1234 08/08/20 0530 08/09/20 0549  NA 138  --  138 139  K 4.2   < > 4.4 4.1  CL 104  --  105 107  CO2 25  --  26 26  GLUCOSE 110*  --  99 99  BUN 20  --  17 20  CREATININE 1.14  --  0.95 0.99  CALCIUM 8.7*  --  9.2 8.6*  MG  --   --   --  2.0   < > = values in this interval not displayed.   Liver Function Tests: Recent Labs  Lab  08/07/20 1234  AST 20  ALT 14  ALKPHOS 67  BILITOT 0.7  PROT 6.9  ALBUMIN 3.0*   No results for input(s): LIPASE, AMYLASE in the last 168 hours. No results for input(s): AMMONIA in the last 168 hours. CBC: Recent Labs  Lab 08/07/20 1234 08/08/20 0530 08/09/20 0549  WBC 24.5* 18.6* 11.6*  NEUTROABS 20.7*  --   --   HGB 11.1* 11.3* 9.6*  HCT 34.5* 35.1* 29.8*  MCV 88.9 89.1 89.5  PLT 314 304 294   Cardiac Enzymes: No results for input(s): CKTOTAL, CKMB, CKMBINDEX, TROPONINI in the last 168 hours. BNP: Invalid input(s): POCBNP CBG: No results for input(s): GLUCAP in the last 168 hours.  Time coordinating discharge:  36 minutes  Signed:  Orson Eva, DO [facility] Pager: [phone] 08/09/2020, 10:22 AM

## 2020-08-10 LAB — LEGIONELLA PNEUMOPHILA SEROGP 1 UR AG: L. pneumophila Serogp 1 Ur Ag: NEGATIVE

## 2020-08-12 LAB — CULTURE, BLOOD (ROUTINE X 2)
Culture: NO GROWTH
Culture: NO GROWTH
Special Requests: ADEQUATE
Special Requests: ADEQUATE

## 2020-08-16 DIAGNOSIS — I639 Cerebral infarction, unspecified: Secondary | ICD-10-CM | POA: Diagnosis not present

## 2020-08-16 DIAGNOSIS — Z6823 Body mass index (BMI) 23.0-23.9, adult: Secondary | ICD-10-CM | POA: Diagnosis not present

## 2020-08-16 DIAGNOSIS — J189 Pneumonia, unspecified organism: Secondary | ICD-10-CM | POA: Diagnosis not present

## 2020-08-28 DIAGNOSIS — G8929 Other chronic pain: Secondary | ICD-10-CM | POA: Diagnosis not present

## 2020-08-28 DIAGNOSIS — F4542 Pain disorder with related psychological factors: Secondary | ICD-10-CM | POA: Diagnosis not present

## 2020-08-28 DIAGNOSIS — E7849 Other hyperlipidemia: Secondary | ICD-10-CM | POA: Diagnosis not present

## 2020-09-25 ENCOUNTER — Encounter (HOSPITAL_COMMUNITY): Admission: EM | Disposition: E | Payer: Self-pay | Source: Home / Self Care | Attending: Cardiovascular Disease

## 2020-09-25 ENCOUNTER — Inpatient Hospital Stay (HOSPITAL_COMMUNITY): Payer: No Typology Code available for payment source

## 2020-09-25 DIAGNOSIS — Z8679 Personal history of other diseases of the circulatory system: Secondary | ICD-10-CM

## 2020-09-25 DIAGNOSIS — K921 Melena: Secondary | ICD-10-CM | POA: Diagnosis not present

## 2020-09-25 DIAGNOSIS — I700 Atherosclerosis of aorta: Secondary | ICD-10-CM

## 2020-09-25 DIAGNOSIS — I5021 Acute systolic (congestive) heart failure: Secondary | ICD-10-CM | POA: Diagnosis present

## 2020-09-25 DIAGNOSIS — I499 Cardiac arrhythmia, unspecified: Secondary | ICD-10-CM | POA: Diagnosis not present

## 2020-09-25 DIAGNOSIS — Z9981 Dependence on supplemental oxygen: Secondary | ICD-10-CM

## 2020-09-25 DIAGNOSIS — N400 Enlarged prostate without lower urinary tract symptoms: Secondary | ICD-10-CM | POA: Diagnosis present

## 2020-09-25 DIAGNOSIS — G4710 Hypersomnia, unspecified: Secondary | ICD-10-CM | POA: Diagnosis present

## 2020-09-25 DIAGNOSIS — I469 Cardiac arrest, cause unspecified: Secondary | ICD-10-CM | POA: Diagnosis not present

## 2020-09-25 DIAGNOSIS — Z20822 Contact with and (suspected) exposure to covid-19: Secondary | ICD-10-CM | POA: Diagnosis present

## 2020-09-25 DIAGNOSIS — Z87891 Personal history of nicotine dependence: Secondary | ICD-10-CM | POA: Diagnosis not present

## 2020-09-25 DIAGNOSIS — I6782 Cerebral ischemia: Secondary | ICD-10-CM | POA: Diagnosis not present

## 2020-09-25 DIAGNOSIS — R570 Cardiogenic shock: Secondary | ICD-10-CM | POA: Diagnosis present

## 2020-09-25 DIAGNOSIS — F4542 Pain disorder with related psychological factors: Secondary | ICD-10-CM | POA: Diagnosis not present

## 2020-09-25 DIAGNOSIS — I509 Heart failure, unspecified: Secondary | ICD-10-CM

## 2020-09-25 DIAGNOSIS — I714 Abdominal aortic aneurysm, without rupture, unspecified: Secondary | ICD-10-CM

## 2020-09-25 DIAGNOSIS — I213 ST elevation (STEMI) myocardial infarction of unspecified site: Secondary | ICD-10-CM | POA: Diagnosis present

## 2020-09-25 DIAGNOSIS — G959 Disease of spinal cord, unspecified: Secondary | ICD-10-CM | POA: Diagnosis present

## 2020-09-25 DIAGNOSIS — I2111 ST elevation (STEMI) myocardial infarction involving right coronary artery: Secondary | ICD-10-CM

## 2020-09-25 DIAGNOSIS — Z885 Allergy status to narcotic agent status: Secondary | ICD-10-CM

## 2020-09-25 DIAGNOSIS — I2119 ST elevation (STEMI) myocardial infarction involving other coronary artery of inferior wall: Principal | ICD-10-CM | POA: Diagnosis present

## 2020-09-25 DIAGNOSIS — Z79891 Long term (current) use of opiate analgesic: Secondary | ICD-10-CM

## 2020-09-25 DIAGNOSIS — I69398 Other sequelae of cerebral infarction: Secondary | ICD-10-CM

## 2020-09-25 DIAGNOSIS — I255 Ischemic cardiomyopathy: Secondary | ICD-10-CM | POA: Diagnosis present

## 2020-09-25 DIAGNOSIS — R0602 Shortness of breath: Secondary | ICD-10-CM

## 2020-09-25 DIAGNOSIS — R918 Other nonspecific abnormal finding of lung field: Secondary | ICD-10-CM | POA: Diagnosis not present

## 2020-09-25 DIAGNOSIS — J9601 Acute respiratory failure with hypoxia: Secondary | ICD-10-CM | POA: Diagnosis not present

## 2020-09-25 DIAGNOSIS — I6523 Occlusion and stenosis of bilateral carotid arteries: Secondary | ICD-10-CM | POA: Diagnosis not present

## 2020-09-25 DIAGNOSIS — I340 Nonrheumatic mitral (valve) insufficiency: Secondary | ICD-10-CM | POA: Diagnosis not present

## 2020-09-25 DIAGNOSIS — R011 Cardiac murmur, unspecified: Secondary | ICD-10-CM | POA: Diagnosis present

## 2020-09-25 DIAGNOSIS — R509 Fever, unspecified: Secondary | ICD-10-CM | POA: Diagnosis not present

## 2020-09-25 DIAGNOSIS — I2510 Atherosclerotic heart disease of native coronary artery without angina pectoris: Secondary | ICD-10-CM | POA: Diagnosis present

## 2020-09-25 DIAGNOSIS — I130 Hypertensive heart and chronic kidney disease with heart failure and stage 1 through stage 4 chronic kidney disease, or unspecified chronic kidney disease: Secondary | ICD-10-CM | POA: Diagnosis present

## 2020-09-25 DIAGNOSIS — D72829 Elevated white blood cell count, unspecified: Secondary | ICD-10-CM | POA: Diagnosis not present

## 2020-09-25 DIAGNOSIS — I5023 Acute on chronic systolic (congestive) heart failure: Secondary | ICD-10-CM | POA: Diagnosis not present

## 2020-09-25 DIAGNOSIS — M4802 Spinal stenosis, cervical region: Secondary | ICD-10-CM | POA: Diagnosis present

## 2020-09-25 DIAGNOSIS — Z79899 Other long term (current) drug therapy: Secondary | ICD-10-CM

## 2020-09-25 DIAGNOSIS — J90 Pleural effusion, not elsewhere classified: Secondary | ICD-10-CM | POA: Diagnosis not present

## 2020-09-25 DIAGNOSIS — E7849 Other hyperlipidemia: Secondary | ICD-10-CM | POA: Diagnosis not present

## 2020-09-25 DIAGNOSIS — J9621 Acute and chronic respiratory failure with hypoxia: Secondary | ICD-10-CM | POA: Diagnosis present

## 2020-09-25 DIAGNOSIS — K219 Gastro-esophageal reflux disease without esophagitis: Secondary | ICD-10-CM | POA: Diagnosis present

## 2020-09-25 DIAGNOSIS — I959 Hypotension, unspecified: Secondary | ICD-10-CM

## 2020-09-25 DIAGNOSIS — G4489 Other headache syndrome: Secondary | ICD-10-CM | POA: Diagnosis not present

## 2020-09-25 DIAGNOSIS — R079 Chest pain, unspecified: Secondary | ICD-10-CM | POA: Diagnosis not present

## 2020-09-25 DIAGNOSIS — Z8701 Personal history of pneumonia (recurrent): Secondary | ICD-10-CM

## 2020-09-25 DIAGNOSIS — E785 Hyperlipidemia, unspecified: Secondary | ICD-10-CM | POA: Diagnosis present

## 2020-09-25 DIAGNOSIS — N1831 Chronic kidney disease, stage 3a: Secondary | ICD-10-CM | POA: Diagnosis present

## 2020-09-25 DIAGNOSIS — R739 Hyperglycemia, unspecified: Secondary | ICD-10-CM | POA: Diagnosis present

## 2020-09-25 DIAGNOSIS — R480 Dyslexia and alexia: Secondary | ICD-10-CM | POA: Diagnosis present

## 2020-09-25 DIAGNOSIS — J189 Pneumonia, unspecified organism: Secondary | ICD-10-CM | POA: Diagnosis present

## 2020-09-25 DIAGNOSIS — R609 Edema, unspecified: Secondary | ICD-10-CM | POA: Diagnosis not present

## 2020-09-25 DIAGNOSIS — G8929 Other chronic pain: Secondary | ICD-10-CM | POA: Diagnosis not present

## 2020-09-25 DIAGNOSIS — Z8673 Personal history of transient ischemic attack (TIA), and cerebral infarction without residual deficits: Secondary | ICD-10-CM | POA: Diagnosis not present

## 2020-09-25 DIAGNOSIS — Z7982 Long term (current) use of aspirin: Secondary | ICD-10-CM

## 2020-09-25 DIAGNOSIS — I639 Cerebral infarction, unspecified: Secondary | ICD-10-CM | POA: Diagnosis not present

## 2020-09-25 DIAGNOSIS — I4901 Ventricular fibrillation: Secondary | ICD-10-CM | POA: Diagnosis not present

## 2020-09-25 DIAGNOSIS — M5412 Radiculopathy, cervical region: Secondary | ICD-10-CM | POA: Diagnosis present

## 2020-09-25 DIAGNOSIS — M5418 Radiculopathy, sacral and sacrococcygeal region: Secondary | ICD-10-CM | POA: Diagnosis present

## 2020-09-25 DIAGNOSIS — R0789 Other chest pain: Secondary | ICD-10-CM | POA: Diagnosis not present

## 2020-09-25 DIAGNOSIS — G9389 Other specified disorders of brain: Secondary | ICD-10-CM | POA: Diagnosis not present

## 2020-09-25 DIAGNOSIS — Z01810 Encounter for preprocedural cardiovascular examination: Secondary | ICD-10-CM | POA: Diagnosis not present

## 2020-09-25 HISTORY — PX: RIGHT/LEFT HEART CATH AND CORONARY ANGIOGRAPHY: CATH118266

## 2020-09-25 LAB — COMPREHENSIVE METABOLIC PANEL
ALT: 19 U/L (ref 0–44)
AST: 50 U/L — ABNORMAL HIGH (ref 15–41)
Albumin: 2.8 g/dL — ABNORMAL LOW (ref 3.5–5.0)
Alkaline Phosphatase: 66 U/L (ref 38–126)
Anion gap: 8 (ref 5–15)
BUN: 18 mg/dL (ref 8–23)
CO2: 21 mmol/L — ABNORMAL LOW (ref 22–32)
Calcium: 8.4 mg/dL — ABNORMAL LOW (ref 8.9–10.3)
Chloride: 106 mmol/L (ref 98–111)
Creatinine, Ser: 1.32 mg/dL — ABNORMAL HIGH (ref 0.61–1.24)
GFR, Estimated: 53 mL/min — ABNORMAL LOW (ref >60)
Glucose, Bld: 142 mg/dL — ABNORMAL HIGH (ref 70–99)
Potassium: 4.5 mmol/L (ref 3.5–5.1)
Sodium: 135 mmol/L (ref 135–145)
Total Bilirubin: 0.8 mg/dL (ref 0.3–1.2)
Total Protein: 6 g/dL — ABNORMAL LOW (ref 6.5–8.1)

## 2020-09-25 LAB — COOXEMETRY PANEL
Carboxyhemoglobin: 1.1 % (ref 0.5–1.5)
Methemoglobin: 0.9 % (ref 0.0–1.5)
O2 Saturation: 58.6 %
Total hemoglobin: 10.2 g/dL — ABNORMAL LOW (ref 12.0–16.0)

## 2020-09-25 LAB — RESP PANEL BY RT-PCR (FLU A&B, COVID) ARPGX2
Influenza A by PCR: NEGATIVE
Influenza B by PCR: NEGATIVE
SARS Coronavirus 2 by RT PCR: NEGATIVE

## 2020-09-25 LAB — MRSA PCR SCREENING: MRSA by PCR: NEGATIVE

## 2020-09-25 LAB — CBC
HCT: 32.4 % — ABNORMAL LOW (ref 39.0–52.0)
Hemoglobin: 9.9 g/dL — ABNORMAL LOW (ref 13.0–17.0)
MCH: 27.4 pg (ref 26.0–34.0)
MCHC: 30.6 g/dL (ref 30.0–36.0)
MCV: 89.8 fL (ref 80.0–100.0)
Platelets: 198 10*3/uL (ref 150–400)
RBC: 3.61 MIL/uL — ABNORMAL LOW (ref 4.22–5.81)
RDW: 14.5 % (ref 11.5–15.5)
WBC: 20.5 10*3/uL — ABNORMAL HIGH (ref 4.0–10.5)
nRBC: 0 % (ref 0.0–0.2)

## 2020-09-25 LAB — I-STAT CHEM 8, ED
BUN: 19 mg/dL (ref 8–23)
Calcium, Ion: 1.12 mmol/L — ABNORMAL LOW (ref 1.15–1.40)
Chloride: 106 mmol/L (ref 98–111)
Creatinine, Ser: 1.2 mg/dL (ref 0.61–1.24)
Glucose, Bld: 140 mg/dL — ABNORMAL HIGH (ref 70–99)
HCT: 28 % — ABNORMAL LOW (ref 39.0–52.0)
Hemoglobin: 9.5 g/dL — ABNORMAL LOW (ref 13.0–17.0)
Potassium: 4.5 mmol/L (ref 3.5–5.1)
Sodium: 138 mmol/L (ref 135–145)
TCO2: 20 mmol/L — ABNORMAL LOW (ref 22–32)

## 2020-09-25 LAB — CBC WITH DIFFERENTIAL/PLATELET
Abs Immature Granulocytes: 0.08 10*3/uL — ABNORMAL HIGH (ref 0.00–0.07)
Basophils Absolute: 0 10*3/uL (ref 0.0–0.1)
Basophils Relative: 0 %
Eosinophils Absolute: 0 10*3/uL (ref 0.0–0.5)
Eosinophils Relative: 0 %
HCT: 29.6 % — ABNORMAL LOW (ref 39.0–52.0)
Hemoglobin: 9 g/dL — ABNORMAL LOW (ref 13.0–17.0)
Immature Granulocytes: 1 %
Lymphocytes Relative: 9 %
Lymphs Abs: 1 10*3/uL (ref 0.7–4.0)
MCH: 27.6 pg (ref 26.0–34.0)
MCHC: 30.4 g/dL (ref 30.0–36.0)
MCV: 90.8 fL (ref 80.0–100.0)
Monocytes Absolute: 1.8 10*3/uL — ABNORMAL HIGH (ref 0.1–1.0)
Monocytes Relative: 15 %
Neutro Abs: 8.9 10*3/uL — ABNORMAL HIGH (ref 1.7–7.7)
Neutrophils Relative %: 75 %
Platelets: 166 10*3/uL (ref 150–400)
RBC: 3.26 MIL/uL — ABNORMAL LOW (ref 4.22–5.81)
RDW: 14.4 % (ref 11.5–15.5)
WBC: 11.8 10*3/uL — ABNORMAL HIGH (ref 4.0–10.5)
nRBC: 0 % (ref 0.0–0.2)

## 2020-09-25 LAB — PROTIME-INR
INR: 1.2 (ref 0.8–1.2)
Prothrombin Time: 14.4 seconds (ref 11.4–15.2)

## 2020-09-25 LAB — LIPID PANEL
Cholesterol: 105 mg/dL (ref 0–200)
HDL: 25 mg/dL — ABNORMAL LOW (ref >40)
LDL Cholesterol: 67 mg/dL (ref 0–99)
Total CHOL/HDL Ratio: 4.2 RATIO
Triglycerides: 64 mg/dL (ref <150)
VLDL: 13 mg/dL (ref 0–40)

## 2020-09-25 LAB — TROPONIN I (HIGH SENSITIVITY)
Troponin I (High Sensitivity): 7362 ng/L (ref <18)
Troponin I (High Sensitivity): 11750 ng/L (ref <18)

## 2020-09-25 LAB — HEMOGLOBIN A1C
Hgb A1c MFr Bld: 5.7 % — ABNORMAL HIGH (ref 4.8–5.6)
Mean Plasma Glucose: 116.89 mg/dL

## 2020-09-25 LAB — APTT: aPTT: 29 seconds (ref 24–36)

## 2020-09-25 SURGERY — RIGHT/LEFT HEART CATH AND CORONARY ANGIOGRAPHY
Anesthesia: LOCAL

## 2020-09-25 MED ORDER — LABETALOL HCL 5 MG/ML IV SOLN: 10.0000 mg | INTRAVENOUS | Status: DC | PRN

## 2020-09-25 MED ORDER — ASCORBIC ACID 500 MG PO TABS
500.0000 mg | ORAL_TABLET | Freq: Every day | ORAL | Status: DC
  Administered 2020-09-26 – 2020-09-28 (×3): 500 mg via ORAL
  Filled 2020-09-25 (×4): qty 1

## 2020-09-25 MED ORDER — HEPARIN (PORCINE) IN NACL 1000-0.9 UT/500ML-% IV SOLN
INTRAVENOUS | Status: AC
  Filled 2020-09-25: qty 1500

## 2020-09-25 MED ORDER — SODIUM CHLORIDE 0.9 % IV SOLN: 250.0000 mL | INTRAVENOUS | Status: DC | PRN

## 2020-09-25 MED ORDER — VITAMIN D 25 MCG (1000 UNIT) PO TABS
2000.0000 [IU] | ORAL_TABLET | Freq: Every day | ORAL | Status: DC
  Administered 2020-09-26 – 2020-09-28 (×3): 2000 [IU] via ORAL
  Filled 2020-09-25 (×3): qty 2
  Filled 2020-09-25: qty 1

## 2020-09-25 MED ORDER — VITAMIN C 500 MG PO CAPS: 500.0000 mg | ORAL_CAPSULE | Freq: Every day | ORAL | Status: DC

## 2020-09-25 MED ORDER — HEPARIN SODIUM (PORCINE) 5000 UNIT/ML IJ SOLN
4000.0000 [IU] | Freq: Once | INTRAMUSCULAR | Status: AC
  Administered 2020-09-25: 4000 [IU] via INTRAVENOUS

## 2020-09-25 MED ORDER — VERAPAMIL HCL 2.5 MG/ML IV SOLN
INTRAVENOUS | Status: DC | PRN
  Administered 2020-09-25: 10 mL via INTRA_ARTERIAL

## 2020-09-25 MED ORDER — GABAPENTIN 400 MG PO CAPS
400.0000 mg | ORAL_CAPSULE | Freq: Three times a day (TID) | ORAL | Status: DC
  Administered 2020-09-25 – 2020-09-28 (×10): 400 mg via ORAL
  Filled 2020-09-25 (×10): qty 1

## 2020-09-25 MED ORDER — IOHEXOL 350 MG/ML SOLN
INTRAVENOUS | Status: AC
  Filled 2020-09-25: qty 1

## 2020-09-25 MED ORDER — ROPINIROLE HCL 1 MG PO TABS
1.0000 mg | ORAL_TABLET | Freq: Every day | ORAL | Status: DC
  Administered 2020-09-25 – 2020-09-28 (×4): 1 mg via ORAL
  Filled 2020-09-25 (×4): qty 1

## 2020-09-25 MED ORDER — ASPIRIN EC 81 MG PO TBEC
81.0000 mg | DELAYED_RELEASE_TABLET | Freq: Every day | ORAL | Status: DC
  Administered 2020-09-26 – 2020-09-28 (×3): 81 mg via ORAL
  Filled 2020-09-25 (×3): qty 1

## 2020-09-25 MED ORDER — NOREPINEPHRINE BITARTRATE 1 MG/ML IV SOLN
INTRAVENOUS | Status: AC | PRN
  Administered 2020-09-25: 10 ug/min via INTRAVENOUS

## 2020-09-25 MED ORDER — ACETAMINOPHEN 500 MG PO TABS
1000.0000 mg | ORAL_TABLET | Freq: Four times a day (QID) | ORAL | Status: DC | PRN
  Administered 2020-09-25 – 2020-09-26 (×3): 1000 mg via ORAL
  Filled 2020-09-25 (×3): qty 2

## 2020-09-25 MED ORDER — TIROFIBAN HCL IN NACL 5-0.9 MG/100ML-% IV SOLN
INTRAVENOUS | Status: AC
  Filled 2020-09-25: qty 100

## 2020-09-25 MED ORDER — FUROSEMIDE 10 MG/ML IJ SOLN
INTRAMUSCULAR | Status: DC | PRN
  Administered 2020-09-25: 20 mg via INTRAVENOUS

## 2020-09-25 MED ORDER — NOREPINEPHRINE 4 MG/250ML-% IV SOLN
INTRAVENOUS | Status: AC
  Filled 2020-09-25: qty 250

## 2020-09-25 MED ORDER — FENTANYL CITRATE (PF) 100 MCG/2ML IJ SOLN
INTRAMUSCULAR | Status: DC | PRN
  Administered 2020-09-25: 25 ug via INTRAVENOUS

## 2020-09-25 MED ORDER — SODIUM CHLORIDE 0.9% FLUSH
3.0000 mL | INTRAVENOUS | Status: DC | PRN
  Administered 2020-09-25: 3 mL via INTRAVENOUS

## 2020-09-25 MED ORDER — NOREPINEPHRINE 4 MG/250ML-% IV SOLN
0.0000 ug/min | INTRAVENOUS | Status: DC
  Administered 2020-09-25: 11 ug/min via INTRAVENOUS
  Administered 2020-09-26: 6 ug/min via INTRAVENOUS
  Administered 2020-09-26: 7 ug/min via INTRAVENOUS
  Administered 2020-09-27: 10 ug/min via INTRAVENOUS
  Administered 2020-09-27: 4 ug/min via INTRAVENOUS
  Administered 2020-09-28: 7 ug/min via INTRAVENOUS
  Administered 2020-09-28: 9 ug/min via INTRAVENOUS
  Administered 2020-09-28: 6 ug/min via INTRAVENOUS
  Administered 2020-09-29: 15 ug/min via INTRAVENOUS
  Filled 2020-09-25 (×10): qty 250

## 2020-09-25 MED ORDER — FENTANYL CITRATE (PF) 100 MCG/2ML IJ SOLN
25.0000 ug | INTRAMUSCULAR | Status: DC | PRN
  Administered 2020-09-26 (×3): 25 ug via INTRAVENOUS
  Filled 2020-09-25 (×2): qty 2

## 2020-09-25 MED ORDER — TIROFIBAN HCL IN NACL 5-0.9 MG/100ML-% IV SOLN
0.0750 ug/kg/min | INTRAVENOUS | Status: DC
  Administered 2020-09-26 – 2020-09-28 (×5): 0.075 ug/kg/min via INTRAVENOUS
  Filled 2020-09-25 (×5): qty 100

## 2020-09-25 MED ORDER — SODIUM CHLORIDE 0.9% FLUSH
3.0000 mL | Freq: Two times a day (BID) | INTRAVENOUS | Status: DC
  Administered 2020-09-25 – 2020-09-28 (×6): 3 mL via INTRAVENOUS

## 2020-09-25 MED ORDER — HYDRALAZINE HCL 20 MG/ML IJ SOLN: 10.0000 mg | INTRAMUSCULAR | Status: AC | PRN

## 2020-09-25 MED ORDER — SODIUM CHLORIDE 0.9 % IV SOLN: INTRAVENOUS | Status: DC

## 2020-09-25 MED ORDER — LIDOCAINE HCL (PF) 1 % IJ SOLN
INTRAMUSCULAR | Status: AC
  Filled 2020-09-25: qty 30

## 2020-09-25 MED ORDER — VERAPAMIL HCL 2.5 MG/ML IV SOLN
INTRAVENOUS | Status: AC
  Filled 2020-09-25: qty 2

## 2020-09-25 MED ORDER — HEPARIN (PORCINE) IN NACL 1000-0.9 UT/500ML-% IV SOLN
INTRAVENOUS | Status: DC | PRN
  Administered 2020-09-25 (×2): 500 mL

## 2020-09-25 MED ORDER — LIDOCAINE HCL (PF) 1 % IJ SOLN
INTRAMUSCULAR | Status: DC | PRN
  Administered 2020-09-25: 5 mL via SUBCUTANEOUS
  Administered 2020-09-25: 10 mL via SUBCUTANEOUS

## 2020-09-25 MED ORDER — ONDANSETRON HCL 4 MG/2ML IJ SOLN
4.0000 mg | Freq: Four times a day (QID) | INTRAMUSCULAR | Status: DC | PRN
  Administered 2020-09-26 – 2020-09-29 (×3): 4 mg via INTRAVENOUS
  Filled 2020-09-25 (×3): qty 2

## 2020-09-25 MED ORDER — SODIUM CHLORIDE 0.9 % IV SOLN
INTRAVENOUS | Status: AC | PRN
  Administered 2020-09-25: 10 mL/h via INTRAVENOUS

## 2020-09-25 MED ORDER — FENTANYL CITRATE (PF) 100 MCG/2ML IJ SOLN
INTRAMUSCULAR | Status: AC
  Filled 2020-09-25: qty 2

## 2020-09-25 MED ORDER — FUROSEMIDE 10 MG/ML IJ SOLN
INTRAMUSCULAR | Status: AC
  Filled 2020-09-25: qty 4

## 2020-09-25 MED ORDER — HEPARIN SODIUM (PORCINE) 1000 UNIT/ML IJ SOLN
INTRAMUSCULAR | Status: DC | PRN
  Administered 2020-09-25 (×2): 5000 [IU] via INTRAVENOUS

## 2020-09-25 MED ORDER — ATORVASTATIN CALCIUM 80 MG PO TABS
80.0000 mg | ORAL_TABLET | Freq: Every day | ORAL | Status: DC
  Administered 2020-09-26 – 2020-09-28 (×3): 80 mg via ORAL
  Filled 2020-09-25 (×3): qty 1

## 2020-09-25 MED ORDER — ASPIRIN 81 MG PO CHEW: 324.0000 mg | CHEWABLE_TABLET | Freq: Once | ORAL | Status: DC

## 2020-09-25 MED ORDER — IOHEXOL 350 MG/ML SOLN
INTRAVENOUS | Status: DC | PRN
  Administered 2020-09-25: 50 mL via INTRA_ARTERIAL

## 2020-09-25 MED ORDER — TIROFIBAN HCL IN NACL 5-0.9 MG/100ML-% IV SOLN
INTRAVENOUS | Status: AC | PRN
  Administered 2020-09-25: 0.075 ug/kg/min via INTRAVENOUS

## 2020-09-25 MED ORDER — HEPARIN SODIUM (PORCINE) 1000 UNIT/ML IJ SOLN
INTRAMUSCULAR | Status: AC
  Filled 2020-09-25: qty 1

## 2020-09-25 SURGICAL SUPPLY — 16 items
CATH 5FR JL3.5 JR4 ANG PIG MP (CATHETERS) ×1 IMPLANT
CATH INFINITI 5FR JL4 (CATHETERS) ×1 IMPLANT
CATH SWAN GANZ VIP 7.5F (CATHETERS) ×1 IMPLANT
DEVICE RAD COMP TR BAND LRG (VASCULAR PRODUCTS) ×1 IMPLANT
GLIDESHEATH SLEND SS 6F .021 (SHEATH) ×1 IMPLANT
GUIDEWIRE INQWIRE 1.5J.035X260 (WIRE) IMPLANT
INQWIRE 1.5J .035X260CM (WIRE) ×2
KIT HEART LEFT (KITS) ×2 IMPLANT
KIT HEMO VALVE WATCHDOG (MISCELLANEOUS) ×1 IMPLANT
KIT MICROPUNCTURE NIT STIFF (SHEATH) ×1 IMPLANT
PACK CARDIAC CATHETERIZATION (CUSTOM PROCEDURE TRAY) ×2 IMPLANT
SHEATH PINNACLE 8F 10CM (SHEATH) ×1 IMPLANT
SLEEVE REPOSITIONING LENGTH 30 (MISCELLANEOUS) ×1 IMPLANT
TRANSDUCER W/STOPCOCK (MISCELLANEOUS) ×2 IMPLANT
TUBING CIL FLEX 10 FLL-RA (TUBING) ×2 IMPLANT
WIRE HI TORQ VERSACORE-J 145CM (WIRE) ×1 IMPLANT

## 2020-09-25 NOTE — ED Notes (Signed)
Pt transported to cath lab.  

## 2020-09-25 NOTE — Progress Notes (Signed)
Chaplain responding to code STEMI. Healthcare team working with pt upon arrival. EMS shared that pt's wife is en route to the hospital. Please page chaplain for follow-up spiritual/emotional support as needed.  Respectfully submitted, Ortencia Kick, MDiv     09/06/2020 1700  Clinical Encounter Type  Visited With Health care provider  Visit Type Initial;Other (Comment) (code STEMI)

## 2020-09-25 NOTE — H&P (Addendum)
CARDIOLOGY ADMISSION NOTE  Patient ID: Kevin Matthews MRN: [id number] DOB/AGE: 1933/12/12 86 y.o.  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  - s/p aspirin 324mg . AVOID nitro.  - give IVF continuous  - will probably load with Plavix in the cath lab. Will hold off on betablocker or acei for now. Once hemodynamically stable, will gradually start. start atorva 80mg  daily. -resume home meds except BP meds.  - will get ECHO in am FULL CODE   Patient was seen and examined with Dr Sallyanne Kuster. ---------------------------------------------------------------------------------------------------  Patient profile:  [name] is a 86 y.o. male with history of HTN, HLD, recent stroke in 06/2020 and recent hospitalization 07/2020 for PNA. Pt presented to [facility] found to have ST elevation inferior lead with ST depression in V2 - supsect posterior infarction +/- right ventricular infarction     HPI: Kevin Matthews is a 86 y.o. male with history of HTN, HLD, recent stroke in 06/2020 and recent hospitalization 07/2020 for PNA presented to OSH with c/o chest pain since 3 am this morning and found to have inferior STEMI and transferred to [facility] for further care.  Patient reports he woke up with severe back pain, shoulder pain that radiated to the chest. Initially thought it was shoulder pain but his chest pain got worse througout this morning thus called EMS  and brought to the hospital via EMS who found inferior stemi. En route noted to be hypotensive- so no nitro was given  In the ER: his chest pain was better, he was hypotensive getting IVF EKG as below. AOx3, JVD+, warm legs, 2+ pitting edema, heparin gtt started He is wearing a zio patch on the left chest  EKG:  EMS EKG shows STE in inferior leads Repeat EKG here shows STE in lead III, mild aVF and tall R wave and ST depressions in v2. Mild AVR elevation as well and reciprocal depressions in lateral leads  Last ECHO: 07/30/20 IMPRESSIONS    1. Left ventricular ejection fraction, by estimation, is 55 to 60%. The  left ventricle has normal function. The left ventricle has no regional  wall motion abnormalities. Left ventricular diastolic parameters are  indeterminate.   2. Right ventricular systolic function is normal. The right ventricular  size is normal. Tricuspid regurgitation signal is inadequate for assessing  PA pressure.   3. The mitral valve is grossly normal. Mild mitral valve regurgitation.   4. The aortic valve is tricuspid. There is moderate calcification of the  aortic valve. Aortic valve regurgitation is not visualized. Mild to  moderate aortic valve sclerosis/calcification is present, without any  evidence of aortic stenosis.   5. The inferior vena cava is normal in size with greater than 50%  respiratory variability, suggesting right atrial pressure of 3 mmHg.   Last CT abd/pelvis 4/21 IMPRESSION: VASCULAR   1. Enlarging 4.7 cm fusiform infrarenal abdominal aortic aneurysm when compared to prior imaging from 2012. Recommend followup by abdomen and pelvis CTA in 6 months, and  vascular surgery referral/consultation if not already obtained. This recommendation follows ACR consensus guidelines: White Paper of the ACR Incidental Findings Committee II on Vascular Findings. J Am Coll Radiol 2013; 10:789-794. Aortic aneurysm NOS (ICD10-I71.9); Aortic Atherosclerosis  (ICD10-I70.0). 2. Small accessory renal artery to the left lower pole arising from the diseased segment of the aorta. 3. Moderate focal stenosis of the origin of the superior mesenteric artery. 4. Moderate focal stenoses of the origins of the bilateral main renal arteries. 5. Mild aneurysmal dilation of the right common iliac artery to 2.1 cm. 6. Extensive heterogeneous atherosclerotic plaque throughout the abdominal aorta and iliac arteries. Multiple areas of focal atherosclerotic ulceration are present as described above.   NON-VASCULAR   1. No acute abnormality within the abdomen or pelvis to explain the patient's clinical symptoms. 2. Multilevel degenerative disc disease, facet arthropathy and levoconvex scoliosis could represent a source of chronic back pain. 3. Thoracic epidural spinal stimulator in place. 4. Small fat containing midline ventral hernia just superior to the umbilicus. 5. Colonic diverticular disease without CT evidence of active inflammation. 6. Small hiatal hernia..   Past Medical History:  Diagnosis Date   Arthritis    BPH (benign prostatic hyperplasia)    GERD (gastroesophageal reflux disease)    Heart murmur    dr Feliz Beam   Hypercholesteremia     Past Surgical History:  Procedure Laterality Date   BACK SURGERY     x5 cervical plate;   CAROTID ENDARTERECTOMY Left    CATARACT EXTRACTION W/PHACO Right 07/27/2015   Procedure: CATARACT EXTRACTION PHACO AND INTRAOCULAR LENS PLACEMENT ([place]);  Surgeon: Rutherford Guys, MD;  Location: [facility];  Service: Ophthalmology;  Laterality: Right;  CDE:9.43   CATARACT EXTRACTION W/PHACO Left 08/10/2015   Procedure: CATARACT EXTRACTION PHACO AND INTRAOCULAR LENS PLACEMENT (IOC);  Surgeon: Rutherford Guys, MD;  Location: [facility];  Service: Ophthalmology;  Laterality: Left;  CDE:8.27   LUMBAR LAMINECTOMY/DECOMPRESSION MICRODISCECTOMY N/A 01/07/2014   Procedure: Lumbar three-four, Lumbar four-five redo laminectomy and  foraminotomy;  Surgeon: Ophelia Charter, MD;  Location: [place] [facility];  Service: Neurosurgery;  Laterality: N/A;  Lumbar three-four, Lumbar four-five redo laminectomy and foraminotomy   TONSILLECTOMY     VASCULAR SURGERY      Allergies  Allergen Reactions   Oxycodone Other (See Comments)    States that when he took oxycodone he couldn't move out of the bed. Other reaction(s): Hallucinations   No current facility-administered medications on file prior to encounter.   Current Outpatient Medications on File Prior to Encounter  Medication Sig Dispense Refill   aspirin 325 MG tablet Take 1 tablet (325 mg total) by mouth daily. 30 tablet 0   acetaminophen (TYLENOL) 500 MG tablet Take 1,000 mg by mouth every 6 (six) hours as needed.     Ascorbic Acid (VITAMIN C) 500 MG CAPS Take 500 mg by mouth daily.     cefdinir (OMNICEF) 300 MG capsule Take 1 capsule (300 mg total) by mouth every 12 (twelve) hours. (Patient not taking: Reported on 09/28/2020) 9 capsule 0   Cholecalciferol (VITAMIN D3) 2000 UNITS TABS Take 2,000 Units by mouth daily.     clopidogrel (PLAVIX) 75 MG tablet Take 1 tablet (75 mg total) by mouth daily with breakfast. 30 tablet 2   docusate sodium (COLACE) 100 MG capsule Take 100 mg by mouth 2 (two) times daily.     gabapentin (NEURONTIN) 400 MG capsule Take 400 mg by mouth 3 (three) times daily.  hydrALAZINE (APRESOLINE) 10 MG tablet Take 10 mg by mouth See admin instructions. Take 1/2 tablet by mouth if BP 120 plus standing, or if BP 180 plus sitting. Take 1 tablet by mouth daily as directed.     ibuprofen (ADVIL,MOTRIN) 200 MG tablet Take 600 mg by mouth daily as needed for moderate pain.      omeprazole (PRILOSEC) 20 MG capsule Take 20 mg by mouth 2 (two) times daily before a meal.      pravastatin (PRAVACHOL) 40 MG tablet Take 80 mg by mouth daily.     tamsulosin (FLOMAX) 0.4 MG CAPS capsule Take 1 capsule (0.4 mg total) by mouth daily after supper. (Patient taking  differently: Take 0.4 mg by mouth 2 (two) times daily. ) 30 capsule 0   Social History   Socioeconomic History   Marital status: Married    Spouse name: Not on file   Number of children: Not on file   Years of education: Not on file   Highest education level: Not on file  Occupational History   Not on file  Tobacco Use   Smoking status: Former Smoker    Packs/day: 2.00    Years: 32.00    Pack years: 64.00    Types: Cigarettes    Quit date: 07/21/1979    Years since quitting: 41.2   Smokeless tobacco: Never Used  Substance and Sexual Activity   Alcohol use: Yes    Alcohol/week: 8.0 standard drinks    Types: 8 Cans of beer per week    Comment: weekly   Drug use: No   Sexual activity: Yes    Birth control/protection: None  Other Topics Concern   Not on file  Social History Narrative   Not on file   Social Determinants of Health   Financial Resource Strain:    Difficulty of Paying Living Expenses: Not on file  Food Insecurity:    Worried About Charity fundraiser in the Last Year: Not on file   [employer] of Food in the Last Year: Not on file  Transportation Needs:    Lack of Transportation (Medical): Not on file   Lack of Transportation (Non-Medical): Not on file  Physical Activity:    Days of Exercise per Week: Not on file   Minutes of Exercise per Session: Not on file  Stress:    Feeling of Stress : Not on file  Social Connections:    Frequency of Communication with Friends and Family: Not on file   Frequency of Social Gatherings with Friends and Family: Not on file   Attends Religious Services: Not on file   Active Member of Clubs or Organizations: Not on file   Attends Archivist Meetings: Not on file   Marital Status: Not on file  Intimate Partner Violence:    Fear of Current or Ex-Partner: Not on file   Emotionally Abused: Not on file   Physically Abused: Not on file   Sexually Abused: Not on file    No family history on file.   Review of  Systems: [y] = yes, [ ]  = no      General: Weight gain [] ; Weight loss [ ] ; Anorexia [ ] ; Fatigue [ ] ; Fever [ ] ; Chills [ ] ; Weakness [ ]    Cardiac: Chest pain/pressure [x ]; Resting SOB [ ] ; Exertional SOB [ ] ; Orthopnea [ ] ; Pedal Edema [ ] ; Palpitations [ ] ; Syncope [ ] ; Presyncope [ ] ; Paroxysmal nocturnal dyspnea[ ]   Pulmonary: Cough [ ] ; Wheezing[ ] ; Hemoptysis[ ] ; Sputum [ ] ; Snoring [ ]    GI: Vomiting[ ] ; Dysphagia[ ] ; Melena[ ] ; Hematochezia [ ] ; Heartburn[ ] ; Abdominal pain [ ] ; Constipation [ ] ; Diarrhea [ ] ; BRBPR [ ]    GU: Hematuria[ ] ; Dysuria [ ] ; Nocturia[ ]  Vascular: Pain in legs with walking [ ] ; Pain in feet with lying flat [ ] ; Non-healing sores [ ] ; Stroke [ ] ; TIA [ ] ; Slurred speech [ ] ;   Neuro: Headaches[ ] ; Vertigo[ ] ; Seizures[ ] ; Paresthesias[ ] ;Blurred vision [ ] ; Diplopia [ ] ; Vision changes [ ]    Ortho/Skin: Arthritis [ ] ; Joint pain [ ] ; Muscle pain [ ] ; Joint swelling [ ] ; Back Pain [ ] ; Rash [ ]    Psych: Depression[ ] ; Anxiety[ ]    Heme: Bleeding problems [ ] ; Clotting disorders [ ] ; Anemia [ ]    Endocrine: Diabetes [ ] ; Thyroid dysfunction[ ]   Physical Exam: Blood pressure (!) 87/58, pulse 97, temperature 99 F (37.2 C), temperature source Temporal, resp. rate (!) 27, height 5\' 9"  (1.753 m), weight 77.1 kg, SpO2 98 %.   GENERAL: Patient is afebrile, Vital signs reviewed, Well appearing, Patient appears comfortable, Alert and lucid, elderly gentleman. EYES: Normal inspection. HEENT:  normocephalic, atraumatic , normal ENT inspection. JVD++ ORAL:  Moist NECK:  supple , normal inspection. CARD:  regular rate and rhythm, heart sounds normal. RESP:  no respiratory distress, breath sounds normal. ABD: soft, tender to palpation , BS present, soft, no organomegaly or masses . BACK: non-tender. No CVA tenderness. MUSC:  normal ROM, non-tender , 2+ pedal edema b/l. SKIN: color normal, no rash, warm, dry . NEURO: awake & alert, lucid, no motor/sensory deficit.  Gait stable. PSYCH: mood/affect normal.   Labs: Lab Results  Component Value Date   BUN 19 09/02/2020   Lab Results  Component Value Date   CREATININE 1.20 09/18/2020   Lab Results  Component Value Date   NA 138 09/09/2020   K 4.5 09/27/2020   CL 106 09/09/2020   CO2 26 08/09/2020   No results found for: TROPONINI Lab Results  Component Value Date   WBC 11.8 (H) 09/17/2020   HGB 9.5 (L) 09/13/2020   HCT 28.0 (L) 09/17/2020   MCV 90.8 08/30/2020   PLT 166 09/18/2020   Lab Results  Component Value Date   CHOL 127 07/30/2020   HDL 30 (L) 07/30/2020   LDLCALC 76 07/30/2020   TRIG 103 07/30/2020   CHOLHDL 4.2 07/30/2020   Lab Results  Component Value Date   ALT 14 08/07/2020   AST 20 08/07/2020   ALKPHOS 67 08/07/2020   BILITOT 0.7 08/07/2020      Radiology:   pending    Signed: Renae Fickle 09/09/2020, 5:17 PM  I have seen and examined the patient along with Dr. Rudi Rummage.  I have reviewed the chart, notes and new data.  I agree with his note.  Key new complaints: stuttering chest pressure radiating to shoulders. No sharp or "ripping" pain. Denies dyspnea or syncope. Mild residual deficits from the CVA (only dyslexia, no aphasia, no focal weakness). Key examination changes: prominent JVD, clear lungs, RRR w occ ectopy, no murmurs. 1+ symmetrical ankle edema. Completing antibiotics for recent bacterial pneumonia, without residual cough or pleurisy at this time.  Key new findings / data: ECG w inferior ST elevation, ST depression and tall R wave in V2 c/w posterior infarct extension, subtle ST elevation in V1 and aVR suggests RV infarction.  Preliminary review  of coronary angio images shows thrombotic subtotal occlusion of the RCA at the ostium and in the mid vessel, but with good distal flow. There is also a critical stenosis in the distal left coronary artery, extending to the ostial left circumflex. There is good distal flow to all branches and distal  vessels are healthy, reasonably good surgical targets. EDP 33. Not a good candidate for mechanical assist devices due to extensive atheromatous disease, ulceration of the aorta and moderate size AAA.  PLAN: Difficult situation, without optimal or safe solution for percutaneous revascularization and with increased risk for serious complications with CABG due to age and comorbid conditions. Requesting surgical consultation.  Sanda Klein, MD, [place] [phone] 08/30/2020, 5:39 PM

## 2020-09-25 NOTE — ED Provider Notes (Signed)
[facility] EMERGENCY DEPARTMENT Provider Note  CSN: [id number] Arrival date & time: 09/08/2020 1644    History Chief Complaint  Patient presents with  . Code STEMI    HPI  Kevin Matthews is a 86 y.o. male with history of recent stroke, managed at the [place], currently only taking ASA, no longer on Plavix reports onset of moderate to severe aching pain across his back and into his chest and abdomen starting last night. He initially thought it was indigestion but when it did not improve he called EMS. Initial EMS EKG showed inferior STEMI and a Code STEMI was activated enroute. He was noted to be hypotensive, so no NTG was given . BP improved some with IVF. His pain is 3/10 now.    Past Medical History:  Diagnosis Date  . Arthritis   . BPH (benign prostatic hyperplasia)   . GERD (gastroesophageal reflux disease)   . Heart murmur    dr Feliz Beam  . Hypercholesteremia     Past Surgical History:  Procedure Laterality Date  . BACK SURGERY     x5 cervical plate;  Marland Kitchen CAROTID ENDARTERECTOMY Left   . CATARACT EXTRACTION W/PHACO Right 07/27/2015   Procedure: CATARACT EXTRACTION PHACO AND INTRAOCULAR LENS PLACEMENT (IOC);  Surgeon: Rutherford Guys, MD;  Location: [facility];  Service: Ophthalmology;  Laterality: Right;  CDE:9.43  . CATARACT EXTRACTION W/PHACO Left 08/10/2015   Procedure: CATARACT EXTRACTION PHACO AND INTRAOCULAR LENS PLACEMENT (IOC);  Surgeon: Rutherford Guys, MD;  Location: [facility];  Service: Ophthalmology;  Laterality: Left;  CDE:8.27  . LUMBAR LAMINECTOMY/DECOMPRESSION MICRODISCECTOMY N/A 01/07/2014   Procedure: Lumbar three-four, Lumbar four-five redo laminectomy and foraminotomy;  Surgeon: Ophelia Charter, MD;  Location: [facility];  Service: Neurosurgery;  Laterality: N/A;  Lumbar three-four, Lumbar four-five redo laminectomy and foraminotomy  . TONSILLECTOMY    . VASCULAR SURGERY      No family history on file.  Social History   Tobacco Use  . Smoking status:  Former Smoker    Packs/day: 2.00    Years: 32.00    Pack years: 64.00    Types: Cigarettes    Quit date: 07/21/1979    Years since quitting: 41.2  . Smokeless tobacco: Never Used  Substance Use Topics  . Alcohol use: Yes    Alcohol/week: 8.0 standard drinks    Types: 8 Cans of beer per week    Comment: weekly  . Drug use: No     Home Medications Prior to Admission medications   Medication Sig Start Date End Date Taking? Authorizing Provider  acetaminophen (TYLENOL) 500 MG tablet Take 1,000 mg by mouth every 6 (six) hours as needed.    [provider]  Ascorbic Acid (VITAMIN C) 500 MG CAPS Take 500 mg by mouth daily.    [provider]  aspirin 325 MG tablet Take 1 tablet (325 mg total) by mouth daily. 07/31/20   Barton Dubois, MD  cefdinir (OMNICEF) 300 MG capsule Take 1 capsule (300 mg total) by mouth every 12 (twelve) hours. 08/09/20   Orson Eva, MD  Cholecalciferol (VITAMIN D3) 2000 UNITS TABS Take 2,000 Units by mouth daily.    [provider]  clopidogrel (PLAVIX) 75 MG tablet Take 1 tablet (75 mg total) by mouth daily with breakfast. 07/31/20   Barton Dubois, MD  docusate sodium (COLACE) 100 MG capsule Take 100 mg by mouth 2 (two) times daily.    [provider]  gabapentin (NEURONTIN)  400 MG capsule Take 400 mg by mouth 3 (three) times daily.     [provider]  hydrALAZINE (APRESOLINE) 10 MG tablet Take 10 mg by mouth See admin instructions. Take 1/2 tablet by mouth if BP 120 plus standing, or if BP 180 plus sitting. Take 1 tablet by mouth daily as directed.    [provider]  ibuprofen (ADVIL,MOTRIN) 200 MG tablet Take 600 mg by mouth daily as needed for moderate pain.     [provider]  omeprazole (PRILOSEC) 20 MG capsule Take 20 mg by mouth 2 (two) times daily before a meal.     [provider]  pravastatin (PRAVACHOL) 40 MG tablet Take 80 mg by mouth daily.    [provider]  tamsulosin  (FLOMAX) 0.4 MG CAPS capsule Take 1 capsule (0.4 mg total) by mouth daily after supper. Patient taking differently: Take 0.4 mg by mouth 2 (two) times daily.  01/15/14   Nita Sells, MD     Allergies    Oxycodone   Review of Systems   Review of Systems A comprehensive review of systems was completed and negative except as noted in HPI.    Physical Exam BP (!) 87/58   Pulse 97   Temp 99 F (37.2 C) (Temporal)   Resp (!) 27   Ht 5\' 9"  (1.753 m)   Wt 77.1 kg   SpO2 98%   BMI 25.10 kg/m   Physical Exam Vitals and nursing note reviewed.  Constitutional:      Appearance: Normal appearance.  HENT:     Head: Normocephalic and atraumatic.     Nose: Nose normal.     Mouth/Throat:     Mouth: Mucous membranes are moist.  Eyes:     Extraocular Movements: Extraocular movements intact.     Conjunctiva/sclera: Conjunctivae normal.  Cardiovascular:     Rate and Rhythm: Normal rate.     Comments: ZIO patch on L upper chest Pulmonary:     Effort: Pulmonary effort is normal.     Breath sounds: Normal breath sounds.  Abdominal:     General: Abdomen is flat.     Palpations: Abdomen is soft.     Tenderness: There is no abdominal tenderness.     Hernia: A hernia (easily reducible ventral hernia) is present.  Musculoskeletal:        General: No swelling. Normal range of motion.     Cervical back: Neck supple.  Skin:    General: Skin is warm and dry.  Neurological:     General: No focal deficit present.     Mental Status: He is alert.  Psychiatric:        Mood and Affect: Mood normal.      ED Results / Procedures / Treatments   Labs (all labs ordered are listed, but only abnormal results are displayed) Labs Reviewed  I-STAT CHEM 8, ED - Abnormal; Notable for the following components:      Result Value   Glucose, Bld 140 (*)    Calcium, Ion 1.12 (*)    TCO2 20 (*)    Hemoglobin 9.5 (*)    HCT 28.0 (*)    All other components within normal limits  RESP PANEL BY  RT-PCR (FLU A&B, COVID) ARPGX2  HEMOGLOBIN A1C  CBC WITH DIFFERENTIAL/PLATELET  PROTIME-INR  APTT  COMPREHENSIVE METABOLIC PANEL  LIPID PANEL  TROPONIN I (HIGH SENSITIVITY)    EKG EKG Interpretation  Date/Time:  Saturday September 25 2020 16:49:37 EST  Ventricular Rate:  85 PR Interval:    QRS Duration: 94 QT Interval:  326 QTC Calculation: 388 R Axis:   44 Text Interpretation: Sinus rhythm Supraventricular bigeminy Repol abnrm suggests ischemia, diffuse leads ST elevation in inferior leads Since last tracing ST elevation with reciprocal ST depression is new Confirmed by Calvert Cantor [phone]) on 09/24/2020 5:07:17 PM   Radiology No results found.  Procedures .Critical Care Performed by: Truddie Hidden, MD Authorized by: Truddie Hidden, MD   Critical care provider statement:    Critical care time (minutes):  30   Critical care was necessary to treat or prevent imminent or life-threatening deterioration of the following conditions:  Cardiac failure   Critical care was time spent personally by me on the following activities:  Discussions with consultants, evaluation of patient's response to treatment, examination of patient, ordering and performing treatments and interventions, ordering and review of laboratory studies, ordering and review of radiographic studies, pulse oximetry, re-evaluation of patient's condition, obtaining history from patient or surrogate and review of old charts    Medications Ordered in the ED Medications  0.9 %  sodium chloride infusion ( Intravenous New Bag/Given 09/26/2020 1657)  aspirin chewable tablet 324 mg ( Oral MAR Hold 09/18/2020 1710)  heparin injection 4,000 Units (4,000 Units Intravenous Given 09/11/2020 1650)     MDM Rules/Calculators/A&P MDM  ED Course  I have reviewed the triage vital signs and the nursing notes.  Pertinent labs & imaging results that were available during my care of the patient were reviewed by me and  considered in my medical decision making (see chart for details).  Clinical Course as of Sep 25 1710  Sat Sep 25, 2020  1708 On patient arrival, the cath lab already had a patient on the table. Patient brought to ED room for initial evaluation, given heparin. Discussed case with Dr. Burt Knack, initial consideration given to do CTA given his back pain, history of AAA and thoracic aortic ulcer on CTA done in Apr 2021 however ultimately, Dr. Burt Knack decided to take the patient to cath lab first.    [CS]    Clinical Course User Index [CS] Truddie Hidden, MD    Final Clinical Impression(s) / ED Diagnoses Final diagnoses:  STEMI (ST elevation myocardial infarction) [facility])    Rx / [place] Orders ED Discharge Orders    None       Truddie Hidden, MD 09/19/2020 1711

## 2020-09-25 NOTE — Progress Notes (Signed)
Cardiology follow up note  RHC from cath lab: RA 18 RV 43/13 PA 42/25 (mPAP 30), PCWP 27, LVEDP 33, PA sat 53%, AO 98%, AO 113/60 mmHg.  CI 2.2, CO 4.3 on leveophed 7mcg/min.  PAPi was 0.9 CPO 0.7  Assessed patient at bedside, laying 45 degrees, reports doing ok Chest pain was on and off but currently none. Will report [place] if he is having more chest pain or difficulty breathing  Current hemodynamics: Temp 100.9  RR 28 HR 87 BP 108/66 On levophed 69mcg/min, sats 97% on 2lts  On exam has crackles b/l, warm legs Latest PA #: CVP 11, PA 47/18,  ScVO2 58 PAPi has improved  CI/CO slowly improving S/p IV lasix 20mg  in the cath lab-> after foley cathter placement put out ~ 1ltr UO  Plan: - given low degree temperature, will obtain UA, blood c/s. Low suspition for infection, we will hold off on antibiotics for now. If he continues to spike or gets more hypotensive then we will initiate - continue to wean leveophed as tolerated, MAP >65 - hold off diuresis, as he needs preload given RV infarction. Goal CVP 8-10  - if worsening CVP or sob then will give IV lasix 20mg  x1 - continue PA # and get SCVO2 in am - continue aspirin, statin  - NPO for possible CABG.  Renae Fickle, MD Cardiology.

## 2020-09-25 NOTE — ED Triage Notes (Signed)
Pt bib [facility] as CODE STEMI. Chest pain started at 0300 this morning at has gotten progressively worse. Pt rates pain 3/10 on arrival to ED. Pt took 324 mg aspirin at 0800 today. Pt hypotensive w/ EMS so did not receive nitro.

## 2020-09-26 ENCOUNTER — Inpatient Hospital Stay (HOSPITAL_COMMUNITY): Payer: No Typology Code available for payment source

## 2020-09-26 DIAGNOSIS — R570 Cardiogenic shock: Secondary | ICD-10-CM | POA: Diagnosis not present

## 2020-09-26 DIAGNOSIS — I2111 ST elevation (STEMI) myocardial infarction involving right coronary artery: Secondary | ICD-10-CM

## 2020-09-26 DIAGNOSIS — I255 Ischemic cardiomyopathy: Secondary | ICD-10-CM

## 2020-09-26 DIAGNOSIS — I340 Nonrheumatic mitral (valve) insufficiency: Secondary | ICD-10-CM | POA: Diagnosis not present

## 2020-09-26 DIAGNOSIS — I213 ST elevation (STEMI) myocardial infarction of unspecified site: Secondary | ICD-10-CM

## 2020-09-26 DIAGNOSIS — Z8673 Personal history of transient ischemic attack (TIA), and cerebral infarction without residual deficits: Secondary | ICD-10-CM

## 2020-09-26 DIAGNOSIS — I959 Hypotension, unspecified: Secondary | ICD-10-CM

## 2020-09-26 LAB — CBC
HCT: 30.9 % — ABNORMAL LOW (ref 39.0–52.0)
Hemoglobin: 9.4 g/dL — ABNORMAL LOW (ref 13.0–17.0)
MCH: 27.8 pg (ref 26.0–34.0)
MCHC: 30.4 g/dL (ref 30.0–36.0)
MCV: 91.4 fL (ref 80.0–100.0)
Platelets: 206 10*3/uL (ref 150–400)
RBC: 3.38 MIL/uL — ABNORMAL LOW (ref 4.22–5.81)
RDW: 14.9 % (ref 11.5–15.5)
WBC: 20.7 10*3/uL — ABNORMAL HIGH (ref 4.0–10.5)
nRBC: 0 % (ref 0.0–0.2)

## 2020-09-26 LAB — COOXEMETRY PANEL
Carboxyhemoglobin: 1.3 % (ref 0.5–1.5)
Methemoglobin: 0.8 % (ref 0.0–1.5)
O2 Saturation: 61.6 %
Total hemoglobin: 9.4 g/dL — ABNORMAL LOW (ref 12.0–16.0)

## 2020-09-26 LAB — BASIC METABOLIC PANEL
Anion gap: 10 (ref 5–15)
BUN: 16 mg/dL (ref 8–23)
CO2: 22 mmol/L (ref 22–32)
Calcium: 8.1 mg/dL — ABNORMAL LOW (ref 8.9–10.3)
Chloride: 105 mmol/L (ref 98–111)
Creatinine, Ser: 1.21 mg/dL (ref 0.61–1.24)
GFR, Estimated: 59 mL/min — ABNORMAL LOW (ref >60)
Glucose, Bld: 147 mg/dL — ABNORMAL HIGH (ref 70–99)
Potassium: 4 mmol/L (ref 3.5–5.1)
Sodium: 137 mmol/L (ref 135–145)

## 2020-09-26 LAB — ECHOCARDIOGRAM COMPLETE
Height: 69 in
S' Lateral: 3.8 cm
Single Plane A2C EF: 24.8 %
Single Plane A4C EF: 28.9 %
Weight: 2720 oz

## 2020-09-26 MED ORDER — FUROSEMIDE 10 MG/ML IJ SOLN
40.0000 mg | Freq: Once | INTRAMUSCULAR | Status: AC
  Administered 2020-09-26: 40 mg via INTRAVENOUS
  Filled 2020-09-26: qty 4

## 2020-09-26 MED ORDER — PANTOPRAZOLE SODIUM 40 MG PO TBEC
40.0000 mg | DELAYED_RELEASE_TABLET | Freq: Every day | ORAL | Status: DC
  Administered 2020-09-26 – 2020-09-28 (×3): 40 mg via ORAL
  Filled 2020-09-26 (×3): qty 1

## 2020-09-26 MED ORDER — HEPARIN SODIUM (PORCINE) 5000 UNIT/ML IJ SOLN
5000.0000 [IU] | Freq: Three times a day (TID) | INTRAMUSCULAR | Status: DC
  Administered 2020-09-27 – 2020-09-28 (×5): 5000 [IU] via SUBCUTANEOUS
  Filled 2020-09-26 (×5): qty 1

## 2020-09-26 MED ORDER — CHLORHEXIDINE GLUCONATE CLOTH 2 % EX PADS
6.0000 | MEDICATED_PAD | Freq: Every day | CUTANEOUS | Status: DC
  Administered 2020-09-26 – 2020-09-28 (×3): 6 via TOPICAL

## 2020-09-26 MED ORDER — POTASSIUM CHLORIDE CRYS ER 20 MEQ PO TBCR
20.0000 meq | EXTENDED_RELEASE_TABLET | Freq: Once | ORAL | Status: AC
  Administered 2020-09-26: 20 meq via ORAL
  Filled 2020-09-26: qty 1

## 2020-09-26 MED ORDER — PERFLUTREN LIPID MICROSPHERE
1.0000 mL | INTRAVENOUS | Status: AC | PRN
  Administered 2020-09-26: 2 mL via INTRAVENOUS
  Filled 2020-09-26: qty 10

## 2020-09-26 MED ORDER — DIGOXIN 125 MCG PO TABS
0.1250 mg | ORAL_TABLET | Freq: Every day | ORAL | Status: DC
  Administered 2020-09-26 – 2020-09-28 (×3): 0.125 mg via ORAL
  Filled 2020-09-26 (×3): qty 1

## 2020-09-26 MED ORDER — MILRINONE LACTATE IN DEXTROSE 20-5 MG/100ML-% IV SOLN
0.3750 ug/kg/min | INTRAVENOUS | Status: DC
  Administered 2020-09-26: 0.125 ug/kg/min via INTRAVENOUS
  Administered 2020-09-27: 0.375 ug/kg/min via INTRAVENOUS
  Administered 2020-09-27: 0.125 ug/kg/min via INTRAVENOUS
  Administered 2020-09-28 – 2020-09-29 (×3): 0.375 ug/kg/min via INTRAVENOUS
  Filled 2020-09-26 (×6): qty 100

## 2020-09-26 NOTE — Consult Note (Signed)
STROKE TEAM CONSULT NOTE  Referring Physician: Fredrich Romans, MD    Reason for Consult: Pre CABG risk assessment - recent stroke 07/29/2020  HPI: Kevin Matthews is an 86 y.o. male with a history of HLD, HTN, AAA, bilateral CEA 2016, recent pneumonia, lumbar radiculopathy (spinal stimulator - no MRI), and a strong family history of CAD, who presented to [facility] with visual disturbance and possible speech difficulties on 07/29/20.  CT head showed acute or early subacute infarct in the posterior left temporal and lateral occipital lobe.  CTA head and neck showed bilateral ICA bulb and siphon atherosclerosis but no high-grade stenosis.  However, carotid Doppler done at that time showed right ICA 70% stenosis and left ICA less than 50% stenosis.  EF 55 to 60%, LDL 76, A1c 5.8.  Discharged on DAPT, also requested 30-day cardiac event monitoring. F/U visit Dr Merlene Laughter 09/14/20, continued monotherapy with aspirin.  30-day Cardic event monitoring was started on 09/20/2020.  On 09/11/2020 the pt was admitted to [facility] by Dr C with an inferior-posterior STEMI and cardiogenic shock. He was taken to the cath lab 09/27/2020, found to have acute inferoposterior/RV infarction secondary to critical stenoses throughout the proximal and mid RCA with evidence of thrombus. The patient had TIMI-3 flow and was treated with IV Aggrastat. He was also noted to have a severe distal left main stenosis extending into the LAD and the circumflex. A Cardiothoracic Surgery consult was obtained from Dr Orvan Seen today, 09/26/2020, and CABG was recommended. A speech evaluation for dysphagia, given recent pneumonia was also recommended. A neurology consult was called for pre-op evaluation.   Past Medical History Past Medical History:  Diagnosis Date  . Arthritis   . BPH (benign prostatic hyperplasia)   . GERD (gastroesophageal reflux disease)   . Heart murmur    dr Feliz Beam  . Hypercholesteremia     Surgical History Past Surgical  History:  Procedure Laterality Date  . BACK SURGERY     x5 cervical plate;  Marland Kitchen CAROTID ENDARTERECTOMY Left   . CATARACT EXTRACTION W/PHACO Right 07/27/2015   Procedure: CATARACT EXTRACTION PHACO AND INTRAOCULAR LENS PLACEMENT (IOC);  Surgeon: Rutherford Guys, MD;  Location: [facility];  Service: Ophthalmology;  Laterality: Right;  CDE:9.43  . CATARACT EXTRACTION W/PHACO Left 08/10/2015   Procedure: CATARACT EXTRACTION PHACO AND INTRAOCULAR LENS PLACEMENT (IOC);  Surgeon: Rutherford Guys, MD;  Location: [facility];  Service: Ophthalmology;  Laterality: Left;  CDE:8.27  . LUMBAR LAMINECTOMY/DECOMPRESSION MICRODISCECTOMY N/A 01/07/2014   Procedure: Lumbar three-four, Lumbar four-five redo laminectomy and foraminotomy;  Surgeon: Ophelia Charter, MD;  Location: [facility];  Service: Neurosurgery;  Laterality: N/A;  Lumbar three-four, Lumbar four-five redo laminectomy and foraminotomy  . TONSILLECTOMY    . VASCULAR SURGERY      Family History  No family history on file.  Social History:   reports that he quit smoking about 41 years ago. His smoking use included cigarettes. He has a 64.00 pack-year smoking history. He has never used smokeless tobacco. He reports current alcohol use of about 8.0 standard drinks of alcohol per week. He reports that he does not use drugs.  Allergies:  Allergies  Allergen Reactions  . Oxycodone Other (See Comments)    States that when he took oxycodone he couldn't move out of the bed. Other reaction(s): Hallucinations    Home Medications:  Medications Prior to Admission  Medication Sig Dispense Refill  . acetaminophen (TYLENOL) 500 MG tablet Take 1,000 mg by  mouth every 6 (six) hours as needed.    . Ascorbic Acid (VITAMIN C) 500 MG CAPS Take 500 mg by mouth daily.    Marland Kitchen aspirin 325 MG tablet Take 1 tablet (325 mg total) by mouth daily. 30 tablet 0  . Cholecalciferol (VITAMIN D3) 2000 UNITS TABS Take 2,000 Units by mouth daily.    Marland Kitchen docusate sodium (COLACE) 100 MG capsule  Take 100 mg by mouth 2 (two) times daily.    . finasteride (PROSCAR) 5 MG tablet Take 5 mg by mouth daily.    Marland Kitchen gabapentin (NEURONTIN) 400 MG capsule Take 400 mg by mouth 3 (three) times daily.     . hydrALAZINE (APRESOLINE) 10 MG tablet Take 10 mg by mouth See admin instructions. Take 1/2 tablet by mouth if BP 120 plus standing, or if BP 180 plus sitting. Take 1 tablet by mouth daily as directed.    Marland Kitchen omeprazole (PRILOSEC) 20 MG capsule Take 40 mg by mouth at bedtime.     . pravastatin (PRAVACHOL) 40 MG tablet Take 80 mg by mouth daily.    Marland Kitchen rOPINIRole (REQUIP) 1 MG tablet Take 1 mg by mouth at bedtime.    . traMADol (ULTRAM) 50 MG tablet Take 50 mg by mouth 2 (two) times daily.    . cefdinir (OMNICEF) 300 MG capsule Take 1 capsule (300 mg total) by mouth every 12 (twelve) hours. (Patient not taking: Reported on 09/06/2020) 9 capsule 0  . clopidogrel (PLAVIX) 75 MG tablet Take 1 tablet (75 mg total) by mouth daily with breakfast. (Patient not taking: Reported on 09/06/2020) 30 tablet 2  . ibuprofen (ADVIL,MOTRIN) 200 MG tablet Take 600 mg by mouth daily as needed for moderate pain.  (Patient not taking: Reported on 09/13/2020)    . tamsulosin (FLOMAX) 0.4 MG CAPS capsule Take 1 capsule (0.4 mg total) by mouth daily after supper. (Patient taking differently: Take 0.4 mg by mouth 2 (two) times daily. ) 30 capsule 0    Hospital Medications . vitamin C  500 mg Oral Daily  . aspirin  324 mg Oral Once  . aspirin EC  81 mg Oral Daily  . atorvastatin  80 mg Oral Daily  . Chlorhexidine Gluconate Cloth  6 each Topical Daily  . cholecalciferol  2,000 Units Oral Daily  . digoxin  0.125 mg Oral Daily  . gabapentin  400 mg Oral TID  . [START ON 09/27/2020] heparin injection (subcutaneous)  5,000 Units Subcutaneous Q8H  . pantoprazole  40 mg Oral Daily  . rOPINIRole  1 mg Oral QHS  . sodium chloride flush  3 mL Intravenous Q12H    ROS: Review of Systems: ROS was attempted today and was able to be  performed.  Systems assessed include - Constitutional, Eyes, HENT, Respiratory, Cardiovascular, Gastrointestinal, Genitourinary, Integument/breast, Hematologic/lymphatic, Musculoskeletal, Neurological, Behavioral/Psych, Endocrine, Allergic/Immunologic - the patient complains of only the following symptoms, and all other reviewed systems are negative.   Physical Examination:   Temp:  [98.5 F (36.9 C)-101.1 F (38.4 C)] 100 F (37.8 C) (11/28 1400) Pulse Rate:  [68-97] 74 (11/28 1200) Resp:  [12-31] 25 (11/28 1400) BP: (80-144)/(51-102) 118/59 (11/28 1400) SpO2:  [91 %-100 %] 91 % (11/28 1400) Weight:  [77.1 kg] 77.1 kg (11/27 1652)  General - Well nourished, well developed, in no apparent distress.  Ophthalmologic - fundi not visualized due to noncooperation.  Cardiovascular - Regular rhythm and rate.  Mental Status -  Level of arousal and orientation to time, place, and person  were intact. Language including expression, naming, repetition, comprehension was assessed and found intact. Fund of Knowledge was assessed and was intact.  Cranial Nerves II - XII - II - Visual field intact OU. 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 V - Facial sensation intact bilaterally. VII - Facial movement intact bilaterally. VIII - Hearing & vestibular intact bilaterally. X - Palate elevates symmetrically. XI - Chin turning & shoulder shrug intact bilaterally. XII - Tongue protrusion intact.  Motor Strength - The patient's strength showed chronic LUE 4/5 proximal and distal, RUE 5/5, BLE 4/5.  Bulk was mildly decreased on the LUE and fasciculations were absent.   Motor Tone - Muscle tone was assessed at the neck and appendages and was normal.  Reflexes - The patient's reflexes were symmetrical in all extremities and he had no pathological reflexes.  Sensory -  Light touch, temperature/pinprick were assessed and were decreased on the RLE and LUE, chronic.    Coordination - The patient had normal movements in the hands with no ataxia or dysmetria.  Tremor was absent.  Gait and Station - deferred.    LABORATORY STUDIES:  Basic Metabolic Panel: Recent Labs  Lab 09/09/2020 1658 09/02/2020 1706 09/26/20 0520  NA 135 138 137  K 4.5 4.5 4.0  CL 106 106 105  CO2 21*  --  22  GLUCOSE 142* 140* 147*  BUN 18 19 16   CREATININE 1.32* 1.20 1.21  CALCIUM 8.4*  --  8.1*    Liver Function Tests: Recent Labs  Lab 09/13/2020 1658  AST 50*  ALT 19  ALKPHOS 66  BILITOT 0.8  PROT 6.0*  ALBUMIN 2.8*   No results for input(s): LIPASE, AMYLASE in the last 168 hours. No results for input(s): AMMONIA in the last 168 hours.  CBC: Recent Labs  Lab 09/05/2020 1658 09/14/2020 1706 09/28/2020 2057 09/26/20 0520  WBC 11.8*  --  20.5* 20.7*  NEUTROABS 8.9*  --   --   --   HGB 9.0* 9.5* 9.9* 9.4*  HCT 29.6* 28.0* 32.4* 30.9*  MCV 90.8  --  89.8 91.4  PLT 166  --  198 206    Cardiac Enzymes: No results for input(s): CKTOTAL, CKMB, CKMBINDEX, TROPONINI in the last 168 hours.  BNP: Invalid input(s): POCBNP  CBG: No results for input(s): GLUCAP in the last 168 hours.  Microbiology:   Coagulation Studies: Recent Labs    09/10/2020 1658  LABPROT 14.4  INR 1.2    Urinalysis: No results for input(s): COLORURINE, LABSPEC, PHURINE, GLUCOSEU, HGBUR, BILIRUBINUR, KETONESUR, PROTEINUR, UROBILINOGEN, NITRITE, LEUKOCYTESUR in the last 168 hours.  Invalid input(s): APPERANCEUR  Lipid Panel:     Component Value Date/Time   CHOL 105 09/04/2020 1658   TRIG 64 09/21/2020 1658   HDL 25 (L) 09/26/2020 1658   CHOLHDL 4.2 09/23/2020 1658   VLDL 13 09/04/2020 1658   LDLCALC 67 09/18/2020 1658    HgbA1C:  Lab Results  Component Value Date   HGBA1C 5.7 (H) 09/07/2020    Urine Drug Screen:      Component Value Date/Time   LABOPIA NONE DETECTED  07/29/2020 1400   COCAINSCRNUR NONE DETECTED 07/29/2020 1400   LABBENZ NONE DETECTED 07/29/2020 1400   AMPHETMU NONE DETECTED 07/29/2020 1400   THCU NONE DETECTED 07/29/2020 1400   LABBARB NONE DETECTED 07/29/2020 1400     Alcohol Level:  No results  for input(s): ETH in the last 168 hours.  Miscellaneous labs:  ECG - SR rate   BPM. (See cardiology reading for complete details)   IMAGING:  CARDIAC CATHETERIZATION 09/09/2020 1.  Acute inferoposterior/RV infarction secondary to critical stenoses throughout the proximal and mid RCA with evidence of thrombus.  The patient currently has TIMI-3 flow and will be treated with IV Aggrastat pending surgical evaluation.  2.  Severe distal left main stenosis extending into the LAD and the circumflex  3.  Moderately severe segmental LV dysfunction with akinesis of the entire inferior wall, preserved wall motion of the anterolateral wall  4.  Cardiogenic shock requiring Levophed 10 mcg throughout the procedure, also resuscitated with fluids.  Discussed patient's case with Dr. Orvan Seen who came in to evaluate him.  He is not a good candidate for an intra-aortic balloon pump in the setting of his thoracoabdominal pathology.  He will be treated with inotropic support and currently is stable with a systolic blood pressure of 115 mmHg on 10 mcg of Levophed alone.  We will continue Aggrastat while he undergoes surgical evaluation for consideration of high risk CABG.  Diurese as tolerated.  Use invasive hemodynamics to help manage volume status/vasopressors.  DG CHEST PORT 1 VIEW 09/26/2020 IMPRESSION:  Small left pleural effusion with suspected focus of pneumonia left base. Lungs elsewhere clear. Stable cardiac silhouette. No pneumothorax. Central catheter tip in superior vena cava. Stimulator lead tip lower thoracic region. Aortic Atherosclerosis (ICD10-I70.0).    DG CHEST PORT 1 VIEW 09/07/2020 IMPRESSION:  Mild right upper lobe atelectasis and/or  residual infiltrate, decreased in severity when compared to the prior study. E  CTA Head and Neck 07/29/20  IMPRESSION: No emergent large vessel occlusion or high-grade stenosis of the intracranial or cervical arteries. Aortic atherosclerosis (ICD10-I70.0).  CT Head WO Contrast  07/29/2020 IMPRESSION: Acute or early subacute infarct in the posterior left temporal and lateral occipital lobe. Sulcal effacement without midline shift. No evidence of hemorrhagic transformation. Consider MRI for further evaluation. (pt has spinal stimulator)  Transthoracic Echocardiogram  09/26/2020 IMPRESSIONS  1. There is no left ventricular thrombus with Definity infusion. Left  ventricular ejection fraction, by estimation, is 30 to 35%. The left  ventricle has moderately decreased function. The left ventricle  demonstrates regional wall motion abnormalities  (see scoring diagram/findings for description). Left ventricular diastolic  parameters are consistent with Grade II diastolic dysfunction  (pseudonormalization). Wall motion suggests multi vessel CAD. Many of the  area of akinesis/hypokinesis have preserved  wall thickness and show significant microbubble uptake during Definity  infusion, suggesting they are viable (particularly the inferior and  inferoseptal walls). The true apex appears thin and has poor microbubble  contrast uptake, suggesting scar.  2. Right ventricular systolic function is normal. The right ventricular  size is normal.  3. Left atrial size was mild to moderately dilated.  4. The mitral valve is normal in structure. Mild mitral valve  regurgitation.  5. The aortic valve is tricuspid. There is mild calcification of the  aortic valve. There is moderate thickening of the aortic valve. Aortic  valve regurgitation is not visualized. Mild to moderate aortic valve  sclerosis/calcification is present, without  any evidence of aortic stenosis.   Transthoracic Echocardiogram   07/30/2020 IMPRESSIONS  1. Left ventricular ejection fraction, by estimation, is 55 to 60%. The  left ventricle has normal function. The left ventricle has no regional  wall motion abnormalities. Left ventricular diastolic parameters are  indeterminate.  2. Right ventricular systolic  function is normal. The right ventricular  size is normal. Tricuspid regurgitation signal is inadequate for assessing  PA pressure.  3. The mitral valve is grossly normal. Mild mitral valve regurgitation.  4. The aortic valve is tricuspid. There is moderate calcification of the  aortic valve. Aortic valve regurgitation is not visualized. Mild to  moderate aortic valve sclerosis/calcification is present, without any  evidence of aortic stenosis.  5. The inferior vena cava is normal in size with greater than 50%  respiratory variability, suggesting right atrial pressure of 3 mmHg.    Bilateral Carotid Dopplers  07/30/2020 IMPRESSION: Right ICA stenosis estimated at greater than 70% by ultrasound criteria. Left ICA narrowing less than 50% Normal antegrade vertebral flow bilaterally   Assessment:  Kevin Matthews is a 86 y.o. male with history of HLD, Htn, AAA, bilateral CEA 2016, recent pneumonia, lumbar radiculopathy (spinal stimulator - no MRI), and a strong family history of CAD, recent stroke in 06/2020 was admitted for STEMI. Cardiac cath down with multi-vessel CAD. CABG recommended. Neurology consult for preop risk stratification.   Hx of recent stroke (2 months ago) - posterior MCA infarct - embolic - source unclear  CT head - 07/29/20 - Acute or early subacute infarct in the posterior left temporal and lateral occipital lobe  MRI head - not able to perform (spinal stimulator)  CTA H&N - 07/29/20 - No emergent large vessel occlusion or high-grade stenosis of the intracranial or cervical arteries although b/l ICA bulb and siphon atherosclerosis.  Carotid Doppler - 07/30/2020 - Right ICA  stenosis estimated at greater than 70%, and left ICA < 50% stenosis  2D echo 07/30/2020 EF 55-60%  30 day cardiac event monitoring started on 09/20/20 - pending   Lacey Jensen Virus 2 - negative  LDL 76 and A1C 5.8 (07/29/20)  Resultant - consistent visual disturbance - visual agnosia  Given discrepancy from carotid Doppler and CTA neck 2 months ago, will repeat carotid Doppler this time.  If still showing high-grade stenosis on the carotid Doppler, will repeat CT head and neck.  Will check CT head to rule out any additional silent infarct during the interval time.  STEMI / CAD  2D Echo - EF 30 - 35%, down from 55 to 60% in 07/2020.  LDL - 67  HgbA1c - 5.7  Cardiology on board  Cardiac cath showed multivessel CAD  CABG recommended  On aspirin and Plavix DAPT PTA, now on aspirin 81 and Aggrastat IV  Cardiomyopathy  2D Echo - EF 30 - 35%, down from 55 to 60% in 07/2020.  On Milrinone IV  On digoxin  Hypotension Hx of hypertension  Home BP meds: hydralazine  Current BP meds: Levophed  BP running low  . Avoid low BP given bilateral carotid stenosis  Hyperlipidemia  Home Lipid lowering medication: Pravachol 40 mg daily  LDL 67, goal < 70  Current lipid lowering medication: Lipitor 80 mg daily   Continue statin at discharge  Other Stroke Risk Factors  Advanced age  Former cigarette smoker - quit 41 years ago  ETOH use, advised to drink no more than 1 alcoholic beverage per day.  Other Active Problems  Leukocytosis - WBC's - 11.8->20.5->20.7  Low grade Fever - 100.4 - Bx cultures pending  Chronic cervical radiculopathy and lumbar sacral radiculopathy - chronic left upper extremity and right lower extremity weakness and numbness  Hospital day # 1  Rosalin Hawking, MD PhD Stroke Neurology 09/26/2020 3:30 PM

## 2020-09-26 NOTE — Progress Notes (Signed)
  Echocardiogram 2D Echocardiogram  With contrast has been performed.  Merrie Roof F 09/26/2020, 9:05 AM

## 2020-09-26 NOTE — Progress Notes (Signed)
Dr. Orvan Seen is aware and has seen the patient. He will require extensive workup prior to CABG - no plans for surgery today. He may have a diet.

## 2020-09-26 NOTE — Consult Note (Addendum)
Advanced Heart Failure Team Consult Note   Primary Physician: [facility] PCP-Cardiologist:  Sanda Klein, MD  Reason for Consultation: Acute inferior MI/cardiogenic shock  HPI:    Kevin Matthews is seen today for evaluation of cardiogenic shock at the request of Dr. Orvan Seen.   86 y.o. with history of AAA and penetrating aortic ulcer as wall as recent CVA in 9/21 was admitted with inferior STEMI and found to have severe multivessel disease.  Patient is followed mainly at [facility], has cardiologist there who has apparently been seeing him primarily for HTN.  He had CTA in 4/21 with 4.7 cm infrarenal AAA as well as a distal aorta penetrating ulcer.  In 9/21, he had a cryptogenic stroke that affected his ability to read.  This impairment has significantly improved.  He had carotid dopplers with >70% RICA stenosis but CVA was on left.  Atrial fibrillation has not been detected. He was on ASA and Plavix at home.   At baseline, patient has had difficulty with balance and weakness in his legs for "years."  He attributes this to L-spine arthritis.  He has not had chest pain prior to yesterday morning.  He would not get short of breath, just limited by leg weakness.  Echo in 10/21 showed EF 55-60%.    Patient work up yesterday morning early with severe epigastric pain.  He went to the ER, inferoposterior MI noted. He was hypotensive, norepinephrine started.  He was taken for cath yesterday, showing critical stenosis in proximal and mid RCA with thrombus, as well as 90% distal LM stenosis extending into the ostial LAD and ostial LCx.  RHC showed mean RA 15, PA 42/25, mean PCWP 27, CI 2.29.  No intervention, patient was started on Aggrastat with plan for CABG evaluation.    This morning, patient is on NE 10 with SBP 110s.  No dyspnea, he does report ongoing peri-umbilical discomfort (has been getting Fentanyl).  Echo reviewed at bedside, EF 25-30%, mild MR, normal RV size and systolic  function.  Low grade fever with Tm 100, WBCs 21.   Swan numbers: CVP 10  PA 50/22 CI 1.93 Co-ox 62% PAPI 2.8  Review of Systems: All systems reviewed and negative except as per HPI.   Home Medications Prior to Admission medications   Medication Sig Start Date End Date Taking? Authorizing Provider  acetaminophen (TYLENOL) 500 MG tablet Take 1,000 mg by mouth every 6 (six) hours as needed.   Yes [provider]  Ascorbic Acid (VITAMIN C) 500 MG CAPS Take 500 mg by mouth daily.   Yes [provider]  aspirin 325 MG tablet Take 1 tablet (325 mg total) by mouth daily. 07/31/20  Yes Barton Dubois, MD  Cholecalciferol (VITAMIN D3) 2000 UNITS TABS Take 2,000 Units by mouth daily.   Yes [provider]  docusate sodium (COLACE) 100 MG capsule Take 100 mg by mouth 2 (two) times daily.   Yes [provider]  finasteride (PROSCAR) 5 MG tablet Take 5 mg by mouth daily.   Yes [provider]  gabapentin (NEURONTIN) 400 MG capsule Take 400 mg by mouth 3 (three) times daily.    Yes [provider]  hydrALAZINE (APRESOLINE) 10 MG tablet Take 10 mg by mouth See admin instructions. Take 1/2 tablet by mouth if BP 120 plus standing, or if BP 180 plus sitting. Take 1 tablet by mouth daily as directed.   Yes [provider]  omeprazole (PRILOSEC) 20 MG  capsule Take 40 mg by mouth at bedtime.    Yes [provider]  pravastatin (PRAVACHOL) 40 MG tablet Take 80 mg by mouth daily.   Yes [provider]  rOPINIRole (REQUIP) 1 MG tablet Take 1 mg by mouth at bedtime.   Yes [provider]  traMADol (ULTRAM) 50 MG tablet Take 50 mg by mouth 2 (two) times daily.   Yes [provider]  cefdinir (OMNICEF) 300 MG capsule Take 1 capsule (300 mg total) by mouth every 12 (twelve) hours. Patient not taking: Reported on 09/20/2020 08/09/20   Orson Eva, MD  clopidogrel (PLAVIX) 75 MG tablet Take 1 tablet (75 mg total) by mouth  daily with breakfast. Patient not taking: Reported on 09/19/2020 07/31/20   Barton Dubois, MD  ibuprofen (ADVIL,MOTRIN) 200 MG tablet Take 600 mg by mouth daily as needed for moderate pain.  Patient not taking: Reported on 09/15/2020    [provider]  tamsulosin (FLOMAX) 0.4 MG CAPS capsule Take 1 capsule (0.4 mg total) by mouth daily after supper. Patient taking differently: Take 0.4 mg by mouth 2 (two) times daily.  01/15/14   Nita Sells, MD    Past Medical History: 1. HTN 2. Hyperlipidemia 3. AAA: CTA 4/21 with 4.7 cm infrarenal AA.  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  5. Penetrating ulcer distal descending aorta on CTA 4/21.  6. CVA: 9/21, cryptogenic.  Posterior left temporal and lateral occipital.  - Carotid dopplers: < 61% LICA, > 60% RICA (stroke on left though).  7. L-spine arthritis   Past Surgical History: Past Surgical History:  Procedure Laterality Date  . BACK SURGERY     x5 cervical plate;  Marland Kitchen CAROTID ENDARTERECTOMY Left   . CATARACT EXTRACTION W/PHACO Right 07/27/2015   Procedure: CATARACT EXTRACTION PHACO AND INTRAOCULAR LENS PLACEMENT (IOC);  Surgeon: Rutherford Guys, MD;  Location: [facility];  Service: Ophthalmology;  Laterality: Right;  CDE:9.43  . CATARACT EXTRACTION W/PHACO Left 08/10/2015   Procedure: CATARACT EXTRACTION PHACO AND INTRAOCULAR LENS PLACEMENT (IOC);  Surgeon: Rutherford Guys, MD;  Location: [facility];  Service: Ophthalmology;  Laterality: Left;  CDE:8.27  . LUMBAR LAMINECTOMY/DECOMPRESSION MICRODISCECTOMY N/A 01/07/2014   Procedure: Lumbar three-four, Lumbar four-five redo laminectomy and foraminotomy;  Surgeon: Ophelia Charter, MD;  Location: [place] [facility];  Service: Neurosurgery;  Laterality: N/A;  Lumbar three-four, Lumbar four-five redo laminectomy and foraminotomy  . TONSILLECTOMY    . VASCULAR SURGERY      Family History: No family history on file.  Social History: Social History   Socioeconomic History  . Marital  status: Married    Spouse name: Not on file  . Number of children: Not on file  . Years of education: Not on file  . Highest education level: Not on file  Occupational History  . Not on file  Tobacco Use  . Smoking status: Former Smoker    Packs/day: 2.00    Years: 32.00    Pack years: 64.00    Types: Cigarettes    Quit date: 07/21/1979    Years since quitting: 41.2  . Smokeless tobacco: Never Used  Substance and Sexual Activity  . Alcohol use: Yes    Alcohol/week: 8.0 standard drinks    Types: 8 Cans of beer per week    Comment: weekly  . Drug use: No  . Sexual activity: Yes    Birth control/protection: None  Other Topics Concern  . Not on file  Social History Narrative  . Not  on file   Social Determinants of Health   Financial Resource Strain:   . Difficulty of Paying Living Expenses: Not on file  Food Insecurity:   . Worried About Charity fundraiser in the Last Year: Not on file  . Ran Out of Food in the Last Year: Not on file  Transportation Needs:   . Lack of Transportation (Medical): Not on file  . Lack of Transportation (Non-Medical): Not on file  Physical Activity:   . Days of Exercise per Week: Not on file  . Minutes of Exercise per Session: Not on file  Stress:   . Feeling of Stress : Not on file  Social Connections:   . Frequency of Communication with Friends and Family: Not on file  . Frequency of Social Gatherings with Friends and Family: Not on file  . Attends Religious Services: Not on file  . Active Member of Clubs or Organizations: Not on file  . Attends Archivist Meetings: Not on file  . Marital Status: Not on file    Allergies:  Allergies  Allergen Reactions  . Oxycodone Other (See Comments)    States that when he took oxycodone he couldn't move out of the bed. Other reaction(s): Hallucinations    Objective:    Vital Signs:   Temp:  [99 F (37.2 C)-101.1 F (38.4 C)] 99.9 F (37.7 C) (11/28 0745) Pulse Rate:  [68-97] 79  (11/27 1850) Resp:  [12-31] 16 (11/28 0745) BP: (80-144)/(51-102) 111/81 (11/28 0745) SpO2:  [94 %-100 %] 94 % (11/28 0745) Weight:  [77.1 kg] 77.1 kg (11/27 1652)    Weight change: Filed Weights   08/30/2020 1652  Weight: 77.1 kg    Intake/Output:   Intake/Output Summary (Last 24 hours) at 09/26/2020 0922 Last data filed at 09/26/2020 0800 Gross per 24 hour  Intake 497.87 ml  Output 1600 ml  Net -1102.13 ml      Physical Exam    General:  Well appearing. No resp difficulty HEENT: normal Neck: supple. JVP 10 cm. Carotids 2+ bilat; no bruits. No lymphadenopathy or thyromegaly appreciated. Cor: PMI nondisplaced. Regular rate & rhythm. No rubs, gallops. 2/6 SEM RUSB.  Lungs: Mild crackles.  Abdomen: soft, nontender, nondistended. No hepatosplenomegaly. No bruits or masses. Good bowel sounds. Extremities: no cyanosis, clubbing, rash. 1+ edema to knees.  Neuro: alert & orientedx3, cranial nerves grossly intact. moves all 4 extremities w/o difficulty. Affect pleasant   Telemetry   NSR 70s (personally reviewed).   EKG    NSR, inferior STE, V2 and AL ST depression (personally reviewed)  Labs   Basic Metabolic Panel: Recent Labs  Lab 09/08/2020 1658 09/17/2020 1706 09/26/20 0520  NA 135 138 137  K 4.5 4.5 4.0  CL 106 106 105  CO2 21*  --  22  GLUCOSE 142* 140* 147*  BUN 18 19 16   CREATININE 1.32* 1.20 1.21  CALCIUM 8.4*  --  8.1*    Liver Function Tests: Recent Labs  Lab 09/18/2020 1658  AST 50*  ALT 19  ALKPHOS 66  BILITOT 0.8  PROT 6.0*  ALBUMIN 2.8*   No results for input(s): LIPASE, AMYLASE in the last 168 hours. No results for input(s): AMMONIA in the last 168 hours.  CBC: Recent Labs  Lab 09/13/2020 1658 09/07/2020 1706 09/15/2020 2057 09/26/20 0520  WBC 11.8*  --  20.5* 20.7*  NEUTROABS 8.9*  --   --   --   HGB 9.0* 9.5* 9.9* 9.4*  HCT 29.6* 28.0*  32.4* 30.9*  MCV 90.8  --  89.8 91.4  PLT 166  --  198 206    Cardiac Enzymes: No results for  input(s): CKTOTAL, CKMB, CKMBINDEX, TROPONINI in the last 168 hours.  BNP: BNP (last 3 results) No results for input(s): BNP in the last 8760 hours.  ProBNP (last 3 results) No results for input(s): PROBNP in the last 8760 hours.   CBG: No results for input(s): GLUCAP in the last 168 hours.  Coagulation Studies: Recent Labs    09/22/2020 1658  LABPROT 14.4  INR 1.2     Imaging   CARDIAC CATHETERIZATION  Result Date: 09/21/2020 1.  Acute inferoposterior/RV infarction secondary to critical stenoses throughout the proximal and mid RCA with evidence of thrombus.  The patient currently has TIMI-3 flow and will be treated with IV Aggrastat pending surgical evaluation. 2.  Severe distal left main stenosis extending into the LAD and the circumflex 3.  Moderately severe segmental LV dysfunction with akinesis of the entire inferior wall, preserved wall motion of the anterolateral wall 4.  Cardiogenic shock requiring Levophed 10 mcg throughout the procedure, also resuscitated with fluids. Discussed patient's case with Dr. Orvan Seen who came in to evaluate him.  He is not a good candidate for an intra-aortic balloon pump in the setting of his thoracoabdominal pathology.  He will be treated with inotropic support and currently is stable with a systolic blood pressure of 115 mmHg on 10 mcg of Levophed alone.  We will continue Aggrastat while he undergoes surgical evaluation for consideration of high risk CABG.  Diurese as tolerated.  Use invasive hemodynamics to help manage volume status/vasopressors.  DG CHEST PORT 1 VIEW  Result Date: 09/16/2020 CLINICAL DATA:  Shortness of breath. EXAM: PORTABLE CHEST 1 VIEW COMPARISON:  August 07, 2020 FINDINGS: Mild atelectasis and/or residual infiltrate is seen within the right upper lobe. This is decreased in severity when compared to the prior study. Mild atelectasis and/or early infiltrate is also seen within the retrocardiac region of the left lung base. A  radiopaque stimulator device is seen overlying the lateral aspect of the upper left lung. There is no evidence of a pleural effusion or pneumothorax. The heart size and mediastinal contours are within normal limits. There is moderate severity calcification of the aortic arch. A radiopaque fusion plate and screws are seen overlying the cervical spine. Stable spinal stimulator wire positioning is noted. IMPRESSION: Mild right upper lobe atelectasis and/or residual infiltrate, decreased in severity when compared to the prior study. Electronically Signed   By: Virgina Norfolk M.D.   On: 09/16/2020 20:39      Medications:     Current Medications: . vitamin C  500 mg Oral Daily  . aspirin  324 mg Oral Once  . aspirin EC  81 mg Oral Daily  . atorvastatin  80 mg Oral Daily  . Chlorhexidine Gluconate Cloth  6 each Topical Daily  . cholecalciferol  2,000 Units Oral Daily  . digoxin  0.125 mg Oral Daily  . furosemide  40 mg Intravenous Once  . gabapentin  400 mg Oral TID  . heparin injection (subcutaneous)  5,000 Units Subcutaneous Q8H  . potassium chloride  20 mEq Oral Once  . rOPINIRole  1 mg Oral QHS  . sodium chloride flush  3 mL Intravenous Q12H     Infusions: . sodium chloride 10 mL/hr at 09/26/20 0700  . milrinone    . norepinephrine (LEVOPHED) Adult infusion 10 mcg/min (09/26/20 0700)  . tirofiban  0.075 mcg/kg/min (09/26/20 0700)       Assessment/Plan   1. CAD: Inferoposterior STEMI.  LHC yesterday with serial critical stenoses in the proximal to mid RCA with thrombus, 90% distal left main stenosis extending to the ostial LAD and ostial LCx.  Patient was started on tirofiban and TCTS has evaluated for CABG.  At baseline, patient is functional, seems mainly limited by leg weakness from back arthritis.  He did have a recent CVA in 9/21, minimal residual.  I reviewed his echo today, RV function looks quite good and PAPI is 2.8 off Swan today.  I do not think he had an RV infarction.  Still with some abdominal discomfort, ?ischemic equivalent => getting Fentanyl.  - Continue tirofiban for thrombotic disease in RCA.  - Continue ASA 81, atorvastatin 80.  Plavix held for CABG evaluation.  - I would favor CABG though he will be relatively high risk with age and recent CVA.  However, at baseline he has reasonable function and I think that RV function is adequate.  I will ask neurology to evaluate him today for a pre-CABG risk assessment given recent CVA.  I think peri-operative surgical Impella would be warranted.  Surgery is following.  2. Acute systolic CHF/cardiogenic shock: Ischemic cardiomyopathy.  I reviewed echo today, EF 25-30% range with mild MR, RV systolic function looks normal.  PAPI 2.8.  Mildly elevated CVP this morning, CI 1.93 with co-ox 61% on norepinephrine 10 with SBP in 110s-120s range.   - Think we can decrease NE down to 5 for now.  - Will add low dose milrinone 0.125.  - Lasix 40 mg IV x 1 and follow response.  - Add digoxin 0.125.  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  3. CVA: Recent CVA in 9/21, had left-sided CVA with difficulty reading.  Impairment significantly improved.  Carotid dopplers at the time showed > 70% RICA stenosis but < 16% LICA stenosis, so cryptogenic CVA.  Atrial fibrillation has not been detected.  - If AF detected, will need anticoagulation.  Should likely have LINQ monitor if no AF seen this admission.  - Considering for CABG, will ask neurology to see for pre-CABG risk assessment given recent CVA.  4. Vascular disease: 4.7 cm AAA in 4/21, also with carotid stenosis as above and penetrating ulcer distal aorta. - Would repeat abdominal aorta ultrasound.  5. Asymmetric LLE swelling: Will order dopplers to rule out DVT.  6. ID: Tm 100 with WBCs 21.  Suspect inflammatory response from STEMI.  - Will order CXR.  - Blood cultures.    Length of Stay: 1  Loralie Champagne, MD  09/26/2020, 9:22 AM  Advanced Heart Failure  Team Pager [phone] (M-F; 7a - 4p)  Please contact [facility] for night-coverage after hours (4p -7a ) and weekends on [email or web address]

## 2020-09-26 NOTE — Consult Note (Signed)
TCTS Preliminary Consult Note  Pt seen and examined in cath lab last night; films reviewed with Dr. Burt Knack. Consult received for consideration of CABG. He needs an extensive work-up prior to undergoing surgery; he also needs to recover from inferior STEMI. I have asked Dr. Aundra Dubin to see him; suggest neurology consult given stroke in Sept/Oct this year.  He may also need to be assessed by SLP prior to surgery because he had aspiration PNA after stroke. These would be devastating after CABG. Other consults may be beneficial as well to ensure he is in best shape to undergo CABG.   Full consult to follow pending completion of work-up.  Sruthi Maurer Z. Orvan Seen, [place]

## 2020-09-26 NOTE — Consult Note (Signed)
NAME:  Kevin Matthews, MRN:  [id number], DOB:  June 19, 1934, LOS: 1 ADMISSION DATE:  09/17/2020, CONSULTATION DATE: 09/26/2020 REFERRING MD: Rosaria Ferries, CHIEF COMPLAINT: Preoperative optimization.  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  Coronary angiography revealed surgical disease.  He has been referred for aortocoronary bypass.  Anatomy is significant for left main involvement.  Recent history includes a CVA in September 2021 affecting his left temporal and occipital lobe leading to difficulty reading.  He denies any associated aphasia or difficulty swallowing.  He was subsequently admitted to hospital with a possible pneumonia soon after his stroke raising the possibility of aspiration which might complicate recovery from cardiac surgery.  On reviewing his admission from 07/2020 he had presented with fever and a near syncopal episode denies any shortness of breath.  There was no obvious episode of aspiration at that time.  CT did show evidence of right-sided lobar pneumonia.  He suffers from generalized weakness from spinal stenosis but manages with all of his activities of daily living.  Past Medical History   Past Medical History:  Diagnosis Date  . Arthritis   . BPH (benign prostatic hyperplasia)   . GERD (gastroesophageal reflux disease)   . Heart murmur    dr Feliz Beam  . Hypercholesteremia      Past Surgical History:  Procedure Laterality Date  . BACK SURGERY     x5 cervical plate;  Marland Kitchen CAROTID ENDARTERECTOMY Left   . CATARACT EXTRACTION W/PHACO Right 07/27/2015   Procedure: CATARACT EXTRACTION PHACO AND INTRAOCULAR LENS PLACEMENT (IOC);  Surgeon: Rutherford Guys, MD;  Location: [facility];  Service: Ophthalmology;  Laterality: Right;  CDE:9.43  . CATARACT EXTRACTION W/PHACO Left 08/10/2015   Procedure: CATARACT EXTRACTION PHACO AND INTRAOCULAR  LENS PLACEMENT (IOC);  Surgeon: Rutherford Guys, MD;  Location: [facility];  Service: Ophthalmology;  Laterality: Left;  CDE:8.27  . LUMBAR LAMINECTOMY/DECOMPRESSION MICRODISCECTOMY N/A 01/07/2014   Procedure: Lumbar three-four, Lumbar four-five redo laminectomy and foraminotomy;  Surgeon: Ophelia Charter, MD;  Location: [place] [facility];  Service: Neurosurgery;  Laterality: N/A;  Lumbar three-four, Lumbar four-five redo laminectomy and foraminotomy  . TONSILLECTOMY    . VASCULAR SURGERY       Review of Systems:   Review of Systems  Constitutional: Negative.   HENT: Negative.   Eyes: Positive for double vision (Has resolved).  Respiratory: Negative for sputum production and shortness of breath.   Cardiovascular: Positive for chest pain.  Gastrointestinal: Negative.   Genitourinary: Negative.   Musculoskeletal: Negative.   Skin: Negative.   Neurological: Positive for weakness.  Endo/Heme/Allergies: Negative.   Psychiatric/Behavioral: Negative.     Social History   reports that he quit smoking about 41 years ago. His smoking use included cigarettes. He has a 64.00 pack-year smoking history. He has never used smokeless tobacco. He reports current alcohol use of about 8.0 standard drinks of alcohol per week. He reports that he does not use drugs.   Family History   His family history is not on file.   Allergies Allergies  Allergen Reactions  . Oxycodone Other (See Comments)    States that when he took oxycodone he couldn't move out of the bed. Other reaction(s): Hallucinations     Home Medications  Prior to Admission medications   Medication Sig Start Date End Date Taking? Authorizing Provider  acetaminophen (TYLENOL) 500 MG tablet Take 1,000 mg by  mouth every 6 (six) hours as needed.   Yes [provider]  Ascorbic Acid (VITAMIN C) 500 MG CAPS Take 500 mg by mouth daily.   Yes [provider]  aspirin 325 MG tablet Take 1 tablet (325 mg total) by mouth daily. 07/31/20   Yes Barton Dubois, MD  Cholecalciferol (VITAMIN D3) 2000 UNITS TABS Take 2,000 Units by mouth daily.   Yes [provider]  docusate sodium (COLACE) 100 MG capsule Take 100 mg by mouth 2 (two) times daily.   Yes [provider]  finasteride (PROSCAR) 5 MG tablet Take 5 mg by mouth daily.   Yes [provider]  gabapentin (NEURONTIN) 400 MG capsule Take 400 mg by mouth 3 (three) times daily.    Yes [provider]  hydrALAZINE (APRESOLINE) 10 MG tablet Take 10 mg by mouth See admin instructions. Take 1/2 tablet by mouth if BP 120 plus standing, or if BP 180 plus sitting. Take 1 tablet by mouth daily as directed.   Yes [provider]  omeprazole (PRILOSEC) 20 MG capsule Take 40 mg by mouth at bedtime.    Yes [provider]  pravastatin (PRAVACHOL) 40 MG tablet Take 80 mg by mouth daily.   Yes [provider]  rOPINIRole (REQUIP) 1 MG tablet Take 1 mg by mouth at bedtime.   Yes [provider]  traMADol (ULTRAM) 50 MG tablet Take 50 mg by mouth 2 (two) times daily.   Yes [provider]  cefdinir (OMNICEF) 300 MG capsule Take 1 capsule (300 mg total) by mouth every 12 (twelve) hours. Patient not taking: Reported on 09/05/2020 08/09/20   Orson Eva, MD  clopidogrel (PLAVIX) 75 MG tablet Take 1 tablet (75 mg total) by mouth daily with breakfast. Patient not taking: Reported on 08/31/2020 07/31/20   Barton Dubois, MD  ibuprofen (ADVIL,MOTRIN) 200 MG tablet Take 600 mg by mouth daily as needed for moderate pain.  Patient not taking: Reported on 09/16/2020    [provider]  tamsulosin (FLOMAX) 0.4 MG CAPS capsule Take 1 capsule (0.4 mg total) by mouth daily after supper. Patient taking differently: Take 0.4 mg by mouth 2 (two) times daily.  01/15/14   Nita Sells, MD     Interim history/subjective:  Currently denies chest pain or shortness of breath  Objective   Blood pressure 101/63, pulse 74,  temperature 100.2 F (37.9 C), resp. rate 19, height 5\' 9"  (1.753 m), weight 77.1 kg, SpO2 97 %. PAP: (21-67)/(9-34) 42/13 CVP:  [0 mmHg-22 mmHg] 0 mmHg CO:  [3.7 L/min] 3.7 L/min CI:  [1.9 L/min/m2] 1.9 L/min/m2      Intake/Output Summary (Last 24 hours) at 09/26/2020 1337 Last data filed at 09/26/2020 1200 Gross per 24 hour  Intake 518.27 ml  Output 3000 ml  Net -2481.73 ml   Filed Weights   09/14/2020 1652  Weight: 77.1 kg    Examination: Physical Exam Constitutional:      General: He is not in acute distress. HENT:     Head: Normocephalic and atraumatic.     Nose: Nose normal.     Mouth/Throat:     Mouth: Mucous membranes are moist.  Eyes:     Pupils: Pupils are equal, round, and reactive to light.  Neck:     Vascular: No carotid bruit.  Cardiovascular:     Rate and Rhythm: Normal rate and regular rhythm.     Heart sounds: Murmur (2/6 holosystolic murmur best heard at apex) heard.  Pulmonary:     Effort: Pulmonary effort is normal.     Breath sounds: Normal breath sounds.     Comments: No tracheal tug Abdominal:     General: Abdomen is flat.     Palpations: Abdomen is soft.  Skin:    General: Skin is warm and dry.  Neurological:     General: No focal deficit present.     Mental Status: He is alert.     Cranial Nerves: No cranial nerve deficit.      Ancillary tests (personally reviewed)  CBC: Recent Labs  Lab 09/27/2020 1658 09/04/2020 1706 09/22/2020 2057 09/26/20 0520  WBC 11.8*  --  20.5* 20.7*  NEUTROABS 8.9*  --   --   --   HGB 9.0* 9.5* 9.9* 9.4*  HCT 29.6* 28.0* 32.4* 30.9*  MCV 90.8  --  89.8 91.4  PLT 166  --  198 952    Basic Metabolic Panel: Recent Labs  Lab 08/30/2020 1658 09/24/2020 1706 09/26/20 0520  NA 135 138 137  K 4.5 4.5 4.0  CL 106 106 105  CO2 21*  --  22  GLUCOSE 142* 140* 147*  BUN 18 19 16   CREATININE 1.32* 1.20 1.21  CALCIUM 8.4*  --  8.1*   GFR: Estimated Creatinine Clearance: 45.4 mL/min (by C-G formula based on  SCr of 1.21 mg/dL). Recent Labs  Lab 09/15/2020 1658 09/18/2020 2057 09/26/20 0520  WBC 11.8* 20.5* 20.7*    Liver Function Tests: Recent Labs  Lab 09/12/2020 1658  AST 50*  ALT 19  ALKPHOS 66  BILITOT 0.8  PROT 6.0*  ALBUMIN 2.8*   No results for input(s): LIPASE, AMYLASE in the last 168 hours. No results for input(s): AMMONIA in the last 168 hours.  ABG    Component Value Date/Time   TCO2 20 (L) 09/12/2020 1706   O2SAT 61.6 09/26/2020 0605     Coagulation Profile: Recent Labs  Lab 09/26/2020 1658  INR 1.2    Cardiac Enzymes: No results for input(s): CKTOTAL, CKMB, CKMBINDEX, TROPONINI in the last 168 hours.  HbA1C: Hgb A1c MFr Bld  Date/Time Value Ref Range Status  09/26/2020 04:58 PM 5.7 (H) 4.8 - 5.6 % Final    Comment:    (NOTE) Pre diabetes:          5.7%-6.4%  Diabetes:              >6.4%  Glycemic control for   <7.0% adults with diabetes   07/29/2020 03:06 PM 5.8 (H) 4.8 - 5.6 % Final    Comment:    (NOTE) Pre diabetes:          5.7%-6.4%  Diabetes:              >6.4%  Glycemic control for   <7.0% adults with diabetes     CBG: No results for input(s): GLUCAP in the last 168 hours.   Assessment & Plan:  Cardiogenic shock secondary to recent STEMI Inferior wall STEMI Ischemic cardiomyopathy with ejection fraction 25% Surgical coronary disease Recent CVA Recent pneumonia Spinal stenosis Cervical and lumbar myelopathy  Plan:  Agree the patient needs medical optimization prior to cardiopulmonary bypass surgery.  Heart failure is seeing we will optimize cardiovascular status. Recent stroke and pre-existing cerebrovascular disease place the patient at increased risk for further neurological events.  However carotid disease does not appear sufficiently critical to warrant combined bypass and carotid endarterectomy surgery. With regards to the deficits from his prior stroke it is  not clear that he had an aspiration event but rather may simply  had community-acquired pneumonia.  Would recommend a formal swallowing evaluation. There is a remote smoking history but he has no baseline respiratory symptoms although his mobility is limited due to muscular weakness.  PFTs have been ordered. Patient appears well-nourished but does not appear to have significant reserve.  Dietary consult to ensure adequate preoperative caloric and protein intake. Baseline weakness will complicate recovery from surgery.  Physiotherapy consultation for up rehabilitation to maintain adequate functional status while patient is being optimized for surgery to limit functional decline preoperatively.   Kipp Brood, MD [facility] ICU Physician [place]  Pager: [phone] Or Epic Secure Chat After hours: [phone].  09/26/2020, 1:37 PM

## 2020-09-27 ENCOUNTER — Other Ambulatory Visit: Payer: Self-pay | Admitting: *Deleted

## 2020-09-27 ENCOUNTER — Inpatient Hospital Stay (HOSPITAL_COMMUNITY): Payer: No Typology Code available for payment source

## 2020-09-27 ENCOUNTER — Encounter (HOSPITAL_COMMUNITY): Payer: Self-pay | Admitting: Cardiovascular Disease

## 2020-09-27 DIAGNOSIS — J9601 Acute respiratory failure with hypoxia: Secondary | ICD-10-CM

## 2020-09-27 DIAGNOSIS — R609 Edema, unspecified: Secondary | ICD-10-CM | POA: Diagnosis not present

## 2020-09-27 DIAGNOSIS — Z01810 Encounter for preprocedural cardiovascular examination: Secondary | ICD-10-CM | POA: Diagnosis not present

## 2020-09-27 DIAGNOSIS — I2510 Atherosclerotic heart disease of native coronary artery without angina pectoris: Secondary | ICD-10-CM

## 2020-09-27 DIAGNOSIS — J189 Pneumonia, unspecified organism: Secondary | ICD-10-CM | POA: Diagnosis not present

## 2020-09-27 DIAGNOSIS — I2111 ST elevation (STEMI) myocardial infarction involving right coronary artery: Secondary | ICD-10-CM | POA: Diagnosis not present

## 2020-09-27 DIAGNOSIS — R570 Cardiogenic shock: Secondary | ICD-10-CM | POA: Diagnosis not present

## 2020-09-27 DIAGNOSIS — R509 Fever, unspecified: Secondary | ICD-10-CM

## 2020-09-27 DIAGNOSIS — D72829 Elevated white blood cell count, unspecified: Secondary | ICD-10-CM

## 2020-09-27 DIAGNOSIS — I4891 Unspecified atrial fibrillation: Secondary | ICD-10-CM

## 2020-09-27 DIAGNOSIS — I714 Abdominal aortic aneurysm, without rupture: Secondary | ICD-10-CM

## 2020-09-27 DIAGNOSIS — I639 Cerebral infarction, unspecified: Secondary | ICD-10-CM

## 2020-09-27 LAB — GLUCOSE, CAPILLARY: Glucose-Capillary: 136 mg/dL — ABNORMAL HIGH (ref 70–99)

## 2020-09-27 LAB — CBC
HCT: 29.2 % — ABNORMAL LOW (ref 39.0–52.0)
Hemoglobin: 8.9 g/dL — ABNORMAL LOW (ref 13.0–17.0)
MCH: 27.9 pg (ref 26.0–34.0)
MCHC: 30.5 g/dL (ref 30.0–36.0)
MCV: 91.5 fL (ref 80.0–100.0)
Platelets: 197 10*3/uL (ref 150–400)
RBC: 3.19 MIL/uL — ABNORMAL LOW (ref 4.22–5.81)
RDW: 15.2 % (ref 11.5–15.5)
WBC: 16.2 10*3/uL — ABNORMAL HIGH (ref 4.0–10.5)
nRBC: 0 % (ref 0.0–0.2)

## 2020-09-27 LAB — POCT I-STAT 7, (LYTES, BLD GAS, ICA,H+H)
Acid-base deficit: 6 mmol/L — ABNORMAL HIGH (ref 0.0–2.0)
Bicarbonate: 19.5 mmol/L — ABNORMAL LOW (ref 20.0–28.0)
Calcium, Ion: 1.14 mmol/L — ABNORMAL LOW (ref 1.15–1.40)
HCT: 29 % — ABNORMAL LOW (ref 39.0–52.0)
Hemoglobin: 9.9 g/dL — ABNORMAL LOW (ref 13.0–17.0)
O2 Saturation: 98 %
Potassium: 4.5 mmol/L (ref 3.5–5.1)
Sodium: 140 mmol/L (ref 135–145)
TCO2: 21 mmol/L — ABNORMAL LOW (ref 22–32)
pCO2 arterial: 35.6 mmHg (ref 32.0–48.0)
pH, Arterial: 7.347 — ABNORMAL LOW (ref 7.350–7.450)
pO2, Arterial: 117 mmHg — ABNORMAL HIGH (ref 83.0–108.0)

## 2020-09-27 LAB — COOXEMETRY PANEL
Carboxyhemoglobin: 1.3 % (ref 0.5–1.5)
Carboxyhemoglobin: 1.3 % (ref 0.5–1.5)
Carboxyhemoglobin: 1.1 % (ref 0.5–1.5)
Methemoglobin: 1.1 % (ref 0.0–1.5)
Methemoglobin: 1 % (ref 0.0–1.5)
Methemoglobin: 0.9 % (ref 0.0–1.5)
O2 Saturation: 48.5 %
O2 Saturation: 49.8 %
O2 Saturation: 53.9 %
Total hemoglobin: 9.1 g/dL — ABNORMAL LOW (ref 12.0–16.0)
Total hemoglobin: 9.1 g/dL — ABNORMAL LOW (ref 12.0–16.0)
Total hemoglobin: 10.3 g/dL — ABNORMAL LOW (ref 12.0–16.0)

## 2020-09-27 LAB — POCT ACTIVATED CLOTTING TIME
Activated Clotting Time: 202 seconds
Activated Clotting Time: 246 seconds

## 2020-09-27 LAB — POCT I-STAT, CHEM 8
BUN: 18 mg/dL (ref 8–23)
Calcium, Ion: 1.18 mmol/L (ref 1.15–1.40)
Chloride: 106 mmol/L (ref 98–111)
Creatinine, Ser: 1.2 mg/dL (ref 0.61–1.24)
Glucose, Bld: 130 mg/dL — ABNORMAL HIGH (ref 70–99)
HCT: 26 % — ABNORMAL LOW (ref 39.0–52.0)
Hemoglobin: 8.8 g/dL — ABNORMAL LOW (ref 13.0–17.0)
Potassium: 4.5 mmol/L (ref 3.5–5.1)
Sodium: 139 mmol/L (ref 135–145)
TCO2: 20 mmol/L — ABNORMAL LOW (ref 22–32)

## 2020-09-27 LAB — POCT I-STAT EG7
Acid-base deficit: 6 mmol/L — ABNORMAL HIGH (ref 0.0–2.0)
Bicarbonate: 20.1 mmol/L (ref 20.0–28.0)
Calcium, Ion: 1.15 mmol/L (ref 1.15–1.40)
HCT: 29 % — ABNORMAL LOW (ref 39.0–52.0)
Hemoglobin: 9.9 g/dL — ABNORMAL LOW (ref 13.0–17.0)
O2 Saturation: 53 %
Potassium: 4.6 mmol/L (ref 3.5–5.1)
Sodium: 140 mmol/L (ref 135–145)
TCO2: 21 mmol/L — ABNORMAL LOW (ref 22–32)
pCO2, Ven: 39.4 mmHg — ABNORMAL LOW (ref 44.0–60.0)
pH, Ven: 7.316 (ref 7.250–7.430)
pO2, Ven: 30 mmHg — CL (ref 32.0–45.0)

## 2020-09-27 LAB — BASIC METABOLIC PANEL
Anion gap: 9 (ref 5–15)
BUN: 17 mg/dL (ref 8–23)
CO2: 24 mmol/L (ref 22–32)
Calcium: 8.3 mg/dL — ABNORMAL LOW (ref 8.9–10.3)
Chloride: 103 mmol/L (ref 98–111)
Creatinine, Ser: 1.33 mg/dL — ABNORMAL HIGH (ref 0.61–1.24)
GFR, Estimated: 53 mL/min — ABNORMAL LOW (ref >60)
Glucose, Bld: 139 mg/dL — ABNORMAL HIGH (ref 70–99)
Potassium: 3.8 mmol/L (ref 3.5–5.1)
Sodium: 136 mmol/L (ref 135–145)

## 2020-09-27 LAB — PROCALCITONIN: Procalcitonin: 3.67 ng/mL

## 2020-09-27 MED ORDER — ACETAMINOPHEN 500 MG PO TABS
500.0000 mg | ORAL_TABLET | Freq: Four times a day (QID) | ORAL | Status: DC
  Administered 2020-09-27 – 2020-09-28 (×6): 500 mg via ORAL
  Filled 2020-09-27 (×5): qty 1

## 2020-09-27 MED ORDER — SODIUM CHLORIDE 0.9 % IV SOLN
1.0000 g | INTRAVENOUS | Status: DC
  Administered 2020-09-27 – 2020-09-28 (×2): 1 g via INTRAVENOUS
  Filled 2020-09-27 (×2): qty 1
  Filled 2020-09-27: qty 10

## 2020-09-27 MED ORDER — ENSURE ENLIVE PO LIQD
237.0000 mL | Freq: Two times a day (BID) | ORAL | Status: DC
  Administered 2020-09-28 (×2): 237 mL via ORAL

## 2020-09-27 MED ORDER — IOHEXOL 350 MG/ML SOLN
75.0000 mL | Freq: Once | INTRAVENOUS | Status: AC | PRN
  Administered 2020-09-27: 75 mL via INTRAVENOUS

## 2020-09-27 MED ORDER — ADULT MULTIVITAMIN W/MINERALS CH
1.0000 | ORAL_TABLET | Freq: Every day | ORAL | Status: DC
  Administered 2020-09-27 – 2020-09-28 (×2): 1 via ORAL
  Filled 2020-09-27 (×2): qty 1

## 2020-09-27 MED ORDER — FUROSEMIDE 40 MG PO TABS
40.0000 mg | ORAL_TABLET | Freq: Every day | ORAL | Status: DC
  Administered 2020-09-27 – 2020-09-28 (×2): 40 mg via ORAL
  Filled 2020-09-27 (×2): qty 1

## 2020-09-27 MED ORDER — ALUM & MAG HYDROXIDE-SIMETH 200-200-20 MG/5ML PO SUSP
30.0000 mL | ORAL | Status: DC | PRN
  Administered 2020-09-27 – 2020-09-28 (×3): 30 mL via ORAL
  Filled 2020-09-27 (×3): qty 30

## 2020-09-27 MED ORDER — DOXYCYCLINE HYCLATE 100 MG PO TABS
100.0000 mg | ORAL_TABLET | Freq: Two times a day (BID) | ORAL | Status: DC
  Administered 2020-09-27 – 2020-09-28 (×4): 100 mg via ORAL
  Filled 2020-09-27 (×4): qty 1

## 2020-09-27 MED ORDER — TRAZODONE HCL 50 MG PO TABS
75.0000 mg | ORAL_TABLET | Freq: Every evening | ORAL | Status: DC | PRN
  Administered 2020-09-27 – 2020-09-28 (×2): 75 mg via ORAL
  Filled 2020-09-27 (×2): qty 2

## 2020-09-27 NOTE — Progress Notes (Addendum)
Patient ID: Kevin Matthews, male   DOB: 1934-08-06, 86 y.o.   MRN: [id number]     Advanced Heart Failure Rounding Note  PCP-Cardiologist: Sanda Klein, MD   Subjective:    Febrile to 101.5 yesterday pm. PCT 3.67 this morning with WBCs 16. CXR with ?PNA left base.  No cough or dyspnea.   No further chest pain.  Remains on Aggrastat.   Good diuresis yesterday with IV Lasix.  Remains on milrinone 0.125 and NE 10.   Swan numbers: CVP 9 PA 45/20 CI 2.1 Co-ox 49%   Objective:   Weight Range: 77.1 kg Body mass index is 25.1 kg/m.   Vital Signs:   Temp:  [98.5 F (36.9 C)-101.7 F (38.7 C)] 99 F (37.2 C) (11/29 0700) Pulse Rate:  [74-83] 83 (11/28 1600) Resp:  [15-33] 21 (11/29 0700) BP: (64-139)/(44-98) 85/59 (11/29 0700) SpO2:  [89 %-100 %] 91 % (11/29 0700) Last BM Date: 09/26/20  Weight change: Filed Weights   08/30/2020 1652  Weight: 77.1 kg    Intake/Output:   Intake/Output Summary (Last 24 hours) at 09/27/2020 0848 Last data filed at 09/27/2020 0606 Gross per 24 hour  Intake 898.51 ml  Output 2500 ml  Net -1601.49 ml      Physical Exam    General:  Well appearing. No resp difficulty HEENT: Normal Neck: Supple. JVP 8-9 cm. Carotids 2+ bilat; no bruits. No lymphadenopathy or thyromegaly appreciated. Cor: PMI nondisplaced. Regular rate & rhythm. No rubs, gallops or murmurs. Lungs: Clear Abdomen: Soft, nontender, nondistended. No hepatosplenomegaly. No bruits or masses. Good bowel sounds. Extremities: No cyanosis, clubbing, rash. Trace ankle edema.  Neuro: Alert & orientedx3, cranial nerves grossly intact. moves all 4 extremities w/o difficulty. Affect pleasant   Telemetry   NSR 80s-90s (personally reviewed)   Labs    CBC Recent Labs    09/19/2020 1658 09/19/2020 1706 09/26/20 0520 09/27/20 0617  WBC 11.8*   < > 20.7* 16.2*  NEUTROABS 8.9*  --   --   --   HGB 9.0*   < > 9.4* 8.9*  HCT 29.6*   < > 30.9* 29.2*  MCV 90.8   < > 91.4 91.5    PLT 166   < > 206 197   < > = values in this interval not displayed.   Basic Metabolic Panel Recent Labs    09/26/20 0520 09/27/20 0617  NA 137 136  K 4.0 3.8  CL 105 103  CO2 22 24  GLUCOSE 147* 139*  BUN 16 17  CREATININE 1.21 1.33*  CALCIUM 8.1* 8.3*   Liver Function Tests Recent Labs    09/08/2020 1658  AST 50*  ALT 19  ALKPHOS 66  BILITOT 0.8  PROT 6.0*  ALBUMIN 2.8*   No results for input(s): LIPASE, AMYLASE in the last 72 hours. Cardiac Enzymes No results for input(s): CKTOTAL, CKMB, CKMBINDEX, TROPONINI in the last 72 hours.  BNP: BNP (last 3 results) No results for input(s): BNP in the last 8760 hours.  ProBNP (last 3 results) No results for input(s): PROBNP in the last 8760 hours.   D-Dimer No results for input(s): DDIMER in the last 72 hours. Hemoglobin A1C Recent Labs    09/08/2020 1658  HGBA1C 5.7*   Fasting Lipid Panel Recent Labs    09/04/2020 1658  CHOL 105  HDL 25*  LDLCALC 67  TRIG 64  CHOLHDL 4.2   Thyroid Function Tests No results for input(s): TSH, T4TOTAL, T3FREE, THYROIDAB in the last  72 hours.  Invalid input(s): FREET3  Other results:   Imaging    US AORTA/ILIAC DOPPLER COMPLETE  Result Date: 09/27/2020 CLINICAL DATA:  Abdominal aortic aneurysm. EXAM: ULTRASOUND OF ABDOMINAL AORTA TECHNIQUE: Ultrasound examination of the abdominal aorta and proximal common iliac arteries was performed to evaluate for aneurysm. Additional color and Doppler images of the distal aorta were obtained to document patency. COMPARISON:  CTA of the abdomen and pelvis on 02/16/2020 FINDINGS: Abdominal aortic measurements as follows: Proximal:  2.6 x 2.5 cm Mid:  2.6 x 2.5 cm Distal:  4.7 x 4.9 cm The abdominal aorta demonstrates atherosclerosis. Aneurysmal segment of the abdominal aorta demonstrates mural thrombus. Patent: Yes, peak systolic velocity is 34 cm/sec Right common iliac artery: 1.3 cm Left common iliac artery: 1.6 cm IMPRESSION: Distal  abdominal aortic aneurysm measuring approximately 4.7 x 4.9 cm by ultrasound. This is fairly similar to the prior CTA measurement of 4.7 cm. Electronically Signed   By: Aletta Edouard M.D.   On: 09/27/2020 07:57   DG CHEST PORT 1 VIEW  Result Date: 09/26/2020 CLINICAL DATA:  Congestive heart failure EXAM: PORTABLE CHEST 1 VIEW COMPARISON:  September 25, 2020 FINDINGS: There is a small left pleural effusion with ill-defined airspace opacity in the left base. Interval clearing of ill-defined opacity from the right upper lobe. Right lung essentially clear at this time. Heart is upper normal in size with pulmonary vascularity normal. There is aortic atherosclerosis. Swan-Ganz catheter tip is in the right main pulmonary artery. Stimulator lead tip in lower thoracic region. Postoperative change noted in the lower cervical region. Surgical clips in each neck region noted. Monitor device overlies the left hemithorax. IMPRESSION: Small left pleural effusion with suspected focus of pneumonia left base. Lungs elsewhere clear. Stable cardiac silhouette. No pneumothorax. Central catheter tip in superior vena cava. Stimulator lead tip lower thoracic region. Aortic Atherosclerosis (ICD10-I70.0). Electronically Signed   By: Lowella Grip III M.D.   On: 09/26/2020 11:07   ECHOCARDIOGRAM COMPLETE  Result Date: 09/26/2020    ECHOCARDIOGRAM REPORT   Patient Name:   Kevin Matthews Date of Exam: 09/26/2020 Medical Rec #:  [id number]         Height:       69.0 in Accession #:    [id number]        Weight:       170.0 lb Date of Birth:  1934-10-09         BSA:          1.928 m Patient Age:    44 years          BP:           112/75 mmHg Patient Gender: M                 HR:           76 bpm. Exam Location:  Inpatient Procedure: 2D Echo, Cardiac Doppler, Color Doppler and Intracardiac            Opacification Agent Indications:    121-121.4 ST elevation (STEMI) and non-ST elevation (NSTEMI)                 nyocardial  infarction  History:        Patient has prior history of Echocardiogram examinations, most                 recent 07/30/2020. Signs/Symptoms:Chest Pain. Prior stroke.  Sonographer:    [place] Referring Phys: [phone]  MICHAEL COOPER IMPRESSIONS  1. There is no left ventricular thrombus with Definity infusion. Left ventricular ejection fraction, by estimation, is 30 to 35%. The left ventricle has moderately decreased function. The left ventricle demonstrates regional wall motion abnormalities (see scoring diagram/findings for description). Left ventricular diastolic parameters are consistent with Grade II diastolic dysfunction (pseudonormalization). Wall motion suggests multi vessel CAD. Many of the area of akinesis/hypokinesis have preserved  wall thickness and show significant microbubble uptake during Definity infusion, suggesting they are viable (particularly the inferior and inferoseptal walls). The true apex appears thin and has poor microbubble contrast uptake, suggesting scar.  2. Right ventricular systolic function is normal. The right ventricular size is normal.  3. Left atrial size was mild to moderately dilated.  4. The mitral valve is normal in structure. Mild mitral valve regurgitation.  5. The aortic valve is tricuspid. There is mild calcification of the aortic valve. There is moderate thickening of the aortic valve. Aortic valve regurgitation is not visualized. Mild to moderate aortic valve sclerosis/calcification is present, without any evidence of aortic stenosis. FINDINGS  Left Ventricle: There is no left ventricular thrombus with Definity infusion. Left ventricular ejection fraction, by estimation, is 30 to 35%. The left ventricle has moderately decreased function. The left ventricle demonstrates regional wall motion abnormalities. The left ventricular internal cavity size was normal in size. There is no left ventricular hypertrophy. Left ventricular diastolic parameters are consistent with  Grade II diastolic dysfunction (pseudonormalization). Normal left ventricular  filling pressure.  LV Wall Scoring: The mid and distal inferior wall and apex are akinetic. The mid and distal anterior septum, posterior wall, mid inferoseptal segment, and apical anterior segment are hypokinetic. The anterior wall, antero-lateral wall, basal anteroseptal segment, apical lateral segment, basal inferior segment, and basal inferoseptal segment are normal. Wall motion suggests multi vessel CAD. Many of the area of akinesis/hypokinesis have preserved wall thickness and show significant microbubble uptake during Definity infusion, suggesting they are viable (particularly the inferior and inferoseptal walls). The true apex appears thin and has poor microbubble contrast uptake, suggesting scar. Right Ventricle: The right ventricular size is normal. No increase in right ventricular wall thickness. Right ventricular systolic function is normal. Left Atrium: Left atrial size was mild to moderately dilated. Right Atrium: Right atrial size was normal in size. Pericardium: There is no evidence of pericardial effusion. Mitral Valve: The mitral valve is normal in structure. Mild mitral valve regurgitation. Tricuspid Valve: The tricuspid valve is normal in structure. Tricuspid valve regurgitation is trivial. Aortic Valve: The aortic valve is tricuspid. There is mild calcification of the aortic valve. There is moderate thickening of the aortic valve. Aortic valve regurgitation is not visualized. Mild to moderate aortic valve sclerosis/calcification is present, without any evidence of aortic stenosis. Pulmonic Valve: The pulmonic valve was not well visualized. Pulmonic valve regurgitation is not visualized. Aorta: The aortic root and ascending aorta are structurally normal, with no evidence of dilitation. IAS/Shunts: No atrial level shunt detected by color flow Doppler. Additional Comments: A venous catheter is visualized.  LEFT VENTRICLE  PLAX 2D LVIDd:         5.10 cm      Diastology LVIDs:         3.80 cm      LV e' medial:    6.53 cm/s LV PW:         0.90 cm      LV E/e' medial:  14.6 LV IVS:        0.80 cm  LV e' lateral:   5.98 cm/s LVOT diam:     2.00 cm      LV E/e' lateral: 16.0 LV SV:         46 LV SV Index:   24 LVOT Area:     3.14 cm  LV Volumes (MOD) LV vol d, MOD A2C: 104.9 ml LV vol d, MOD A4C: 139.0 ml LV vol s, MOD A2C: 78.9 ml LV vol s, MOD A4C: 98.9 ml LV SV MOD A2C:     26.0 ml LV SV MOD A4C:     139.0 ml RIGHT VENTRICLE          IVC RV Basal diam:  3.80 cm  IVC diam: 2.20 cm LEFT ATRIUM             Index       RIGHT ATRIUM           Index LA diam:        4.40 cm 2.28 cm/m  RA Area:     18.50 cm LA Vol (A2C):   83.0 ml 43.05 ml/m RA Volume:   60.40 ml  31.33 ml/m LA Vol (A4C):   52.9 ml 27.44 ml/m LA Biplane Vol: 68.3 ml 35.42 ml/m  AORTIC VALVE LVOT Vmax:   88.60 cm/s LVOT Vmean:  64.400 cm/s LVOT VTI:    0.147 m  AORTA Ao Root diam: 3.20 cm Ao Asc diam:  3.70 cm MV E velocity: 95.39 cm/s MV A velocity: 58.18 cm/s  SHUNTS MV E/A ratio:  1.64        Systemic VTI:  0.15 m                            Systemic Diam: 2.00 cm Dani Gobble Croitoru MD Electronically signed by Sanda Klein MD Signature Date/Time: 09/26/2020/10:39:22 AM    Final       Medications:     Scheduled Medications: . acetaminophen  500 mg Oral Q6H  . vitamin C  500 mg Oral Daily  . aspirin  324 mg Oral Once  . aspirin EC  81 mg Oral Daily  . atorvastatin  80 mg Oral Daily  . Chlorhexidine Gluconate Cloth  6 each Topical Daily  . cholecalciferol  2,000 Units Oral Daily  . digoxin  0.125 mg Oral Daily  . doxycycline  100 mg Oral Q12H  . gabapentin  400 mg Oral TID  . heparin injection (subcutaneous)  5,000 Units Subcutaneous Q8H  . pantoprazole  40 mg Oral Daily  . rOPINIRole  1 mg Oral QHS  . sodium chloride flush  3 mL Intravenous Q12H     Infusions: . sodium chloride 10 mL/hr at 09/26/20 0700  . cefTRIAXone (ROCEPHIN)  IV    .  milrinone 0.25 mcg/kg/min (09/27/20 0803)  . norepinephrine (LEVOPHED) Adult infusion 10 mcg/min (09/27/20 0606)  . tirofiban 0.075 mcg/kg/min (09/27/20 0757)     PRN Medications:  sodium chloride, acetaminophen, fentaNYL (SUBLIMAZE) injection, ondansetron (ZOFRAN) IV, sodium chloride flush   Assessment/Plan   1. CAD: Inferoposterior STEMI.  LHC yesterday with serial critical stenoses in the proximal to mid RCA with thrombus, 90% distal left main stenosis extending to the ostial LAD and ostial LCx.  Patient was started on tirofiban and TCTS has evaluated for CABG.  At baseline, patient is functional, seems mainly limited by leg weakness from back arthritis.  He did have a recent CVA in 9/21, minimal residual.  I reviewed  his echo, RV function looks quite good and PAPI has been adequate from [place].  I do not think he had an RV infarction. No further chest/epigastric pain. - Continue tirofiban for thrombotic disease in RCA.  - Continue ASA 81, atorvastatin 80.  Plavix held for CABG.  - I would favor CABG once he is optimized though he will be high risk with age, suspected PNA, and recent CVA as well as low cardiac output/cardiogenic shock.  However, at baseline he has reasonable function and I think that RV function is adequate.  I think peri-operative surgical Impella would be warranted.  Surgery is following.  2. Acute systolic CHF/cardiogenic shock: Ischemic cardiomyopathy.  I reviewed echo, EF 30% range with mild MR, RV systolic function looks normal.  He diuresed with IV Lasix yesterday, CVP 9 today with CI 2.1 and co-ox low at 49%.  NE was increased back to 10 with soft BP overnight (now improved) and milrinone remains 0.125. - Wean down NE as able this morning.  - Increase milrinone to 0.25 mcg/kg/min, repeat co-ox.  - Lasix 40 mg po daily.  - Digoxin 0.125.  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 As above, peri-op Impella 5.5 would be warranted.  3. CVA: Recent CVA in  9/21, had left-sided CVA with difficulty reading.  Impairment significantly improved.  Carotid dopplers at the time showed > 70% RICA stenosis but < 88% LICA stenosis, so cryptogenic CVA.  Atrial fibrillation has not been detected. He has had pre-op assessment by neurology. Ideally, would wait longer after CVA before surgery, but based on coronary anatomy and recent STEMI, he will need revascularization as soon as feasible.  - Repeat carotid dopplers per neuro.  - If AF detected, will need anticoagulation.  Consider LINQ monitor if no AF seen this admission.  4. Vascular disease: 4.9 cm AAA on [place] this admission, also with carotid stenosis as above and penetrating ulcer distal aorta. - Pending repeat carotid dopplers.  5. Asymmetric LLE swelling: Improved with diuresis.  Have ordered venous dopplers to r/o DVT but not done yet.  - - On heparin prophylaxis.  6. ID: Tm 101.5 with WBCs 16 and PCT 3.67.  Based on CXR, concern for LLL PNA.  He was treated for PNA in 10/21.  ?Recurrent aspiration.  Cultures NGTD.  - Cover CAP with ceftriaxone/doxycycline.  - Swallow evaluation.   Mobilize with PT.   CRITICAL CARE Performed by: Loralie Champagne  Total critical care time: 40 minutes  Critical care time was exclusive of separately billable procedures and treating other patients.  Critical care was necessary to treat or prevent imminent or life-threatening deterioration.  Critical care was time spent personally by me on the following activities: development of treatment plan with patient and/or surrogate as well as nursing, discussions with consultants, evaluation of patient's response to treatment, examination of patient, obtaining history from patient or surrogate, ordering and performing treatments and interventions, ordering and review of laboratory studies, ordering and review of radiographic studies, pulse oximetry and re-evaluation of patient's condition.    Length of Stay: 2  Loralie Champagne, MD    09/27/2020, 8:48 AM  Advanced Heart Failure Team Pager [phone] (M-F; 7a - 4p)  Please contact [facility] for night-coverage after hours (4p -7a ) and weekends on [email or web address]

## 2020-09-27 NOTE — Progress Notes (Signed)
Initial Nutrition Assessment  DOCUMENTATION CODES:   Not applicable  INTERVENTION:   Ensure Enlive po BID, each supplement provides 350 kcal and 20 grams of protein  MVI with minerals   NUTRITION DIAGNOSIS:   Increased nutrient needs related to acute illness (STEMI, cardiogenic shock) as evidenced by estimated needs.   GOAL:   Patient will meet greater than or equal to 90% of their needs   MONITOR:   PO intake, Supplement acceptance  REASON FOR ASSESSMENT:   Consult Assessment of nutrition requirement/status, Other (Comment) (pre-op optimization)  ASSESSMENT:   Pt with PMH of HLD, HTN, AAA, recent PNA and CVA. Admitted with inferior-posterior STEMI and cardiogenic shock. Pending CABG once cleared for surgery.  Pt alert and in chair during RD visit. Wife at bedside. Pt reports appetite is good during and prior to admission. Pt reports consuming two meals a day with some occasional snacks. This meal pattern is normal for pt. Typical intake: Breakfast: oatmeal or bacon and biscuit, Late lunch: vegetable plate from restaurant, Dinner: popcorn or cookies. Pt denies swallowing difficulty s/p CVA.   Pt reports his UBW is 165-170 lbs. Neither pt or wife report noticable weight loss. Per chart review, pt has not experienced significant change in weight.   Pt reports limited mobility d/t decrease in balance. Pt uses walker at home but is not very active. Noted pt with depletions. Suspect this is d/t lack of mobility and advanced age.   Labs reviewed: Glucose 139   Medications reviewed and include: Vitamin C, cholecalciferol, lasix, protonix, lipitor, levophed    NUTRITION - FOCUSED PHYSICAL EXAM:    Most Recent Value  Orbital Region No depletion  Upper Arm Region Moderate depletion  Thoracic and Lumbar Region No depletion  Buccal Region No depletion  Temple Region Mild depletion  Clavicle Bone Region Moderate depletion  Clavicle and Acromion Bone Region Moderate depletion   Scapular Bone Region Mild depletion  Dorsal Hand No depletion  Patellar Region Mild depletion  Anterior Thigh Region Mild depletion  Posterior Calf Region Mild depletion  Edema (RD Assessment) None  Hair Reviewed  Eyes Reviewed  Mouth Reviewed  Skin Reviewed  Nails Reviewed       Diet Order:   Diet Order            Diet Heart Room service appropriate? Yes; Fluid consistency: Thin  Diet effective now                 EDUCATION NEEDS:   Education needs have been addressed  Skin:  Skin Assessment: Reviewed RN Assessment  Last BM:  11/28  Height:   Ht Readings from Last 1 Encounters:  09/12/2020 5\' 9"  (1.753 m)    Weight:   Wt Readings from Last 1 Encounters:  09/28/2020 77.1 kg    Ideal Body Weight:  72.7 kg  BMI:  Body mass index is 25.1 kg/m.  Estimated Nutritional Needs:   Kcal:  2100-2300  Protein:  115-130 grams  Fluid:  2 L/day    Ronnald Nian, Dietetic Intern Pager: [phone] If unavailable: [phone]

## 2020-09-27 NOTE — Evaluation (Signed)
Physical Therapy Evaluation Patient Details Name: Kevin Matthews MRN: [id number] DOB: Jan 18, 1934 Today's Date: 09/27/2020   History of Present Illness  86 y.o. male with history of HTN, HLD, recent stroke in 06/2020 and recent hospitalization 07/2020 for PNA presented to OSH with c/o chest pain since 3 am this morning and found to have inferior STEMI and transferred to [facility] for further care. Admitted 11/27 taken to the cath lab 09/01/2020, found to have acute inferoposterior/RV infarction secondary to critical stenoses throughout the proximal and mid RCA with evidence of thrombus. CABG recommended   Clinical Impression  PTA pt living with wife in single story home with 1 step to enter. Pt reports he has been having decreased endurance since his CVA in 9/21 and is limited in household distance ambulation with cane. Pt is independent in bathing and dressing, wife provides iADLs. Pt is limited in safe mobility by generalized weakness and balance. Pt reports that he has been working with [facility] and thinks that he was getting a little better before he came back in the hospital. Pt is currently modA+2 for bed mobility and transfers for physical assist and line management. PT recommends resumption of HHPT to work on strength and balance at discharge. PT will continue to follow acutely.      Follow Up Recommendations Home health PT;Supervision/Assistance - 24 hour    Equipment Recommendations  None recommended by PT (has appropriate DME)    Recommendations for Other Services OT consult     Precautions / Restrictions Precautions Precautions: Other (comment) Precaution Comments: swan ganz, A-line Restrictions Weight Bearing Restrictions: No      Mobility  Bed Mobility Overal bed mobility: Needs Assistance Bed Mobility: Supine to Sit     Supine to sit: Mod assist     General bed mobility comments: modA for bringing trunk to upright with management of lines    Transfers Overall transfer  level: Needs assistance Equipment used: 2 person hand held assist Transfers: Stand Pivot Transfers;Sit to/from Stand Sit to Stand: Min assist Stand pivot transfers: Mod assist       General transfer comment: min A for power up into standing, modA for steadying with pivoting to chair, vc for hand placement and for line management         Balance Overall balance assessment: Needs assistance Sitting-balance support: Feet supported Sitting balance-Leahy Scale: Fair     Standing balance support: Bilateral upper extremity supported Standing balance-Leahy Scale: Poor Standing balance comment: requires B UE support and min A for steadying in standing                              Pertinent Vitals/Pain Pain Assessment: No/denies pain    Home Living Family/patient expects to be discharged to:: Private residence Living Arrangements: Spouse/significant other Available Help at Discharge: Family;Available 24 hours/day Type of Home: House Home Access: Stairs to enter Entrance Stairs-Rails: None Entrance Stairs-Number of Steps: 1 Home Layout: One level Home Equipment: Walker - 2 wheels;Walker - 4 wheels;Cane - single point;Grab bars - tub/shower      Prior Function Level of Independence: Needs assistance   Gait / Transfers Assistance Needed: Pt uses SPC and rollator at times for household and very short community distances  ADL's / Homemaking Assistance Needed: independent with bathing and dressing,         Hand Dominance   Dominant Hand: Left    Extremity/Trunk Assessment   Upper Extremity Assessment Upper  Extremity Assessment: Generalized weakness    Lower Extremity Assessment Lower Extremity Assessment: Generalized weakness    Cervical / Trunk Assessment Cervical / Trunk Assessment: Kyphotic  Communication   Communication: No difficulties  Cognition Arousal/Alertness: Awake/alert Behavior During Therapy: WFL for tasks assessed/performed Overall  Cognitive Status: Within Functional Limits for tasks assessed                                        General Comments General comments (skin integrity, edema, etc.): in supine 109/77, in seated 85/77, c/o of dizziness which dissipated with transfer to recliner 88/79, max noted HR 89 bpm, pt on 2L O2 via [place] on entry SaO2 95%O2, removed for transfer and maintained >95%O2, with sitting in recliner SaO2 90%O2 and supplemental O2 replaced         Assessment/Plan    PT Assessment Patient needs continued PT services  PT Problem List Decreased strength;Decreased activity tolerance;Decreased balance;Decreased mobility;Decreased coordination;Cardiopulmonary status limiting activity       PT Treatment Interventions DME instruction;Gait training;Stair training;Functional mobility training;Therapeutic activities;Therapeutic exercise;Balance training;Cognitive remediation    PT Goals (Current goals can be found in the Care Plan section)  Acute Rehab PT Goals Patient Stated Goal: get better PT Goal Formulation: With patient Time For Goal Achievement: 10/11/20 Potential to Achieve Goals: Fair    Frequency Min 3X/week    AM-PAC PT "6 Clicks" Mobility  Outcome Measure Help needed turning from your back to your side while in a flat bed without using bedrails?: None Help needed moving from lying on your back to sitting on the side of a flat bed without using bedrails?: A Lot Help needed moving to and from a bed to a chair (including a wheelchair)?: A Lot Help needed standing up from a chair using your arms (e.g., wheelchair or bedside chair)?: A Lot Help needed to walk in hospital room?: Total Help needed climbing 3-5 steps with a railing? : Total 6 Click Score: 12    End of Session   Activity Tolerance: Patient tolerated treatment well Patient left: in bed;with call bell/phone within reach;with nursing/sitter in room Nurse Communication: Mobility status PT Visit Diagnosis:  Unsteadiness on feet (R26.81);Other abnormalities of gait and mobility (R26.89);Muscle weakness (generalized) (M62.81);Difficulty in walking, not elsewhere classified (R26.2);Dizziness and giddiness (R42)    Time: 7619-5093 PT Time Calculation (min) (ACUTE ONLY): 22 min   Charges:   PT Evaluation $PT Eval Moderate Complexity: 1 Mod          Arriona Prest B. Migdalia Dk PT, DPT Acute Rehabilitation Services Pager [phone] Office [phone]   [place] 09/27/2020, 12:26 PM

## 2020-09-27 NOTE — Progress Notes (Addendum)
Patient down to CT, RN and transporter accompanying patient  Returned from [place] at 2345. Resting comfortable in bed.

## 2020-09-27 NOTE — Plan of Care (Signed)

## 2020-09-27 NOTE — Progress Notes (Signed)
STROKE TEAM PROGRESS NOTE   INTERVAL HISTORY No family at bedside. Pt eyes open on voice, orientated and neuro stable. Had carotid Doppler today, however, limited interpretation due to right side neck lines and dressings. Right ICA estimated 40-59% stenosis. Will do CT, CTA head and neck to confirm and compare with prior.   Vitals:   09/27/20 0400 09/27/20 0500 09/27/20 0600 09/27/20 0700  BP:  99/72 99/74 (!) 85/59  Pulse:      Resp: (!) 28 (!) 33 (!) 27 (!) 21  Temp: 99.7 F (37.6 C) 99.5 F (37.5 C) 99.1 F (37.3 C) 99 F (37.2 C)  TempSrc:      SpO2: 93% 91% 92% 91%  Weight:      Height:       CBC:  Recent Labs  Lab 09/05/2020 1658 09/15/2020 1706 09/26/20 0520 09/27/20 0617  WBC 11.8*   < > 20.7* 16.2*  NEUTROABS 8.9*  --   --   --   HGB 9.0*   < > 9.4* 8.9*  HCT 29.6*   < > 30.9* 29.2*  MCV 90.8   < > 91.4 91.5  PLT 166   < > 206 197   < > = values in this interval not displayed.   Basic Metabolic Panel:  Recent Labs  Lab 09/26/20 0520 09/27/20 0617  NA 137 136  K 4.0 3.8  CL 105 103  CO2 22 24  GLUCOSE 147* 139*  BUN 16 17  CREATININE 1.21 1.33*  CALCIUM 8.1* 8.3*   Lipid Panel:  Recent Labs  Lab 09/01/2020 1658  CHOL 105  TRIG 64  HDL 25*  CHOLHDL 4.2  VLDL 13  LDLCALC 67   HgbA1c:  Recent Labs  Lab 09/10/2020 1658  HGBA1C 5.7*   Urine Drug Screen: No results for input(s): LABOPIA, COCAINSCRNUR, LABBENZ, AMPHETMU, THCU, LABBARB in the last 168 hours.  Alcohol Level No results for input(s): ETH in the last 168 hours.  IMAGING past 24 hours US AORTA/ILIAC DOPPLER COMPLETE  Result Date: 09/27/2020 CLINICAL DATA:  Abdominal aortic aneurysm. EXAM: ULTRASOUND OF ABDOMINAL AORTA TECHNIQUE: Ultrasound examination of the abdominal aorta and proximal common iliac arteries was performed to evaluate for aneurysm. Additional color and Doppler images of the distal aorta were obtained to document patency. COMPARISON:  CTA of the abdomen and pelvis on  02/16/2020 FINDINGS: Abdominal aortic measurements as follows: Proximal:  2.6 x 2.5 cm Mid:  2.6 x 2.5 cm Distal:  4.7 x 4.9 cm The abdominal aorta demonstrates atherosclerosis. Aneurysmal segment of the abdominal aorta demonstrates mural thrombus. Patent: Yes, peak systolic velocity is 34 cm/sec Right common iliac artery: 1.3 cm Left common iliac artery: 1.6 cm IMPRESSION: Distal abdominal aortic aneurysm measuring approximately 4.7 x 4.9 cm by ultrasound. This is fairly similar to the prior CTA measurement of 4.7 cm. Electronically Signed   By: Aletta Edouard M.D.   On: 09/27/2020 07:57   DG CHEST PORT 1 VIEW  Result Date: 09/26/2020 CLINICAL DATA:  Congestive heart failure EXAM: PORTABLE CHEST 1 VIEW COMPARISON:  September 25, 2020 FINDINGS: There is a small left pleural effusion with ill-defined airspace opacity in the left base. Interval clearing of ill-defined opacity from the right upper lobe. Right lung essentially clear at this time. Heart is upper normal in size with pulmonary vascularity normal. There is aortic atherosclerosis. Swan-Ganz catheter tip is in the right main pulmonary artery. Stimulator lead tip in lower thoracic region. Postoperative change noted in the lower cervical region.  Surgical clips in each neck region noted. Monitor device overlies the left hemithorax. IMPRESSION: Small left pleural effusion with suspected focus of pneumonia left base. Lungs elsewhere clear. Stable cardiac silhouette. No pneumothorax. Central catheter tip in superior vena cava. Stimulator lead tip lower thoracic region. Aortic Atherosclerosis (ICD10-I70.0). Electronically Signed   By: Lowella Grip III M.D.   On: 09/26/2020 11:07   ECHOCARDIOGRAM COMPLETE  Result Date: 09/26/2020    ECHOCARDIOGRAM REPORT   Patient Name:   Kevin Matthews Date of Exam: 09/26/2020 Medical Rec #:  [id number]         Height:       69.0 in Accession #:    [id number]        Weight:       170.0 lb Date of Birth:  1934/04/25          BSA:          1.928 m Patient Age:    86 years          BP:           112/75 mmHg Patient Gender: M                 HR:           76 bpm. Exam Location:  Inpatient Procedure: 2D Echo, Cardiac Doppler, Color Doppler and Intracardiac            Opacification Agent Indications:    121-121.4 ST elevation (STEMI) and non-ST elevation (NSTEMI)                 nyocardial infarction  History:        Patient has prior history of Echocardiogram examinations, most                 recent 07/30/2020. Signs/Symptoms:Chest Pain. Prior stroke.  Sonographer:    Merrie Roof RDCS Referring Phys: [place]  1. There is no left ventricular thrombus with Definity infusion. Left ventricular ejection fraction, by estimation, is 30 to 35%. The left ventricle has moderately decreased function. The left ventricle demonstrates regional wall motion abnormalities (see scoring diagram/findings for description). Left ventricular diastolic parameters are consistent with Grade II diastolic dysfunction (pseudonormalization). Wall motion suggests multi vessel CAD. Many of the area of akinesis/hypokinesis have preserved  wall thickness and show significant microbubble uptake during Definity infusion, suggesting they are viable (particularly the inferior and inferoseptal walls). The true apex appears thin and has poor microbubble contrast uptake, suggesting scar.  2. Right ventricular systolic function is normal. The right ventricular size is normal.  3. Left atrial size was mild to moderately dilated.  4. The mitral valve is normal in structure. Mild mitral valve regurgitation.  5. The aortic valve is tricuspid. There is mild calcification of the aortic valve. There is moderate thickening of the aortic valve. Aortic valve regurgitation is not visualized. Mild to moderate aortic valve sclerosis/calcification is present, without any evidence of aortic stenosis. FINDINGS  Left Ventricle: There is no left ventricular thrombus  with Definity infusion. Left ventricular ejection fraction, by estimation, is 30 to 35%. The left ventricle has moderately decreased function. The left ventricle demonstrates regional wall motion abnormalities. The left ventricular internal cavity size was normal in size. There is no left ventricular hypertrophy. Left ventricular diastolic parameters are consistent with Grade II diastolic dysfunction (pseudonormalization). Normal left ventricular  filling pressure.  LV Wall Scoring: The mid and distal inferior wall and apex are akinetic. The mid  and distal anterior septum, posterior wall, mid inferoseptal segment, and apical anterior segment are hypokinetic. The anterior wall, antero-lateral wall, basal anteroseptal segment, apical lateral segment, basal inferior segment, and basal inferoseptal segment are normal. Wall motion suggests multi vessel CAD. Many of the area of akinesis/hypokinesis have preserved wall thickness and show significant microbubble uptake during Definity infusion, suggesting they are viable (particularly the inferior and inferoseptal walls). The true apex appears thin and has poor microbubble contrast uptake, suggesting scar. Right Ventricle: The right ventricular size is normal. No increase in right ventricular wall thickness. Right ventricular systolic function is normal. Left Atrium: Left atrial size was mild to moderately dilated. Right Atrium: Right atrial size was normal in size. Pericardium: There is no evidence of pericardial effusion. Mitral Valve: The mitral valve is normal in structure. Mild mitral valve regurgitation. Tricuspid Valve: The tricuspid valve is normal in structure. Tricuspid valve regurgitation is trivial. Aortic Valve: The aortic valve is tricuspid. There is mild calcification of the aortic valve. There is moderate thickening of the aortic valve. Aortic valve regurgitation is not visualized. Mild to moderate aortic valve sclerosis/calcification is present, without any  evidence of aortic stenosis. Pulmonic Valve: The pulmonic valve was not well visualized. Pulmonic valve regurgitation is not visualized. Aorta: The aortic root and ascending aorta are structurally normal, with no evidence of dilitation. IAS/Shunts: No atrial level shunt detected by color flow Doppler. Additional Comments: A venous catheter is visualized.  LEFT VENTRICLE PLAX 2D LVIDd:         5.10 cm      Diastology LVIDs:         3.80 cm      LV e' medial:    6.53 cm/s LV PW:         0.90 cm      LV E/e' medial:  14.6 LV IVS:        0.80 cm      LV e' lateral:   5.98 cm/s LVOT diam:     2.00 cm      LV E/e' lateral: 16.0 LV SV:         46 LV SV Index:   24 LVOT Area:     3.14 cm  LV Volumes (MOD) LV vol d, MOD A2C: 104.9 ml LV vol d, MOD A4C: 139.0 ml LV vol s, MOD A2C: 78.9 ml LV vol s, MOD A4C: 98.9 ml LV SV MOD A2C:     26.0 ml LV SV MOD A4C:     139.0 ml RIGHT VENTRICLE          IVC RV Basal diam:  3.80 cm  IVC diam: 2.20 cm LEFT ATRIUM             Index       RIGHT ATRIUM           Index LA diam:        4.40 cm 2.28 cm/m  RA Area:     18.50 cm LA Vol (A2C):   83.0 ml 43.05 ml/m RA Volume:   60.40 ml  31.33 ml/m LA Vol (A4C):   52.9 ml 27.44 ml/m LA Biplane Vol: 68.3 ml 35.42 ml/m  AORTIC VALVE LVOT Vmax:   88.60 cm/s LVOT Vmean:  64.400 cm/s LVOT VTI:    0.147 m  AORTA Ao Root diam: 3.20 cm Ao Asc diam:  3.70 cm MV E velocity: 95.39 cm/s MV A velocity: 58.18 cm/s  SHUNTS MV E/A ratio:  1.64  Systemic VTI:  0.15 m                            Systemic Diam: 2.00 cm Mihai Croitoru MD Electronically signed by Sanda Klein MD Signature Date/Time: 09/26/2020/10:39:22 AM    Final     PHYSICAL EXAM BP 109/65   Pulse 83   Temp 98.6 F (37 C)   Resp 18   Ht 5\' 9"  (1.753 m)   Wt 77.1 kg   SpO2 97%   BMI 25.10 kg/m   General - Well nourished, well developed, in no apparent distress.  Ophthalmologic - fundi not visualized due to noncooperation.  Cardiovascular - Regular rhythm and  rate.  Mental Status -  Level of arousal and orientation to time, place, and person were intact. Language including expression, naming, repetition, comprehension was assessed and found intact. Fund of Knowledge was assessed and was intact.  Cranial Nerves II - XII - II - Visual field intact OU. 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 V - Facial sensation intact bilaterally. VII - Facial movement intact bilaterally. VIII - Hearing & vestibular intact bilaterally. X - Palate elevates symmetrically. XI - Chin turning & shoulder shrug intact bilaterally. XII - Tongue protrusion intact.  Motor Strength - The patient's strength showed chronic LUE 4/5 proximal and distal, RUE 5/5, BLE 4/5.  Bulk was mildly decreased on the LUE and fasciculations were absent.   Motor Tone - Muscle tone was assessed at the neck and appendages and was normal.  Reflexes - The patient's reflexes were symmetrical in all extremities and he had no pathological reflexes.  Sensory - Light touch, temperature/pinprick were assessed and were decreased on the RLE and LUE, chronic.    Coordination - The patient had normal movements in the hands with no ataxia or dysmetria.  Tremor was absent.  Gait and Station - deferred.   ASSESSMENT/PLAN Mr. Kevin Matthews is a 86 y.o. male with history of HLD, Htn, AAA, bilateral CEA 2016, recent pneumonia, lumbar radiculopathy spinal stimulator - no MRI), and a strong family history of CAD, recent stroke in 06/2020 was admitted for STEMI. Cardiac cath down with multi-vessel CAD. CABG recommended. Neurology consult for preop risk stratification.   Hx of recent stroke (2 months ago) - posterior MCA infarct - embolic - source unclear  CT head - 07/29/20 - Acute or early subacute infarct in the posterior left temporal and lateral occipital  lobe  MRI head - not able to perform (spinal stimulator)  CTA H&N - 07/29/20 - No emergent large vessel occlusion or high-grade stenosis of the intracranial or cervical arteries although b/l ICA bulb and siphon atherosclerosis.  Carotid Doppler - 07/30/2020 - Right ICA stenosis estimated at greater than 70%, and left ICA < 50% stenosis  2D echo 07/30/2020 EF 55-60%  Repeat 2D 11/29 EF 30-35%. LA mild to moderate dilated.  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  On Milrinone IV  On digoxin  Hypotension Hx of hypertension  Home BP meds: hydralazine  Current BP meds: Levophed  BP running low   Avoid low BP given bilateral carotid stenosis  Fever and leukocytosis  WBC's - 11.8->20.5->20.7->16.2  Tmax 101.7 -> 100.4   Bx cultures no growth < 24h.   On ceftriaxone + doxycyline  Hyperlipidemia  Home Lipid lowering medication: Pravachol 40 mg daily  LDL 67, goal < 70  Current lipid lowering medication: Lipitor 80 mg daily   Continue statin at discharge  Other Stroke Risk Factors  Advanced age  Former  cigarette smoker - quit 41 years ago  ETOH use, advised to drink no more than 1 alcoholic beverage per day.  Other Active Problems  Chronic cervical radiculopathy and lumbar sacral radiculopathy - chronic left upper extremity and right lower extremity weakness and numbness  Hospital day # 2  Rosalin Hawking, MD PhD Stroke Neurology 09/27/2020 6:31 PM  To contact Stroke Continuity provider, please refer to [email or web address] After hours, contact General Neurology

## 2020-09-27 NOTE — Consult Note (Signed)
NAME:  Kevin Matthews, MRN:  [id number], DOB:  1934/10/23, LOS: 2 ADMISSION DATE:  09/13/2020, CONSULTATION DATE: 09/26/2020 REFERRING MD: Rosaria Ferries, CHIEF COMPLAINT: Preoperative optimization.  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  Coronary angiography revealed surgical disease.  He has been referred for aortocoronary bypass.  Anatomy is significant for left main involvement.  Recent history includes a CVA in September 2021 affecting his left temporal and occipital lobe leading to difficulty reading.  He denies any associated aphasia or difficulty swallowing.  He was subsequently admitted to hospital with a possible pneumonia soon after his stroke raising the possibility of aspiration which might complicate recovery from cardiac surgery.  On reviewing his admission from 07/2020 he had presented with fever and a near syncopal episode denies any shortness of breath.  There was no obvious episode of aspiration at that time.  CT did show evidence of right-sided lobar pneumonia.  He suffers from generalized weakness from spinal stenosis but manages with all of his activities of daily living.  Past Medical History   Past Medical History:  Diagnosis Date  . Arthritis   . BPH (benign prostatic hyperplasia)   . GERD (gastroesophageal reflux disease)   . Heart murmur    dr Feliz Beam  . Hypercholesteremia      Past Surgical History:  Procedure Laterality Date  . BACK SURGERY     x5 cervical plate;  Marland Kitchen CAROTID ENDARTERECTOMY Left   . CATARACT EXTRACTION W/PHACO Right 07/27/2015   Procedure: CATARACT EXTRACTION PHACO AND INTRAOCULAR LENS PLACEMENT (IOC);  Surgeon: Rutherford Guys, MD;  Location: [facility];  Service: Ophthalmology;  Laterality: Right;  CDE:9.43  . CATARACT EXTRACTION W/PHACO Left 08/10/2015   Procedure: CATARACT EXTRACTION PHACO AND INTRAOCULAR  LENS PLACEMENT (IOC);  Surgeon: Rutherford Guys, MD;  Location: [facility];  Service: Ophthalmology;  Laterality: Left;  CDE:8.27  . LUMBAR LAMINECTOMY/DECOMPRESSION MICRODISCECTOMY N/A 01/07/2014   Procedure: Lumbar three-four, Lumbar four-five redo laminectomy and foraminotomy;  Surgeon: Ophelia Charter, MD;  Location: [place] [facility];  Service: Neurosurgery;  Laterality: N/A;  Lumbar three-four, Lumbar four-five redo laminectomy and foraminotomy  . RIGHT/LEFT HEART CATH AND CORONARY ANGIOGRAPHY N/A 09/12/2020   Procedure: RIGHT/LEFT HEART CATH AND CORONARY ANGIOGRAPHY;  Surgeon: Sherren Mocha, MD;  Location: [place] [facility];  Service: Cardiovascular;  Laterality: N/A;  . TONSILLECTOMY    . VASCULAR SURGERY       Micro:  11/27 blood cultures> 11/28 blood cultures>  Antibiotics:   Ceftriaxone 11/29> Doxycycline 11/29>  Interim history/subjective:  He denies complaints. No coughing or sputum production. Febrile overnight.  Objective   Blood pressure (!) 85/59, pulse 83, temperature 99 F (37.2 C), resp. rate (!) 21, height 5\' 9"  (1.753 m), weight 77.1 kg, SpO2 91 %. PAP: (21-53)/(9-28) 45/20 CVP:  [0 mmHg-13 mmHg] 8 mmHg CO:  [3.7 L/min-5.3 L/min] 5.3 L/min CI:  [1.9 L/min/m2-2.8 L/min/m2] 2.8 L/min/m2      Intake/Output Summary (Last 24 hours) at 09/27/2020 0820 Last data filed at 09/27/2020 0606 Gross per 24 hour  Intake 898.51 ml  Output 2500 ml  Net -1601.49 ml   Filed Weights   09/13/2020 1652  Weight: 77.1 kg    Examination: General: Ill-appearing man sitting up in bed in no acute distress HEENT: [place]/AT, eyes anicteric Neck: RIJ introducer sheath Cardiac: S1-S2, regular rate and rhythm Resp: Breathing comfortably nasal cannula, faint basilar  rales.  No accessory muscle use or tachypnea. Abd: Soft, nontender, nondistended Extremities: No clubbing, cyanosis, or edema Derm: Warm, dry Neuro: Awake and alert, answering questions appropriately.  Normal speech.   CXR  11/28 personally reviewed> left basilar infiltrate versus effusion versus atelectasis, right lower lobe opacity.   Assessment & Plan:  Cardiogenic shock secondary to ICN Inferior wall STEMI Ischemic cardiomyopathy with ejection fraction 25% Multivessel CAD -Continue dual antiplatelet therapy-Aggrastat and aspirin -Continue milrinone-dose increased today -Continue norepinephrine -Continue atorvastatin -Continue digoxin -Ongoing evaluation for possible CABG -Aggressively treat fevers -Optimize nutrition, medical management for stroke, pneumonia prior to OR  Recent CVA in Sept 2021 Carotid stenosis -Appreciate neurology's assistance -Statin and antiplatelet therapy  RLL pneumonia -Doxycycline and ceftriaxone to complete 7 days -Sputum culture if able to produce one  Spinal stenosis Cervical and lumbar myelopathy -PT, OT when stable  CKD 3A -Renally dose meds, avoid nephrotoxic meds -Strict I's/O  Hyperglycemia -Sliding scale insulin as needed -Goal BG 140-180 while admitted to the ICU   Ancillary tests (personally reviewed)  CBC: Recent Labs  Lab 09/09/2020 1658 09/11/2020 1706 09/12/2020 2057 09/26/20 0520 09/27/20 0617  WBC 11.8*  --  20.5* 20.7* 16.2*  NEUTROABS 8.9*  --   --   --   --   HGB 9.0* 9.5* 9.9* 9.4* 8.9*  HCT 29.6* 28.0* 32.4* 30.9* 29.2*  MCV 90.8  --  89.8 91.4 91.5  PLT 166  --  198 206 245    Basic Metabolic Panel: Recent Labs  Lab 09/11/2020 1658 08/30/2020 1706 09/26/20 0520 09/27/20 0617  NA 135 138 137 136  K 4.5 4.5 4.0 3.8  CL 106 106 105 103  CO2 21*  --  22 24  GLUCOSE 142* 140* 147* 139*  BUN 18 19 16 17   CREATININE 1.32* 1.20 1.21 1.33*  CALCIUM 8.4*  --  8.1* 8.3*   GFR: Estimated Creatinine Clearance: 41.3 mL/min (A) (by C-G formula based on SCr of 1.33 mg/dL (H)). Recent Labs  Lab 09/15/2020 1658 09/01/2020 2057 09/26/20 0520 09/27/20 0617  WBC 11.8* 20.5* 20.7* 16.2*    Liver Function Tests: Recent Labs  Lab  09/03/2020 1658  AST 50*  ALT 19  ALKPHOS 66  BILITOT 0.8  PROT 6.0*  ALBUMIN 2.8*   No results for input(s): LIPASE, AMYLASE in the last 168 hours. No results for input(s): AMMONIA in the last 168 hours.  ABG    Component Value Date/Time   TCO2 20 (L) 09/16/2020 1706   O2SAT 48.5 09/27/2020 0617     Coagulation Profile: Recent Labs  Lab 08/31/2020 1658  INR 1.2    Cardiac Enzymes: No results for input(s): CKTOTAL, CKMB, CKMBINDEX, TROPONINI in the last 168 hours.  HbA1C: Hgb A1c MFr Bld  Date/Time Value Ref Range Status  09/11/2020 04:58 PM 5.7 (H) 4.8 - 5.6 % Final    Comment:    (NOTE) Pre diabetes:          5.7%-6.4%  Diabetes:              >6.4%  Glycemic control for   <7.0% adults with diabetes   07/29/2020 03:06 PM 5.8 (H) 4.8 - 5.6 % Final    Comment:    (NOTE) Pre diabetes:          5.7%-6.4%  Diabetes:              >6.4%  Glycemic control for   <7.0% adults with diabetes     CBG: No  results for input(s): GLUCAP in the last 168 hours.    Julian Hy, DO 09/27/20 8:22 AM [facility]

## 2020-09-27 NOTE — Progress Notes (Signed)
Pre-CABG Dopplers and left lower extremity venous duplex completed. Refer to "CV Proc" under chart review to view preliminary results.  09/27/2020 3:44 PM Kelby Aline., MHA, RVT, RDCS, RDMS

## 2020-09-27 NOTE — Evaluation (Signed)
Clinical/Bedside Swallow Evaluation Patient Details  Name: Kevin Matthews MRN: [id number] Date of Birth: [birth year]  Today's Date: 09/27/2020 Time: SLP Start Time (ACUTE ONLY): 0857 SLP Stop Time (ACUTE ONLY): 0907 SLP Time Calculation (min) (ACUTE ONLY): 10 min  Past Medical History:  Past Medical History:  Diagnosis Date  . Arthritis   . BPH (benign prostatic hyperplasia)   . GERD (gastroesophageal reflux disease)   . Heart murmur    dr Feliz Beam  . Hypercholesteremia    Past Surgical History:  Past Surgical History:  Procedure Laterality Date  . BACK SURGERY     x5 cervical plate;  Marland Kitchen CAROTID ENDARTERECTOMY Left   . CATARACT EXTRACTION W/PHACO Right 07/27/2015   Procedure: CATARACT EXTRACTION PHACO AND INTRAOCULAR LENS PLACEMENT (IOC);  Surgeon: Rutherford Guys, MD;  Location: [facility];  Service: Ophthalmology;  Laterality: Right;  CDE:9.43  . CATARACT EXTRACTION W/PHACO Left 08/10/2015   Procedure: CATARACT EXTRACTION PHACO AND INTRAOCULAR LENS PLACEMENT (IOC);  Surgeon: Rutherford Guys, MD;  Location: [facility];  Service: Ophthalmology;  Laterality: Left;  CDE:8.27  . LUMBAR LAMINECTOMY/DECOMPRESSION MICRODISCECTOMY N/A 01/07/2014   Procedure: Lumbar three-four, Lumbar four-five redo laminectomy and foraminotomy;  Surgeon: Ophelia Charter, MD;  Location: [place] [facility];  Service: Neurosurgery;  Laterality: N/A;  Lumbar three-four, Lumbar four-five redo laminectomy and foraminotomy  . RIGHT/LEFT HEART CATH AND CORONARY ANGIOGRAPHY N/A 09/04/2020   Procedure: RIGHT/LEFT HEART CATH AND CORONARY ANGIOGRAPHY;  Surgeon: Sherren Mocha, MD;  Location: [place] [facility];  Service: Cardiovascular;  Laterality: N/A;  . TONSILLECTOMY    . VASCULAR SURGERY     HPI:  Kevin Matthews is a 86 y.o. male with history of HTN, HLD, recent L temporal-occipital CVA in 06/2020 and recent hospitalization 07/2020 for PNA. Admitted with inferior wall STEMI and found to have severe multivessel disease.  CXR 11/19: Small left pleural effusion with suspected focus of pneumonia left base. MD suspects community-acquired PNA vs aspiration PNA.    Assessment / Plan / Recommendation Clinical Impression  Pt was seen for BSE. The pt and his wife report no concerns or history of dysphagia. He was alert and cooperative during the evaluation. Pt tolerated thin liquids, puree, and solids with the appearance of a functional swallow. No s/sx of aspiration were observed. Recommend regular diet and thin liquids. SLP will sign off at this time.   SLP Visit Diagnosis: Dysphagia, unspecified (R13.10)    Aspiration Risk  Mild aspiration risk    Diet Recommendation Regular;Thin liquid   Liquid Administration via: Cup;Straw Medication Administration: Whole meds with liquid Supervision: Patient able to self feed Postural Changes: Seated upright at 90 degrees    Other  Recommendations Oral Care Recommendations: Oral care BID   Follow up Recommendations None      Frequency and Duration            Prognosis        Swallow Study   General HPI: Kevin Matthews is a 86 y.o. male with history of HTN, HLD, recent L temporal-occipital CVA in 06/2020 and recent hospitalization 07/2020 for PNA. Admitted with inferior wall STEMI and found to have severe multivessel disease. CXR 11/19: Small left pleural effusion with suspected focus of pneumonia left base. MD suspects community-acquired PNA vs aspiration PNA.  Type of Study: Bedside Swallow Evaluation Diet Prior to this Study: Regular;Thin liquids Respiratory Status: Room air History of Recent Intubation: No Behavior/Cognition: Alert;Cooperative;Pleasant mood Oral Cavity Assessment: Within Functional Limits Oral  Care Completed by SLP: No Oral Cavity - Dentition: Adequate natural dentition Vision: Functional for self-feeding Self-Feeding Abilities: Able to feed self Patient Positioning: Upright in bed Baseline Vocal Quality: Normal    Oral/Motor/Sensory  Function Overall Oral Motor/Sensory Function: Within functional limits   Ice Chips Ice chips: Not tested   Thin Liquid Thin Liquid: Within functional limits    Nectar Thick Nectar Thick Liquid: Not tested   Honey Thick Honey Thick Liquid: Not tested   Puree Puree: Within functional limits   Solid     Solid: Within functional limits      Greggory Keen 09/27/2020,9:16 AM

## 2020-09-28 ENCOUNTER — Encounter (HOSPITAL_COMMUNITY): Payer: Self-pay | Admitting: Cardiology

## 2020-09-28 ENCOUNTER — Other Ambulatory Visit: Payer: Self-pay

## 2020-09-28 DIAGNOSIS — I6523 Occlusion and stenosis of bilateral carotid arteries: Secondary | ICD-10-CM | POA: Diagnosis not present

## 2020-09-28 DIAGNOSIS — E7849 Other hyperlipidemia: Secondary | ICD-10-CM | POA: Diagnosis not present

## 2020-09-28 DIAGNOSIS — I2510 Atherosclerotic heart disease of native coronary artery without angina pectoris: Secondary | ICD-10-CM

## 2020-09-28 DIAGNOSIS — G8929 Other chronic pain: Secondary | ICD-10-CM | POA: Diagnosis not present

## 2020-09-28 DIAGNOSIS — R570 Cardiogenic shock: Secondary | ICD-10-CM | POA: Diagnosis not present

## 2020-09-28 DIAGNOSIS — F4542 Pain disorder with related psychological factors: Secondary | ICD-10-CM | POA: Diagnosis not present

## 2020-09-28 DIAGNOSIS — J189 Pneumonia, unspecified organism: Secondary | ICD-10-CM | POA: Diagnosis not present

## 2020-09-28 DIAGNOSIS — I2111 ST elevation (STEMI) myocardial infarction involving right coronary artery: Secondary | ICD-10-CM | POA: Diagnosis not present

## 2020-09-28 DIAGNOSIS — I5023 Acute on chronic systolic (congestive) heart failure: Secondary | ICD-10-CM

## 2020-09-28 LAB — CBC
HCT: 26.5 % — ABNORMAL LOW (ref 39.0–52.0)
HCT: 26.2 % — ABNORMAL LOW (ref 39.0–52.0)
Hemoglobin: 8.2 g/dL — ABNORMAL LOW (ref 13.0–17.0)
Hemoglobin: 8.1 g/dL — ABNORMAL LOW (ref 13.0–17.0)
MCH: 28.1 pg (ref 26.0–34.0)
MCH: 27.8 pg (ref 26.0–34.0)
MCHC: 30.9 g/dL (ref 30.0–36.0)
MCHC: 30.9 g/dL (ref 30.0–36.0)
MCV: 90.8 fL (ref 80.0–100.0)
MCV: 90 fL (ref 80.0–100.0)
Platelets: 186 10*3/uL (ref 150–400)
Platelets: 246 10*3/uL (ref 150–400)
RBC: 2.92 MIL/uL — ABNORMAL LOW (ref 4.22–5.81)
RBC: 2.91 MIL/uL — ABNORMAL LOW (ref 4.22–5.81)
RDW: 15 % (ref 11.5–15.5)
RDW: 14.9 % (ref 11.5–15.5)
WBC: 10.7 10*3/uL — ABNORMAL HIGH (ref 4.0–10.5)
WBC: 11.3 10*3/uL — ABNORMAL HIGH (ref 4.0–10.5)
nRBC: 0 % (ref 0.0–0.2)
nRBC: 0 % (ref 0.0–0.2)

## 2020-09-28 LAB — COMPREHENSIVE METABOLIC PANEL
ALT: 18 U/L (ref 0–44)
ALT: 22 U/L (ref 0–44)
AST: 47 U/L — ABNORMAL HIGH (ref 15–41)
AST: 63 U/L — ABNORMAL HIGH (ref 15–41)
Albumin: 2.3 g/dL — ABNORMAL LOW (ref 3.5–5.0)
Albumin: 2.4 g/dL — ABNORMAL LOW (ref 3.5–5.0)
Alkaline Phosphatase: 61 U/L (ref 38–126)
Alkaline Phosphatase: 63 U/L (ref 38–126)
Anion gap: 11 (ref 5–15)
Anion gap: 11 (ref 5–15)
BUN: 16 mg/dL (ref 8–23)
BUN: 21 mg/dL (ref 8–23)
CO2: 25 mmol/L (ref 22–32)
CO2: 22 mmol/L (ref 22–32)
Calcium: 8.3 mg/dL — ABNORMAL LOW (ref 8.9–10.3)
Calcium: 8.3 mg/dL — ABNORMAL LOW (ref 8.9–10.3)
Chloride: 99 mmol/L (ref 98–111)
Chloride: 100 mmol/L (ref 98–111)
Creatinine, Ser: 1.22 mg/dL (ref 0.61–1.24)
Creatinine, Ser: 1.2 mg/dL (ref 0.61–1.24)
GFR, Estimated: 58 mL/min — ABNORMAL LOW (ref >60)
GFR, Estimated: 60 mL/min — ABNORMAL LOW (ref >60)
Glucose, Bld: 125 mg/dL — ABNORMAL HIGH (ref 70–99)
Glucose, Bld: 206 mg/dL — ABNORMAL HIGH (ref 70–99)
Potassium: 3.6 mmol/L (ref 3.5–5.1)
Potassium: 4 mmol/L (ref 3.5–5.1)
Sodium: 135 mmol/L (ref 135–145)
Sodium: 133 mmol/L — ABNORMAL LOW (ref 135–145)
Total Bilirubin: 0.9 mg/dL (ref 0.3–1.2)
Total Bilirubin: 0.7 mg/dL (ref 0.3–1.2)
Total Protein: 5.7 g/dL — ABNORMAL LOW (ref 6.5–8.1)
Total Protein: 6.1 g/dL — ABNORMAL LOW (ref 6.5–8.1)

## 2020-09-28 LAB — COOXEMETRY PANEL
Carboxyhemoglobin: 1.4 % (ref 0.5–1.5)
Carboxyhemoglobin: 1.1 % (ref 0.5–1.5)
Methemoglobin: 1 % (ref 0.0–1.5)
Methemoglobin: 0.9 % (ref 0.0–1.5)
O2 Saturation: 51.3 %
O2 Saturation: 42.4 %
Total hemoglobin: 8.4 g/dL — ABNORMAL LOW (ref 12.0–16.0)
Total hemoglobin: 11.4 g/dL — ABNORMAL LOW (ref 12.0–16.0)

## 2020-09-28 LAB — OCCULT BLOOD X 1 CARD TO LAB, STOOL: Fecal Occult Bld: POSITIVE — AB

## 2020-09-28 MED ORDER — SODIUM CHLORIDE 0.9% FLUSH: 10.0000 mL | INTRAVENOUS | Status: DC | PRN

## 2020-09-28 MED ORDER — POTASSIUM CHLORIDE CRYS ER 20 MEQ PO TBCR
40.0000 meq | EXTENDED_RELEASE_TABLET | Freq: Once | ORAL | Status: AC
  Administered 2020-09-28: 40 meq via ORAL
  Filled 2020-09-28: qty 2

## 2020-09-28 MED ORDER — PANTOPRAZOLE SODIUM 40 MG IV SOLR
40.0000 mg | Freq: Once | INTRAVENOUS | Status: AC
  Administered 2020-09-28: 40 mg via INTRAVENOUS
  Filled 2020-09-28: qty 40

## 2020-09-28 MED ORDER — ALTEPLASE 2 MG IJ SOLR
2.0000 mg | Freq: Once | INTRAMUSCULAR | Status: AC
  Administered 2020-09-28: 2 mg
  Filled 2020-09-28: qty 2

## 2020-09-28 MED ORDER — SODIUM CHLORIDE 0.9% FLUSH
10.0000 mL | Freq: Two times a day (BID) | INTRAVENOUS | Status: DC
  Administered 2020-09-28 (×2): 10 mL

## 2020-09-28 NOTE — Evaluation (Signed)
Occupational Therapy Evaluation Patient Details Name: Kevin Matthews MRN: [id number] DOB: 10-Jul-1934 Today's Date: 09/28/2020    History of Present Illness 86 y.o. male with history of HTN, HLD, recent stroke in 06/2020 and recent hospitalization 07/2020 for PNA presented to OSH with c/o chest pain since 3 am this morning and found to have inferior STEMI and transferred to [facility] for further care. Admitted 11/27 taken to the cath lab 09/07/2020, found to have acute inferoposterior/RV infarction secondary to critical stenoses throughout the proximal and mid RCA with evidence of thrombus. CABG recommended    Clinical Impression   PTA, pt was living with his wife and was independent with BADLs and used SPC for mobility. Pt currently requiring Min for UB ADLs, Mod-Max A for LB ADLs, and Mod A +2 for functional transfers. Pt presenting with poor cognition, balance, strength, and activity tolerance. Facilitating trunk exercises for optimizing posture in preparation for standing; x3. Pt would benefit from further acute OT to facilitate safe dc. Due to pt's change in function, good family support, and motivation, recommend dc to CIR for further OT to optimize safety, independence with ADLs, and return to PLOF as well as decrease caregiver burden.     Follow Up Recommendations  CIR;Supervision/Assistance - 24 hour    Equipment Recommendations  3 in 1 bedside commode    Recommendations for Other Services PT consult     Precautions / Restrictions Precautions Precautions: Other (comment) Precaution Comments: swan ganz, A-line      Mobility Bed Mobility               General bed mobility comments: Sitting in recliner upon arrival    Transfers Overall transfer level: Needs assistance Equipment used: Rolling walker (2 wheeled) Transfers: Sit to/from Stand Sit to Stand: Mod assist;+2 physical assistance;+2 safety/equipment         General transfer comment: Min A +2 to power up and then  Mod A +2 for maintaining balance in standing.     Balance Overall balance assessment: Needs assistance Sitting-balance support: Feet supported Sitting balance-Leahy Scale: Fair     Standing balance support: Bilateral upper extremity supported Standing balance-Leahy Scale: Poor Standing balance comment: requires B UE support and min A for steadying in standing                            ADL either performed or assessed with clinical judgement   ADL Overall ADL's : Needs assistance/impaired Eating/Feeding: Set up;Sitting   Grooming: Set up;Sitting   Upper Body Bathing: Minimal assistance;Sitting   Lower Body Bathing: Moderate assistance;Sit to/from stand   Upper Body Dressing : Minimal assistance;Sitting   Lower Body Dressing: Maximal assistance;Sit to/from stand   Toilet Transfer: Moderate assistance;+2 for physical assistance (sit<>stand at recliner)           Functional mobility during ADLs: Moderate assistance;+2 for physical assistance;+2 for safety/equipment (sit<>stand) General ADL Comments: Pt presenting with decreased cognition, coorindation, balance, and activity tolerance     Vision Baseline Vision/History: Wears glasses Wears Glasses: At all times Patient Visual Report: No change from baseline       Perception     Praxis      Pertinent Vitals/Pain Pain Assessment: No/denies pain     Hand Dominance Left   Extremity/Trunk Assessment Upper Extremity Assessment Upper Extremity Assessment: Generalized weakness   Lower Extremity Assessment Lower Extremity Assessment: Defer to PT evaluation   Cervical / Trunk Assessment Cervical /  Trunk Assessment: Kyphotic   Communication Communication Communication: No difficulties   Cognition Arousal/Alertness: Awake/alert Behavior During Therapy: WFL for tasks assessed/performed Overall Cognitive Status: Impaired/Different from baseline Area of Impairment: Attention;Memory;Following  commands;Safety/judgement;Awareness;Problem solving;Orientation                 Orientation Level: Disoriented to;Time Current Attention Level: Sustained Memory: Decreased short-term memory Following Commands: Follows one step commands inconsistently;Follows one step commands with increased time Safety/Judgement: Decreased awareness of safety Awareness: Intellectual;Emergent Problem Solving: Slow processing General Comments: Tangiental in conversation. Poor attention and problem solving. Reports hallucinations   General Comments  VSS on RA. BP stable    Exercises Exercises: Other exercises;General Lower Extremity General Exercises - Lower Extremity Long Arc Quad: Both;AROM;10 reps;Seated Other Exercises Other Exercises: Scapular retraction with elevating of chest and head. x3.    Shoulder Instructions      Home Living Family/patient expects to be discharged to:: Private residence Living Arrangements: Spouse/significant other Available Help at Discharge: Family;Available 24 hours/day Type of Home: House Home Access: Stairs to enter [employer] of Steps: 1 Entrance Stairs-Rails: None Home Layout: One level     Bathroom Shower/Tub: Occupational psychologist: Standard Bathroom Accessibility: Yes   Home Equipment: Environmental consultant - 2 wheels;Walker - 4 wheels;Cane - single point;Grab bars - tub/shower          Prior Functioning/Environment Level of Independence: Needs assistance  Gait / Transfers Assistance Needed: Pt uses SPC and rollator at times for household and very short community distances ADL's / Homemaking Assistance Needed: independent with bathing and dressing,             OT Problem List: Decreased strength;Decreased range of motion;Decreased activity tolerance;Impaired balance (sitting and/or standing);Decreased safety awareness;Decreased knowledge of use of DME or AE;Decreased knowledge of precautions;Cardiopulmonary status limiting  activity      OT Treatment/Interventions: Self-care/ADL training;Therapeutic exercise;Energy conservation;DME and/or AE instruction;Therapeutic activities;Patient/family education    OT Goals(Current goals can be found in the care plan section) Acute Rehab OT Goals Patient Stated Goal: Return home with wife OT Goal Formulation: With patient Time For Goal Achievement: 10/12/20 Potential to Achieve Goals: Good  OT Frequency: Min 2X/week   Barriers to D/C:            Co-evaluation PT/OT/SLP Co-Evaluation/Treatment: Yes Reason for Co-Treatment: For patient/therapist safety;To address functional/ADL transfers   OT goals addressed during session: ADL's and self-care      AM-PAC OT "6 Clicks" Daily Activity     Outcome Measure Help from another person eating meals?: A Little Help from another person taking care of personal grooming?: A Little Help from another person toileting, which includes using toliet, bedpan, or urinal?: A Lot Help from another person bathing (including washing, rinsing, drying)?: A Lot Help from another person to put on and taking off regular upper body clothing?: A Lot Help from another person to put on and taking off regular lower body clothing?: A Lot 6 Click Score: 14   End of Session Equipment Utilized During Treatment: Surveyor, mining Communication: Mobility status  Activity Tolerance: Patient tolerated treatment well Patient left: in chair;with call bell/phone within reach;with family/visitor present  OT Visit Diagnosis: Unsteadiness on feet (R26.81);Other abnormalities of gait and mobility (R26.89);Muscle weakness (generalized) (M62.81)                Time: 1530-1601 OT Time Calculation (min): 31 min Charges:  OT General Charges $OT Visit: 1 Visit OT Evaluation $OT Eval Moderate Complexity: 1 Mod  [place], OTR/L Acute Rehab Pager: [phone] Office: [place] 09/28/2020, 5:23 PM

## 2020-09-28 NOTE — Progress Notes (Signed)
Physical Therapy Progress Note  Notes:  Pt sitting up in chair on entry, requesting to go back to bed. RN encourage pt to stay up for awhile longer so focus of session core strengthening and balance in standing. After performing exercises in chair had pt stand for placement of GeoMat to improve weight shifting in chair. Pt requires modAx2 for steadying in RW. Pt unable to maintain standing for >20 sec. Pt obviously fatigued at end of session. Awaiting CABG surgery, hopefully on Thursday. PT recommending CIR level therapy currently. PT will continue to follow acutely.    09/28/20 1727  PT Visit Information  Last PT Received On 09/28/20  Assistance Needed +2 (for lines )  PT/OT/SLP Co-Evaluation/Treatment Yes  Reason for Co-Treatment For patient/therapist safety  PT goals addressed during session Mobility/safety with mobility;Balance  History of Present Illness 86 y.o. male with history of HTN, HLD, recent stroke in 06/2020 and recent hospitalization 07/2020 for PNA presented to OSH with c/o chest pain since 3 am this morning and found to have inferior STEMI and transferred to [facility] for further care. Admitted 11/27 taken to the cath lab 09/01/2020, found to have acute inferoposterior/RV infarction secondary to critical stenoses throughout the proximal and mid RCA with evidence of thrombus. CABG recommended   Subjective Data  Patient Stated Goal Return home with wife  Precautions  Precautions Other (comment)  Precaution Comments swan ganz, A-line  Restrictions  Weight Bearing Restrictions No  Pain Assessment  Pain Assessment No/denies pain  Cognition  Arousal/Alertness Awake/alert  Behavior During Therapy WFL for tasks assessed/performed  Overall Cognitive Status Impaired/Different from baseline  Area of Impairment Attention;Memory;Following commands;Safety/judgement;Awareness;Problem solving;Orientation  Orientation Level Disoriented to;Time  Current Attention Level Sustained  Memory  Decreased short-term memory  Following Commands Follows one step commands inconsistently;Follows one step commands with increased time  Safety/Judgement Decreased awareness of safety  Awareness Intellectual;Emergent  Problem Solving Slow processing  General Comments Tangiental in conversation. Poor attention and problem solving. Reports hallucinations  Bed Mobility  General bed mobility comments Sitting in recliner upon arrival  Transfers  Overall transfer level Needs assistance  Equipment used Rolling walker (2 wheeled)  Transfers Sit to/from Stand  Sit to Stand Mod assist;+2 physical assistance;+2 safety/equipment  General transfer comment Min A +2 to power up and then Mod A +2 for maintaining balance in standing.   Balance  Overall balance assessment Needs assistance  Sitting-balance support Feet supported  Sitting balance-Leahy Scale Fair  Standing balance support Bilateral upper extremity supported  Standing balance-Leahy Scale Poor  Standing balance comment requires B UE support and min A for steadying in standing   General Comments  General comments (skin integrity, edema, etc.) VSS on RA. BP stable  Exercises  Exercises Other exercises;General Lower Extremity  General Exercises - Lower Extremity  Long Arc Quad Both;AROM;10 reps;Seated  Other Exercises  Other Exercises Scapular retraction with elevating of chest and head. x3.   Other Exercises anterior pelvic tilt in sitting x3  PT - End of Session  Activity Tolerance Patient tolerated treatment well  Patient left in bed;with call bell/phone within reach;with nursing/sitter in room  Nurse Communication Mobility status   PT - Assessment/Plan  PT Plan Discharge plan needs to be updated  PT Visit Diagnosis Unsteadiness on feet (R26.81);Other abnormalities of gait and mobility (R26.89);Difficulty in walking, not elsewhere classified (R26.2);Muscle weakness (generalized) (M62.81)  PT Frequency (ACUTE ONLY) Min 3X/week   Recommendations for Other Services Rehab consult  Follow Up Recommendations CIR  PT equipment  None recommended by PT (pt has appropriate DME)  AM-PAC PT "6 Clicks" Mobility Outcome Measure (Version 2)  Help needed turning from your back to your side while in a flat bed without using bedrails? 4  Help needed moving from lying on your back to sitting on the side of a flat bed without using bedrails? 2  Help needed moving to and from a bed to a chair (including a wheelchair)? 2  Help needed standing up from a chair using your arms (e.g., wheelchair or bedside chair)? 2  Help needed to walk in hospital room? 1  Help needed climbing 3-5 steps with a railing?  1  6 Click Score 12  Consider Recommendation of Discharge To: CIR/SNF/LTACH  PT Goal Progression  Progress towards PT goals Progressing toward goals  Acute Rehab PT Goals  PT Goal Formulation With patient  Time For Goal Achievement 10/11/20  Potential to Achieve Goals Fair  PT Time Calculation  PT Start Time (ACUTE ONLY) 1530  PT Stop Time (ACUTE ONLY) 1601  PT Time Calculation (min) (ACUTE ONLY) 31 min  PT General Charges  $$ ACUTE PT VISIT 1 Visit  PT Treatments  $Therapeutic Exercise 8-22 mins    Margarita Bobrowski B. Migdalia Dk PT, DPT Acute Rehabilitation Services Pager [phone] Office [phone]

## 2020-09-28 NOTE — Progress Notes (Signed)
STROKE TEAM PROGRESS NOTE   INTERVAL HISTORY No family at the bedside.  Patient lying in bed, awake alert, orientated, interactive, neuro intact.  CT head showed no acute abnormality.  CT head neck showed unchanged intra and extra cranial vasculature.  Okay to proceed with CABG from neuro standpoint, avoid low BP perioperative.  Vitals:   09/28/20 0930 09/28/20 1030 09/28/20 1100 09/28/20 1130  BP: (!) 84/45 90/65 98/68  (!) 89/62  Pulse:      Resp: (!) 26 (!) 22 (!) 24 (!) 29  Temp: 98.6 F (37 C) 98.8 F (37.1 C) 98.6 F (37 C) 98.8 F (37.1 C)  TempSrc:      SpO2: 94% 95% 96% 99%  Weight:      Height:       CBC:  Recent Labs  Lab 08/31/2020 1658 09/06/2020 1706 09/27/20 0617 09/28/20 0516  WBC 11.8*   < > 16.2* 10.7*  NEUTROABS 8.9*  --   --   --   HGB 9.0*   < > 8.9* 8.2*  HCT 29.6*   < > 29.2* 26.5*  MCV 90.8   < > 91.5 90.8  PLT 166   < > 197 186   < > = values in this interval not displayed.   Basic Metabolic Panel:  Recent Labs  Lab 09/27/20 0617 09/28/20 0516  NA 136 135  K 3.8 3.6  CL 103 99  CO2 24 25  GLUCOSE 139* 125*  BUN 17 16  CREATININE 1.33* 1.22  CALCIUM 8.3* 8.3*   Lipid Panel:  Recent Labs  Lab 09/12/2020 1658  CHOL 105  TRIG 64  HDL 25*  CHOLHDL 4.2  VLDL 13  LDLCALC 67   HgbA1c:  Recent Labs  Lab 09/28/2020 1658  HGBA1C 5.7*   Urine Drug Screen: No results for input(s): LABOPIA, COCAINSCRNUR, LABBENZ, AMPHETMU, THCU, LABBARB in the last 168 hours.  Alcohol Level No results for input(s): ETH in the last 168 hours.  IMAGING past 24 hours CT ANGIO HEAD W OR WO CONTRAST  Result Date: 09/27/2020 CLINICAL DATA:  Stroke or TIA EXAM: CT ANGIOGRAPHY HEAD AND NECK TECHNIQUE: Multidetector CT imaging of the head and neck was performed using the standard protocol during bolus administration of intravenous contrast. Multiplanar CT image reconstructions and MIPs were obtained to evaluate the vascular anatomy. Carotid stenosis measurements (when  applicable) are obtained utilizing NASCET criteria, using the distal internal carotid diameter as the denominator. CONTRAST:  54mL OMNIPAQUE IOHEXOL 350 MG/ML SOLN COMPARISON:  None. FINDINGS: CT HEAD FINDINGS Brain: There is no mass, hemorrhage or extra-axial collection. The size and configuration of the ventricles and extra-axial CSF spaces are normal. There is encephalomalacia at the site of recent left temporal occipital infarct. There is hypoattenuation of the periventricular white matter, most commonly indicating chronic ischemic microangiopathy. Skull: The visualized skull base, calvarium and extracranial soft tissues are normal. Sinuses/Orbits: No fluid levels or advanced mucosal thickening of the visualized paranasal sinuses. No mastoid or middle ear effusion. The orbits are normal. CTA NECK FINDINGS SKELETON: There is no bony spinal canal stenosis. No lytic or blastic lesion. OTHER NECK: Normal pharynx, larynx and major salivary glands. No cervical lymphadenopathy. Unremarkable thyroid gland. UPPER CHEST: Small pleural effusions. AORTIC ARCH: There is calcific atherosclerosis of the aortic arch. There is no aneurysm, dissection or hemodynamically significant stenosis of the visualized portion of the aorta. Normal variant aortic arch branching pattern with the left vertebral artery arising independently from the aortic arch. The visualized proximal  subclavian arteries are widely patent. RIGHT CAROTID SYSTEM: No dissection, occlusion or aneurysm. Mild atherosclerotic calcification at the carotid bifurcation without hemodynamically significant stenosis. LEFT CAROTID SYSTEM: No dissection, occlusion or aneurysm. Mild atherosclerotic calcification at the carotid bifurcation without hemodynamically significant stenosis. VERTEBRAL ARTERIES: Left dominant configuration. Both origins are clearly patent. There is no dissection, occlusion or flow-limiting stenosis to the skull base (V1-V3 segments). CTA HEAD FINDINGS  POSTERIOR CIRCULATION: --Vertebral arteries: Normal V4 segments. --Inferior cerebellar arteries: Normal. --Basilar artery: Normal. --Superior cerebellar arteries: Normal. --Posterior cerebral arteries (PCA): Normal. ANTERIOR CIRCULATION: --Intracranial internal carotid arteries: Atherosclerotic calcification of the internal carotid arteries at the skull base without hemodynamically significant stenosis. --Anterior cerebral arteries (ACA): Normal. Both A1 segments are present. Patent anterior communicating artery (a-comm). --Middle cerebral arteries (MCA): Normal. VENOUS SINUSES: As permitted by contrast timing, patent. ANATOMIC VARIANTS: None Review of the MIP images confirms the above findings. IMPRESSION: 1. No emergent large vessel occlusion or high-grade stenosis of the intracranial arteries. 2. Encephalomalacia at the site of recent left temporal occipital infarct. 3. Small pleural effusions. Aortic Atherosclerosis (ICD10-I70.0). Electronically Signed   By: Ulyses Jarred M.D.   On: 09/27/2020 23:50   CT ANGIO NECK W OR WO CONTRAST  Result Date: 09/27/2020 CLINICAL DATA:  Stroke or TIA EXAM: CT ANGIOGRAPHY HEAD AND NECK TECHNIQUE: Multidetector CT imaging of the head and neck was performed using the standard protocol during bolus administration of intravenous contrast. Multiplanar CT image reconstructions and MIPs were obtained to evaluate the vascular anatomy. Carotid stenosis measurements (when applicable) are obtained utilizing NASCET criteria, using the distal internal carotid diameter as the denominator. CONTRAST:  19mL OMNIPAQUE IOHEXOL 350 MG/ML SOLN COMPARISON:  None. FINDINGS: CT HEAD FINDINGS Brain: There is no mass, hemorrhage or extra-axial collection. The size and configuration of the ventricles and extra-axial CSF spaces are normal. There is encephalomalacia at the site of recent left temporal occipital infarct. There is hypoattenuation of the periventricular white matter, most commonly  indicating chronic ischemic microangiopathy. Skull: The visualized skull base, calvarium and extracranial soft tissues are normal. Sinuses/Orbits: No fluid levels or advanced mucosal thickening of the visualized paranasal sinuses. No mastoid or middle ear effusion. The orbits are normal. CTA NECK FINDINGS SKELETON: There is no bony spinal canal stenosis. No lytic or blastic lesion. OTHER NECK: Normal pharynx, larynx and major salivary glands. No cervical lymphadenopathy. Unremarkable thyroid gland. UPPER CHEST: Small pleural effusions. AORTIC ARCH: There is calcific atherosclerosis of the aortic arch. There is no aneurysm, dissection or hemodynamically significant stenosis of the visualized portion of the aorta. Normal variant aortic arch branching pattern with the left vertebral artery arising independently from the aortic arch. The visualized proximal subclavian arteries are widely patent. RIGHT CAROTID SYSTEM: No dissection, occlusion or aneurysm. Mild atherosclerotic calcification at the carotid bifurcation without hemodynamically significant stenosis. LEFT CAROTID SYSTEM: No dissection, occlusion or aneurysm. Mild atherosclerotic calcification at the carotid bifurcation without hemodynamically significant stenosis. VERTEBRAL ARTERIES: Left dominant configuration. Both origins are clearly patent. There is no dissection, occlusion or flow-limiting stenosis to the skull base (V1-V3 segments). CTA HEAD FINDINGS POSTERIOR CIRCULATION: --Vertebral arteries: Normal V4 segments. --Inferior cerebellar arteries: Normal. --Basilar artery: Normal. --Superior cerebellar arteries: Normal. --Posterior cerebral arteries (PCA): Normal. ANTERIOR CIRCULATION: --Intracranial internal carotid arteries: Atherosclerotic calcification of the internal carotid arteries at the skull base without hemodynamically significant stenosis. --Anterior cerebral arteries (ACA): Normal. Both A1 segments are present. Patent anterior communicating  artery (a-comm). --Middle cerebral arteries (MCA): Normal. VENOUS SINUSES: As permitted by  contrast timing, patent. ANATOMIC VARIANTS: None Review of the MIP images confirms the above findings. IMPRESSION: 1. No emergent large vessel occlusion or high-grade stenosis of the intracranial arteries. 2. Encephalomalacia at the site of recent left temporal occipital infarct. 3. Small pleural effusions. Aortic Atherosclerosis (ICD10-I70.0). Electronically Signed   By: Ulyses Jarred M.D.   On: 09/27/2020 23:50   VAS US DOPPLER PRE CABG  Result Date: 09/27/2020 PREOPERATIVE VASCULAR EVALUATION  Indications:            Pre-CABG. Risk Factors:           Hypertension, hyperlipidemia, prior CVA. Vascular Interventions: Bilateral CEA 2016. Limitations:            poor patient cooperation, hypersomnolence, movement,                         line in right neck, recent right radial artery access. Comparison Study:       07/30/2020 carotid artery duplex- >70% right ICA                         stenosis. Performing Technologist: Maudry Mayhew MHA, RDMS, RVT, RDCS  Examination Guidelines: A complete evaluation includes B-mode imaging, spectral Doppler, color Doppler, and power Doppler as needed of all accessible portions of each vessel. Bilateral testing is considered an integral part of a complete examination. Limited examinations for reoccurring indications may be performed as noted.  Right Carotid Findings: +----------+--------+--------+--------+-------------------------+--------------+           PSV cm/sEDV cm/sStenosisDescribe                 Comments       +----------+--------+--------+--------+-------------------------+--------------+ CCA Prox                                                   Not visualized +----------+--------+--------+--------+-------------------------+--------------+ CCA Distal                                                 Not visualized  +----------+--------+--------+--------+-------------------------+--------------+ ICA Prox  167     64      40-59%  heterogenous, irregular                                                   and calcific                            +----------+--------+--------+--------+-------------------------+--------------+ ICA Mid   53      24                                                      +----------+--------+--------+--------+-------------------------+--------------+ ICA Distal46      15                                                      +----------+--------+--------+--------+-------------------------+--------------+  ECA                                                        Not visualized +----------+--------+--------+--------+-------------------------+--------------+ Portions of this table do not appear on this page. +----------+--------+-------+------------+------------+           PSV cm/sEDV cmsDescribe    Arm Pressure +----------+--------+-------+------------+------------+ Subclavian               Not assessed             +----------+--------+-------+------------+------------+ +---------+--------+--------+------------+ VertebralPSV cm/sEDV cm/sNot assessed +---------+--------+--------+------------+ Left Carotid Findings: +----------+--------+--------+--------+--------------------------+--------+           PSV cm/sEDV cm/sStenosisDescribe                  Comments +----------+--------+--------+--------+--------------------------+--------+ CCA Prox  65      15                                                 +----------+--------+--------+--------+--------------------------+--------+ CCA Distal66      15              smooth and heterogenous            +----------+--------+--------+--------+--------------------------+--------+ ICA Prox  61      20              smooth and heterogenous             +----------+--------+--------+--------+--------------------------+--------+ ICA Distal99      45                                                 +----------+--------+--------+--------+--------------------------+--------+ ECA       77      13              heterogenous and irregular         +----------+--------+--------+--------+--------------------------+--------+ +----------+--------+--------+----------------+------------+ SubclavianPSV cm/sEDV cm/sDescribe        Arm Pressure +----------+--------+--------+----------------+------------+           120             Multiphasic, WNL             +----------+--------+--------+----------------+------------+ +---------+--------+---+--------+-+---------+ VertebralPSV cm/s124EDV cm/s5Antegrade +---------+--------+---+--------+-+---------+  ABI Findings: +--------+------------------+-----+---------+----------------------------------+ Right   Rt Pressure (mmHg)IndexWaveform Comment                            +--------+------------------+-----+---------+----------------------------------+ Brachial                       triphasicPressure not obtained due to                                               recent radial artery access        +--------+------------------+-----+---------+----------------------------------+ PTA  triphasic                                   +--------+------------------+-----+---------+----------------------------------+ DP                             triphasic                                   +--------+------------------+-----+---------+----------------------------------+ +--------+------------------+-----+---------+-------+ Left    Lt Pressure (mmHg)IndexWaveform Comment +--------+------------------+-----+---------+-------+ Brachial79                     triphasic        +--------+------------------+-----+---------+-------+ PTA                             triphasic        +--------+------------------+-----+---------+-------+ DP                             triphasic        +--------+------------------+-----+---------+-------+  Right Doppler Findings: +-----------+--------+-----+---------+-----------------------------------------+ Site       PressureIndexDoppler  Comments                                  +-----------+--------+-----+---------+-----------------------------------------+ Brachial                triphasicPressure not obtained due to recent                                        radial artery access                      +-----------+--------+-----+---------+-----------------------------------------+ Radial                  biphasic                                           +-----------+--------+-----+---------+-----------------------------------------+ Ulnar                   biphasic                                           +-----------+--------+-----+---------+-----------------------------------------+ Palmar Arch                      Signal is unaffected with radial                                           compression, decreases >50% with ulnar                                     compression.                              +-----------+--------+-----+---------+-----------------------------------------+  Left Doppler Findings: +-----------+--------+-----+---------+--------------------+ Site       PressureIndexDoppler  Comments             +-----------+--------+-----+---------+--------------------+ Brachial   79           triphasic                     +-----------+--------+-----+---------+--------------------+ Radial                  biphasic                      +-----------+--------+-----+---------+--------------------+ Ulnar                   triphasic                     +-----------+--------+-----+---------+--------------------+ Palmar Arch                       Within normal limits +-----------+--------+-----+---------+--------------------+  Summary: Right Carotid: Velocities in the right ICA are consistent with an upper-range                40-59% stenosis. Left Carotid: Velocities in the left ICA are consistent with a 1-39% stenosis. Vertebrals:  Left vertebral artery demonstrates antegrade flow. Right vertebral              artery was not visualized. Subclavians: Right subclavian artery was not visualized. Normal flow              hemodynamics were seen in the left subclavian artery. Right Upper Extremity: Doppler waveforms remain within normal limits with right radial compression. Doppler waveforms decrease >50% with right ulnar compression.  Left Upper Extremity: Doppler waveforms remain within normal limits with left radial compression. Doppler waveforms remain within normal limits with left ulnar compression.  Pedal waveforms: Bilateral pedal artery waveforms are within normal limits at rest. Electronically signed by Ruta Hinds MD on 09/27/2020 at 3:54:03 PM.    Final    VAS [place] LOWER EXTREMITY VENOUS (DVT)  Result Date: 09/27/2020  Lower Venous DVT Study Indications: Edema.  Limitations: Limited patient cooperation, position. Comparison Study: No prior study Performing Technologist: Maudry Mayhew MHA, RDMS, RVT, RDCS  Examination Guidelines: A complete evaluation includes B-mode imaging, spectral Doppler, color Doppler, and power Doppler as needed of all accessible portions of each vessel. Bilateral testing is considered an integral part of a complete examination. Limited examinations for reoccurring indications may be performed as noted. The reflux portion of the exam is performed with the patient in reverse Trendelenburg.  Right Technical Findings: Not visualized segments include CFV.  +---------+---------------+---------+-----------+----------+--------------+ LEFT     CompressibilityPhasicitySpontaneityPropertiesThrombus Aging  +---------+---------------+---------+-----------+----------+--------------+ CFV      Full           Yes      Yes                                 +---------+---------------+---------+-----------+----------+--------------+ SFJ      Full                                                        +---------+---------------+---------+-----------+----------+--------------+ FV Prox  Full                                                        +---------+---------------+---------+-----------+----------+--------------+  FV Mid   Full                                                        +---------+---------------+---------+-----------+----------+--------------+ FV DistalFull                                                        +---------+---------------+---------+-----------+----------+--------------+ PFV      Full                                                        +---------+---------------+---------+-----------+----------+--------------+ POP      Full           Yes      Yes                                 +---------+---------------+---------+-----------+----------+--------------+ PTV      Full                                                        +---------+---------------+---------+-----------+----------+--------------+ PERO     Full                                                        +---------+---------------+---------+-----------+----------+--------------+     Summary: LEFT: - There is no evidence of deep vein thrombosis in the lower extremity.  - No cystic structure found in the popliteal fossa.  *See table(s) above for measurements and observations. Electronically signed by Ruta Hinds MD on 09/27/2020 at 3:53:48 PM.    Final     PHYSICAL EXAM  BP (!) 89/62   Pulse 95   Temp 98.8 F (37.1 C)   Resp (!) 29   Ht 5\' 9"  (1.753 m)   Wt 74.1 kg   SpO2 99%   BMI 24.12 kg/m   General - Well nourished, well developed, in no apparent  distress.  Ophthalmologic - fundi not visualized due to noncooperation.  Cardiovascular - Regular rhythm and rate.  Mental Status -  Level of arousal and orientation to time, place, and person were intact. Language including expression, naming, repetition, comprehension was assessed and found intact. Fund of Knowledge was assessed and was intact.  Cranial Nerves II - XII - II - Visual field intact OU. 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 V - Facial sensation intact bilaterally. VII - Facial movement intact bilaterally. VIII - Hearing & vestibular intact bilaterally. X - Palate elevates symmetrically. XI - Chin turning & shoulder shrug  intact bilaterally. XII - Tongue protrusion intact.  Motor Strength - The patient's strength showed chronic LUE 4/5 proximal and distal, RUE 5/5, BLE 4/5.  Bulk was mildly decreased on the LUE and fasciculations were absent.   Motor Tone - Muscle tone was assessed at the neck and appendages and was normal.  Reflexes - The patient's reflexes were symmetrical in all extremities and he had no pathological reflexes.  Sensory - Light touch, temperature/pinprick were assessed and were decreased on the RLE and LUE, chronic.    Coordination - The patient had normal movements in the hands with no ataxia or dysmetria.  Tremor was absent.  Gait and Station - deferred.   ASSESSMENT/PLAN Mr. Kevin Matthews is a 86 y.o. male with history of HLD, Htn, AAA, bilateral CEA 2016, recent pneumonia, lumbar radiculopathy spinal stimulator - no MRI), and a strong family history of CAD, recent stroke in 06/2020 was admitted for STEMI. Cardiac cath down with multi-vessel CAD. CABG recommended. Neurology consult for preop risk stratification.   Hx of recent stroke (2 months ago) - posterior MCA infarct - embolic - source  unclear  CT head - 07/29/20 - Acute or early subacute infarct in the posterior left temporal and lateral occipital lobe  MRI head - not able to perform (spinal stimulator)  CTA H&N - 07/29/20 - No emergent large vessel occlusion or high-grade stenosis of the intracranial or cervical arteries although b/l ICA bulb and siphon atherosclerosis.  Carotid Doppler - 07/30/2020 - Right ICA stenosis estimated at greater than 70%, and left ICA < 50% stenosis  2D echo 07/30/2020 EF 55-60%  Repeat 2D 11/29 EF 30-35%. LA mild to moderate dilated.  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 No acute change  CTA head and neck - no ELVO. Right ICA bulb athero but no significant stenosis, b/l ICA siphon athero R>L.   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  CABG planned Thurs  Acute Systolic CHF / Cardiogenic Shock Ischemic Cardiomyopathy  2D Echo - EF 30 - 35%, down from 55 to 60% in 07/2020.  On Milrinone IV  On digoxin  Hypotension Hx of hypertension  Home BP meds: hydralazine  Current BP meds: Levophed  BP running low   Avoid low BP given bilateral carotid atherosclerosis  Fever and  leukocytosis  WBC's - 11.8->20.5->20.7->16.2->10.7  Tmax 101.7->100.4 ->99.9  Bx cultures no growth < 24h.   On ceftriaxone + doxycyline  Hyperlipidemia  Home Lipid lowering medication: Pravachol 40 mg daily  LDL 67, goal < 70  Current lipid lowering medication: Lipitor 80 mg daily   Continue statin at discharge  Other  Stroke Risk Factors  Advanced age  Former cigarette smoker - quit 41 years ago  ETOH use, advised to drink no more than 1 alcoholic beverage per day.  Other Active Problems  Chronic cervical radiculopathy and lumbar sacral radiculopathy - chronic left upper extremity and right lower extremity weakness and numbness  Hospital day # 3  Neurology will sign off. Please call with questions. Pt will follow up with Dr. Merlene Laughter at [facility] in about 4 weeks. Thanks for the consult.   Rosalin Hawking, MD PhD Stroke Neurology 09/28/2020 3:15 PM  To contact Stroke Continuity provider, please refer to [email or web address] After hours, contact General Neurology

## 2020-09-28 NOTE — Progress Notes (Signed)
T[facility] Progress Note  HD #3 with STEMI Stable on NE/milrinone Labs stable No significant events  PE: BP (!) 89/62   Pulse 95   Temp 98.8 F (37.1 C)   Resp (!) 29   Ht 5\' 9"  (1.753 m)   Wt 74.1 kg   SpO2 99%   BMI 24.12 kg/m   Alert/oriented CTA RRR No significant peripheral edema   Intake/Output Summary (Last 24 hours) at 09/28/2020 1425 Last data filed at 09/28/2020 1100 Gross per 24 hour  Intake 1342.39 ml  Output 1095 ml  Net 247.39 ml    Imaging: reviewed   A/p: Discussed with patient and wife his underlying CAD and recommendation for CABG. They appear to understand, have asked questions, and wish to proceed.  Will tentatively plan surgery Thurs.  Lexani Corona Z. Orvan Seen, [place]

## 2020-09-28 NOTE — Progress Notes (Signed)
ANTICOAGULATION CONSULT NOTE - Initial Consult  Pharmacy Consult for tirofiban Indication: chest pain/ACS  Allergies  Allergen Reactions  . Oxycodone Other (See Comments)    States that when he took oxycodone he couldn't move out of the bed. Other reaction(s): Hallucinations    Patient Measurements: Height: 5\' 9"  (175.3 cm) Weight: 74.1 kg (163 lb 5.8 oz) IBW/kg (Calculated) : 70.7 Heparin Dosing Weight: 74kg  Vital Signs: Temp: 98.8 F (37.1 C) (11/30 0700) BP: 78/48 (11/30 0700)  Labs: Recent Labs    09/15/2020 1658 09/08/2020 1706 09/17/2020 2057 09/10/2020 2057 09/26/20 0520 09/26/20 0520 09/27/20 0617 09/28/20 0516  HGB 9.0*   < > 9.9*   < > 9.4*   < > 8.9* 8.2*  HCT 29.6*   < > 32.4*   < > 30.9*  --  29.2* 26.5*  PLT 166   < > 198   < > 206  --  197 186  APTT 29  --   --   --   --   --   --   --   LABPROT 14.4  --   --   --   --   --   --   --   INR 1.2  --   --   --   --   --   --   --   CREATININE 1.32*   < >  --    < > 1.21  --  1.33* 1.22  TROPONINIHS 7,362*  --  11,750*  --   --   --   --   --    < > = values in this interval not displayed.    Estimated Creatinine Clearance: 45.1 mL/min (by C-G formula based on SCr of 1.22 mg/dL).   Medical History: Past Medical History:  Diagnosis Date  . Arthritis   . BPH (benign prostatic hyperplasia)   . GERD (gastroesophageal reflux disease)   . Heart murmur    dr Feliz Beam  . Hypercholesteremia     Assessment: 59 yoM admitted as code STEMI with mvCAD and thrombotic lesion. PTA clopidogrel held, tirofiban started. CABG planned. CrCl ~45 ml/min.  Goal of Therapy:  Monitor platelets by anticoagulation protocol: Yes   Plan:  Tirofiban 0.075 mcg/kg/min - will need to stop 6h pre-op Watch CBC   Arrie Senate, PharmD, BCPS, [facility] Clinical Pharmacist [phone] Please check AMION for all [facility] numbers 09/28/2020

## 2020-09-28 NOTE — Plan of Care (Signed)

## 2020-09-28 NOTE — Progress Notes (Addendum)
Patient ID: Kevin Matthews, male   DOB: 1934/09/13, 86 y.o.   MRN: [id number]     Advanced Heart Failure Rounding Note  PCP-Cardiologist: Sanda Klein, MD   Subjective:    Afebrile this morning, WBCs 10.7. He is on RA.   No further chest pain.  Remains on Aggrastat.   I/Os negative and stable creatinine with po Lasix.  Remains on milrinone 0.375 and NE 6. SBP 90s.   Hgb trending down but no overt bleeding.   No dyspnea, got out of bed yesterday.    [place] numbers: CVP 6 PA 45/25 CI 2.3 Co-ox 51%  - Carotid dopplers: 24-58% RICA, 0-99% LICA - CTA head/neck: No significant carotid stenosis noted.    Objective:   Weight Range: 74.1 kg Body mass index is 24.12 kg/m.   Vital Signs:   Temp:  [98.1 F (36.7 C)-99.9 F (37.7 C)] 98.8 F (37.1 C) (11/30 0700) Pulse Rate:  [90] 90 (11/29 2000) Resp:  [0-30] 26 (11/30 0700) BP: (60-140)/(47-115) 78/48 (11/30 0700) SpO2:  [90 %-100 %] 94 % (11/30 0700) Weight:  [74.1 kg] 74.1 kg (11/30 0530) Last BM Date: 09/26/20  Weight change: Filed Weights   09/24/2020 1652 09/28/20 0530  Weight: 77.1 kg 74.1 kg    Intake/Output:   Intake/Output Summary (Last 24 hours) at 09/28/2020 0827 Last data filed at 09/28/2020 0811 Gross per 24 hour  Intake 1134.07 ml  Output 2130 ml  Net -995.93 ml      Physical Exam    General: NAD Neck: No JVD, no thyromegaly or thyroid nodule.  Lungs: Clear to auscultation bilaterally with normal respiratory effort. CV: Nondisplaced PMI.  Heart regular S1/S2, no S3/S4, no murmur.  No peripheral edema.   Abdomen: Soft, nontender, no hepatosplenomegaly, no distention.  Skin: Intact without lesions or rashes.  Neurologic: Alert and oriented x 3.  Psych: Normal affect. Extremities: No clubbing or cyanosis.  HEENT: Normal.     Telemetry   NSR 80s-90s (personally reviewed)   Labs    CBC Recent Labs    09/14/2020 1658 09/07/2020 1706 09/27/20 0617 09/28/20 0516  WBC 11.8*   < > 16.2*  10.7*  NEUTROABS 8.9*  --   --   --   HGB 9.0*   < > 8.9* 8.2*  HCT 29.6*   < > 29.2* 26.5*  MCV 90.8   < > 91.5 90.8  PLT 166   < > 197 186   < > = values in this interval not displayed.   Basic Metabolic Panel Recent Labs    09/27/20 0617 09/28/20 0516  NA 136 135  K 3.8 3.6  CL 103 99  CO2 24 25  GLUCOSE 139* 125*  BUN 17 16  CREATININE 1.33* 1.22  CALCIUM 8.3* 8.3*   Liver Function Tests Recent Labs    08/31/2020 1658 09/28/20 0516  AST 50* 47*  ALT 19 18  ALKPHOS 66 61  BILITOT 0.8 0.9  PROT 6.0* 5.7*  ALBUMIN 2.8* 2.3*   No results for input(s): LIPASE, AMYLASE in the last 72 hours. Cardiac Enzymes No results for input(s): CKTOTAL, CKMB, CKMBINDEX, TROPONINI in the last 72 hours.  BNP: BNP (last 3 results) No results for input(s): BNP in the last 8760 hours.  ProBNP (last 3 results) No results for input(s): PROBNP in the last 8760 hours.   D-Dimer No results for input(s): DDIMER in the last 72 hours. Hemoglobin A1C Recent Labs    09/08/2020 1658  HGBA1C 5.7*  Fasting Lipid Panel Recent Labs    09/05/2020 1658  CHOL 105  HDL 25*  LDLCALC 67  TRIG 64  CHOLHDL 4.2   Thyroid Function Tests No results for input(s): TSH, T4TOTAL, T3FREE, THYROIDAB in the last 72 hours.  Invalid input(s): FREET3  Other results:   Imaging    CT ANGIO HEAD W OR WO CONTRAST  Result Date: 09/27/2020 CLINICAL DATA:  Stroke or TIA EXAM: CT ANGIOGRAPHY HEAD AND NECK TECHNIQUE: Multidetector CT imaging of the head and neck was performed using the standard protocol during bolus administration of intravenous contrast. Multiplanar CT image reconstructions and MIPs were obtained to evaluate the vascular anatomy. Carotid stenosis measurements (when applicable) are obtained utilizing NASCET criteria, using the distal internal carotid diameter as the denominator. CONTRAST:  33mL OMNIPAQUE IOHEXOL 350 MG/ML SOLN COMPARISON:  None. FINDINGS: CT HEAD FINDINGS Brain: There is no  mass, hemorrhage or extra-axial collection. The size and configuration of the ventricles and extra-axial CSF spaces are normal. There is encephalomalacia at the site of recent left temporal occipital infarct. There is hypoattenuation of the periventricular white matter, most commonly indicating chronic ischemic microangiopathy. Skull: The visualized skull base, calvarium and extracranial soft tissues are normal. Sinuses/Orbits: No fluid levels or advanced mucosal thickening of the visualized paranasal sinuses. No mastoid or middle ear effusion. The orbits are normal. CTA NECK FINDINGS SKELETON: There is no bony spinal canal stenosis. No lytic or blastic lesion. OTHER NECK: Normal pharynx, larynx and major salivary glands. No cervical lymphadenopathy. Unremarkable thyroid gland. UPPER CHEST: Small pleural effusions. AORTIC ARCH: There is calcific atherosclerosis of the aortic arch. There is no aneurysm, dissection or hemodynamically significant stenosis of the visualized portion of the aorta. Normal variant aortic arch branching pattern with the left vertebral artery arising independently from the aortic arch. The visualized proximal subclavian arteries are widely patent. RIGHT CAROTID SYSTEM: No dissection, occlusion or aneurysm. Mild atherosclerotic calcification at the carotid bifurcation without hemodynamically significant stenosis. LEFT CAROTID SYSTEM: No dissection, occlusion or aneurysm. Mild atherosclerotic calcification at the carotid bifurcation without hemodynamically significant stenosis. VERTEBRAL ARTERIES: Left dominant configuration. Both origins are clearly patent. There is no dissection, occlusion or flow-limiting stenosis to the skull base (V1-V3 segments). CTA HEAD FINDINGS POSTERIOR CIRCULATION: --Vertebral arteries: Normal V4 segments. --Inferior cerebellar arteries: Normal. --Basilar artery: Normal. --Superior cerebellar arteries: Normal. --Posterior cerebral arteries (PCA): Normal. ANTERIOR  CIRCULATION: --Intracranial internal carotid arteries: Atherosclerotic calcification of the internal carotid arteries at the skull base without hemodynamically significant stenosis. --Anterior cerebral arteries (ACA): Normal. Both A1 segments are present. Patent anterior communicating artery (a-comm). --Middle cerebral arteries (MCA): Normal. VENOUS SINUSES: As permitted by contrast timing, patent. ANATOMIC VARIANTS: None Review of the MIP images confirms the above findings. IMPRESSION: 1. No emergent large vessel occlusion or high-grade stenosis of the intracranial arteries. 2. Encephalomalacia at the site of recent left temporal occipital infarct. 3. Small pleural effusions. Aortic Atherosclerosis (ICD10-I70.0). Electronically Signed   By: Ulyses Jarred M.D.   On: 09/27/2020 23:50   CT ANGIO NECK W OR WO CONTRAST  Result Date: 09/27/2020 CLINICAL DATA:  Stroke or TIA EXAM: CT ANGIOGRAPHY HEAD AND NECK TECHNIQUE: Multidetector CT imaging of the head and neck was performed using the standard protocol during bolus administration of intravenous contrast. Multiplanar CT image reconstructions and MIPs were obtained to evaluate the vascular anatomy. Carotid stenosis measurements (when applicable) are obtained utilizing NASCET criteria, using the distal internal carotid diameter as the denominator. CONTRAST:  67mL OMNIPAQUE IOHEXOL 350 MG/ML SOLN  COMPARISON:  None. FINDINGS: CT HEAD FINDINGS Brain: There is no mass, hemorrhage or extra-axial collection. The size and configuration of the ventricles and extra-axial CSF spaces are normal. There is encephalomalacia at the site of recent left temporal occipital infarct. There is hypoattenuation of the periventricular white matter, most commonly indicating chronic ischemic microangiopathy. Skull: The visualized skull base, calvarium and extracranial soft tissues are normal. Sinuses/Orbits: No fluid levels or advanced mucosal thickening of the visualized paranasal sinuses.  No mastoid or middle ear effusion. The orbits are normal. CTA NECK FINDINGS SKELETON: There is no bony spinal canal stenosis. No lytic or blastic lesion. OTHER NECK: Normal pharynx, larynx and major salivary glands. No cervical lymphadenopathy. Unremarkable thyroid gland. UPPER CHEST: Small pleural effusions. AORTIC ARCH: There is calcific atherosclerosis of the aortic arch. There is no aneurysm, dissection or hemodynamically significant stenosis of the visualized portion of the aorta. Normal variant aortic arch branching pattern with the left vertebral artery arising independently from the aortic arch. The visualized proximal subclavian arteries are widely patent. RIGHT CAROTID SYSTEM: No dissection, occlusion or aneurysm. Mild atherosclerotic calcification at the carotid bifurcation without hemodynamically significant stenosis. LEFT CAROTID SYSTEM: No dissection, occlusion or aneurysm. Mild atherosclerotic calcification at the carotid bifurcation without hemodynamically significant stenosis. VERTEBRAL ARTERIES: Left dominant configuration. Both origins are clearly patent. There is no dissection, occlusion or flow-limiting stenosis to the skull base (V1-V3 segments). CTA HEAD FINDINGS POSTERIOR CIRCULATION: --Vertebral arteries: Normal V4 segments. --Inferior cerebellar arteries: Normal. --Basilar artery: Normal. --Superior cerebellar arteries: Normal. --Posterior cerebral arteries (PCA): Normal. ANTERIOR CIRCULATION: --Intracranial internal carotid arteries: Atherosclerotic calcification of the internal carotid arteries at the skull base without hemodynamically significant stenosis. --Anterior cerebral arteries (ACA): Normal. Both A1 segments are present. Patent anterior communicating artery (a-comm). --Middle cerebral arteries (MCA): Normal. VENOUS SINUSES: As permitted by contrast timing, patent. ANATOMIC VARIANTS: None Review of the MIP images confirms the above findings. IMPRESSION: 1. No emergent large vessel  occlusion or high-grade stenosis of the intracranial arteries. 2. Encephalomalacia at the site of recent left temporal occipital infarct. 3. Small pleural effusions. Aortic Atherosclerosis (ICD10-I70.0). Electronically Signed   By: Ulyses Jarred M.D.   On: 09/27/2020 23:50   VAS US DOPPLER PRE CABG  Result Date: 09/27/2020 PREOPERATIVE VASCULAR EVALUATION  Indications:            Pre-CABG. Risk Factors:           Hypertension, hyperlipidemia, prior CVA. Vascular Interventions: Bilateral CEA 2016. Limitations:            poor patient cooperation, hypersomnolence, movement,                         line in right neck, recent right radial artery access. Comparison Study:       07/30/2020 carotid artery duplex- >70% right ICA                         stenosis. Performing Technologist: Maudry Mayhew MHA, RDMS, RVT, RDCS  Examination Guidelines: A complete evaluation includes B-mode imaging, spectral Doppler, color Doppler, and power Doppler as needed of all accessible portions of each vessel. Bilateral testing is considered an integral part of a complete examination. Limited examinations for reoccurring indications may be performed as noted.  Right Carotid Findings: +----------+--------+--------+--------+-------------------------+--------------+           PSV cm/sEDV cm/sStenosisDescribe  Comments       +----------+--------+--------+--------+-------------------------+--------------+ CCA Prox                                                   Not visualized +----------+--------+--------+--------+-------------------------+--------------+ CCA Distal                                                 Not visualized +----------+--------+--------+--------+-------------------------+--------------+ ICA Prox  167     64      40-59%  heterogenous, irregular                                                   and calcific                             +----------+--------+--------+--------+-------------------------+--------------+ ICA Mid   53      24                                                      +----------+--------+--------+--------+-------------------------+--------------+ ICA Distal46      15                                                      +----------+--------+--------+--------+-------------------------+--------------+ ECA                                                        Not visualized +----------+--------+--------+--------+-------------------------+--------------+ Portions of this table do not appear on this page. +----------+--------+-------+------------+------------+           PSV cm/sEDV cmsDescribe    Arm Pressure +----------+--------+-------+------------+------------+ Subclavian               Not assessed             +----------+--------+-------+------------+------------+ +---------+--------+--------+------------+ VertebralPSV cm/sEDV cm/sNot assessed +---------+--------+--------+------------+ Left Carotid Findings: +----------+--------+--------+--------+--------------------------+--------+           PSV cm/sEDV cm/sStenosisDescribe                  Comments +----------+--------+--------+--------+--------------------------+--------+ CCA Prox  65      15                                                 +----------+--------+--------+--------+--------------------------+--------+ CCA Distal66      15              smooth and heterogenous            +----------+--------+--------+--------+--------------------------+--------+ ICA Prox  61      20              smooth and heterogenous            +----------+--------+--------+--------+--------------------------+--------+ ICA Distal99      45                                                 +----------+--------+--------+--------+--------------------------+--------+ ECA       77      13              heterogenous and  irregular         +----------+--------+--------+--------+--------------------------+--------+ +----------+--------+--------+----------------+------------+ SubclavianPSV cm/sEDV cm/sDescribe        Arm Pressure +----------+--------+--------+----------------+------------+           120             Multiphasic, WNL             +----------+--------+--------+----------------+------------+ +---------+--------+---+--------+-+---------+ VertebralPSV cm/s124EDV cm/s5Antegrade +---------+--------+---+--------+-+---------+  ABI Findings: +--------+------------------+-----+---------+----------------------------------+ Right   Rt Pressure (mmHg)IndexWaveform Comment                            +--------+------------------+-----+---------+----------------------------------+ Brachial                       triphasicPressure not obtained due to                                               recent radial artery access        +--------+------------------+-----+---------+----------------------------------+ PTA                            triphasic                                   +--------+------------------+-----+---------+----------------------------------+ DP                             triphasic                                   +--------+------------------+-----+---------+----------------------------------+ +--------+------------------+-----+---------+-------+ Left    Lt Pressure (mmHg)IndexWaveform Comment +--------+------------------+-----+---------+-------+ Brachial79                     triphasic        +--------+------------------+-----+---------+-------+ PTA                            triphasic        +--------+------------------+-----+---------+-------+ DP                             triphasic        +--------+------------------+-----+---------+-------+  Right Doppler Findings:  +-----------+--------+-----+---------+-----------------------------------------+ Site       PressureIndexDoppler  Comments                                  +-----------+--------+-----+---------+-----------------------------------------+  Brachial                triphasicPressure not obtained due to recent                                        radial artery access                      +-----------+--------+-----+---------+-----------------------------------------+ Radial                  biphasic                                           +-----------+--------+-----+---------+-----------------------------------------+ Ulnar                   biphasic                                           +-----------+--------+-----+---------+-----------------------------------------+ Palmar Arch                      Signal is unaffected with radial                                           compression, decreases >50% with ulnar                                     compression.                              +-----------+--------+-----+---------+-----------------------------------------+  Left Doppler Findings: +-----------+--------+-----+---------+--------------------+ Site       PressureIndexDoppler  Comments             +-----------+--------+-----+---------+--------------------+ Brachial   79           triphasic                     +-----------+--------+-----+---------+--------------------+ Radial                  biphasic                      +-----------+--------+-----+---------+--------------------+ Ulnar                   triphasic                     +-----------+--------+-----+---------+--------------------+ Palmar Arch                      Within normal limits +-----------+--------+-----+---------+--------------------+  Summary: Right Carotid: Velocities in the right ICA are consistent with an upper-range                40-59% stenosis. Left  Carotid: Velocities in the left ICA are consistent with a 1-39% stenosis. Vertebrals:  Left vertebral artery demonstrates antegrade flow. Right vertebral              artery was not visualized. Subclavians: Right  subclavian artery was not visualized. Normal flow              hemodynamics were seen in the left subclavian artery. Right Upper Extremity: Doppler waveforms remain within normal limits with right radial compression. Doppler waveforms decrease >50% with right ulnar compression.  Left Upper Extremity: Doppler waveforms remain within normal limits with left radial compression. Doppler waveforms remain within normal limits with left ulnar compression.  Pedal waveforms: Bilateral pedal artery waveforms are within normal limits at rest. Electronically signed by Ruta Hinds MD on 09/27/2020 at 3:54:03 PM.    Final    VAS [place] LOWER EXTREMITY VENOUS (DVT)  Result Date: 09/27/2020  Lower Venous DVT Study Indications: Edema.  Limitations: Limited patient cooperation, position. Comparison Study: No prior study Performing Technologist: Maudry Mayhew MHA, RDMS, RVT, RDCS  Examination Guidelines: A complete evaluation includes B-mode imaging, spectral Doppler, color Doppler, and power Doppler as needed of all accessible portions of each vessel. Bilateral testing is considered an integral part of a complete examination. Limited examinations for reoccurring indications may be performed as noted. The reflux portion of the exam is performed with the patient in reverse Trendelenburg.  Right Technical Findings: Not visualized segments include CFV.  +---------+---------------+---------+-----------+----------+--------------+ LEFT     CompressibilityPhasicitySpontaneityPropertiesThrombus Aging +---------+---------------+---------+-----------+----------+--------------+ CFV      Full           Yes      Yes                                  +---------+---------------+---------+-----------+----------+--------------+ SFJ      Full                                                        +---------+---------------+---------+-----------+----------+--------------+ FV Prox  Full                                                        +---------+---------------+---------+-----------+----------+--------------+ FV Mid   Full                                                        +---------+---------------+---------+-----------+----------+--------------+ FV DistalFull                                                        +---------+---------------+---------+-----------+----------+--------------+ PFV      Full                                                        +---------+---------------+---------+-----------+----------+--------------+ POP      Full           Yes  Yes                                 +---------+---------------+---------+-----------+----------+--------------+ PTV      Full                                                        +---------+---------------+---------+-----------+----------+--------------+ PERO     Full                                                        +---------+---------------+---------+-----------+----------+--------------+     Summary: LEFT: - There is no evidence of deep vein thrombosis in the lower extremity.  - No cystic structure found in the popliteal fossa.  *See table(s) above for measurements and observations. Electronically signed by Ruta Hinds MD on 09/27/2020 at 3:53:48 PM.    Final      Medications:     Scheduled Medications: . acetaminophen  500 mg Oral Q6H  . vitamin C  500 mg Oral Daily  . aspirin EC  81 mg Oral Daily  . atorvastatin  80 mg Oral Daily  . Chlorhexidine Gluconate Cloth  6 each Topical Daily  . cholecalciferol  2,000 Units Oral Daily  . digoxin  0.125 mg Oral Daily  . doxycycline  100 mg Oral Q12H  . feeding supplement   237 mL Oral BID BM  . furosemide  40 mg Oral Daily  . gabapentin  400 mg Oral TID  . heparin injection (subcutaneous)  5,000 Units Subcutaneous Q8H  . multivitamin with minerals  1 tablet Oral Daily  . pantoprazole  40 mg Oral Daily  . rOPINIRole  1 mg Oral QHS  . sodium chloride flush  10-40 mL Intracatheter Q12H  . sodium chloride flush  3 mL Intravenous Q12H    Infusions: . sodium chloride 10 mL/hr at 09/26/20 0700  . cefTRIAXone (ROCEPHIN)  IV Stopped (09/27/20 1036)  . milrinone 0.375 mcg/kg/min (09/28/20 0534)  . norepinephrine (LEVOPHED) Adult infusion 6 mcg/min (09/28/20 0534)  . tirofiban 0.075 mcg/kg/min (09/28/20 0500)    PRN Medications: sodium chloride, acetaminophen, alum & mag hydroxide-simeth, fentaNYL (SUBLIMAZE) injection, ondansetron (ZOFRAN) IV, sodium chloride flush, sodium chloride flush, traZODone   Assessment/Plan   1. CAD: Inferoposterior STEMI.  LHC yesterday with serial critical stenoses in the proximal to mid RCA with thrombus, 90% distal left main stenosis extending to the ostial LAD and ostial LCx.  Patient was started on tirofiban and TCTS has evaluated for CABG.  At baseline, patient is functional, seems mainly limited by leg weakness from back arthritis.  He did have a recent CVA in 9/21, minimal residual.  I reviewed his echo, RV function looks quite good and PAPI has been adequate from [place].  I do not think he had an RV infarction. No further chest/epigastric pain. - Continue tirofiban for thrombotic disease in RCA.  - Continue ASA 81, atorvastatin 80.  Plavix held for CABG.  - I would favor CABG once he is optimized though he will be high risk with age, suspected PNA, and recent CVA as well as low cardiac output/cardiogenic shock.  However, at baseline he has reasonable function and I think that RV function is adequate.  I think peri-operative surgical Impella would be warranted.  Surgery is following.  2. Acute systolic CHF/cardiogenic shock: Ischemic  cardiomyopathy.  I reviewed echo, EF 30% range with mild MR, RV systolic function looks normal.  He is now on po Lasix with stable creatinine, CVP 6 today with CI 2.3 and co-ox still low at 51%.  NE 6 and milrinone remains 0.375.  SBP stable in 90s. - Wean NE as BP tolerates.  - Continue milrinone 0.375 mcg/kg/min, repeat co-ox. Though co-ox low, CI from [place] adequate and creatinine stable.  He feels good.  - Lasix 40 mg po daily.  - Digoxin 0.125.  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 As above, peri-op Impella 5.5 would be warranted.  3. CVA: Recent CVA in 9/21, had left-sided CVA with difficulty reading.  Impairment significantly improved. Carotid dopplers at the time showed > 70% RICA stenosis but < 33% LICA stenosis, so cryptogenic CVA.  Atrial fibrillation has not been detected. He has had pre-op assessment by neurology. Ideally, would wait longer after CVA before surgery, but based on coronary anatomy and recent STEMI, he will need revascularization as soon as feasible. Repeat carotid dopplers with only 40-59% RICA and CTA head/neck without significant carotid stenosis.  - If AF detected, will need anticoagulation.  Consider LINQ monitor if no AF seen this admission.  4. Vascular disease: 4.9 cm AAA on [place] this admission, also with carotid stenosis as above and penetrating ulcer distal aorta. 5. Asymmetric LLE swelling: Improved with diuresis.  Venous dopplers negative for DVT. - On heparin prophylaxis.  6. ID: Treating for LLL PNA, PCT elevated.  He was treated for PNA in 10/21.  Swallow study did not show aspiration.  Cultures NGTD.  He is on room air.  - Cover CAP with ceftriaxone/doxycycline.   Mobilize with PT. Discussed with CCM, think he will be ready for CABG perhaps on Thursday.  Would like to see a couple more days of abx given suspected PNA.   CRITICAL CARE Performed by: Loralie Champagne  Total critical care time:35 minutes  Critical care time was exclusive of  separately billable procedures and treating other patients.  Critical care was necessary to treat or prevent imminent or life-threatening deterioration.  Critical care was time spent personally by me on the following activities: development of treatment plan with patient and/or surrogate as well as nursing, discussions with consultants, evaluation of patient's response to treatment, examination of patient, obtaining history from patient or surrogate, ordering and performing treatments and interventions, ordering and review of laboratory studies, ordering and review of radiographic studies, pulse oximetry and re-evaluation of patient's condition.    Length of Stay: 3  Loralie Champagne, MD  09/28/2020, 8:27 AM  Advanced Heart Failure Team Pager [phone] (M-F; 7a - 4p)  Please contact [facility] for night-coverage after hours (4p -7a ) and weekends on [email or web address]

## 2020-09-28 NOTE — Progress Notes (Signed)
NAME:  Kevin Matthews, MRN:  [id number], DOB:  Jun 23, 1934, LOS: 3 ADMISSION DATE:  09/05/2020, CONSULTATION DATE: 09/26/2020 REFERRING MD: Rosaria Ferries, CHIEF COMPLAINT: Preoperative optimization.  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  Coronary angiography revealed surgical disease.  He has been referred for aortocoronary bypass.  Anatomy is significant for left main involvement.  Recent history includes a CVA in September 2021 affecting his left temporal and occipital lobe leading to difficulty reading.  He denies any associated aphasia or difficulty swallowing.  He was subsequently admitted to hospital with a possible pneumonia soon after his stroke raising the possibility of aspiration which might complicate recovery from cardiac surgery.  On reviewing his admission from 07/2020 he had presented with fever and a near syncopal episode denies any shortness of breath.  There was no obvious episode of aspiration at that time.  CT did show evidence of right-sided lobar pneumonia.  He suffers from generalized weakness from spinal stenosis but manages with all of his activities of daily living.  Past Medical History   Past Medical History:  Diagnosis Date  . Arthritis   . BPH (benign prostatic hyperplasia)   . GERD (gastroesophageal reflux disease)   . Heart murmur    dr Feliz Beam  . Hypercholesteremia      Past Surgical History:  Procedure Laterality Date  . BACK SURGERY     x5 cervical plate;  Marland Kitchen CAROTID ENDARTERECTOMY Left   . CATARACT EXTRACTION W/PHACO Right 07/27/2015   Procedure: CATARACT EXTRACTION PHACO AND INTRAOCULAR LENS PLACEMENT (IOC);  Surgeon: Rutherford Guys, MD;  Location: [facility];  Service: Ophthalmology;  Laterality: Right;  CDE:9.43  . CATARACT EXTRACTION W/PHACO Left 08/10/2015   Procedure: CATARACT EXTRACTION PHACO AND INTRAOCULAR  LENS PLACEMENT (IOC);  Surgeon: Rutherford Guys, MD;  Location: [facility];  Service: Ophthalmology;  Laterality: Left;  CDE:8.27  . LUMBAR LAMINECTOMY/DECOMPRESSION MICRODISCECTOMY N/A 01/07/2014   Procedure: Lumbar three-four, Lumbar four-five redo laminectomy and foraminotomy;  Surgeon: Ophelia Charter, MD;  Location: [facility];  Service: Neurosurgery;  Laterality: N/A;  Lumbar three-four, Lumbar four-five redo laminectomy and foraminotomy  . RIGHT/LEFT HEART CATH AND CORONARY ANGIOGRAPHY N/A 09/18/2020   Procedure: RIGHT/LEFT HEART CATH AND CORONARY ANGIOGRAPHY;  Surgeon: Sherren Mocha, MD;  Location: [facility];  Service: Cardiovascular;  Laterality: N/A;  . TONSILLECTOMY    . VASCULAR SURGERY       Micro:  11/27 blood cultures> 11/28 blood cultures>  Antibiotics:   Ceftriaxone 11/29> Doxycycline 11/29>  Interim history/subjective:  He denies complaints other than wanting to get out of bed. Having nightmares and hallucinations at night.  Objective   Blood pressure (!) 78/48, pulse 90, temperature 98.8 F (37.1 C), resp. rate (!) 26, height 5\' 9"  (1.753 m), weight 74.1 kg, SpO2 94 %. PAP: (11-60)/(-5-42) 37/26 CVP:  [3 mmHg-52 mmHg] 11 mmHg CO:  [3.6 L/min-5.3 L/min] 3.6 L/min CI:  [1.9 L/min/m2-2.7 L/min/m2] 1.9 L/min/m2      Intake/Output Summary (Last 24 hours) at 09/28/2020 0810 Last data filed at 09/28/2020 0534 Gross per 24 hour  Intake 1014.07 ml  Output 2130 ml  Net -1115.93 ml  net -3.7 for admission  Filed Weights   09/06/2020 1652 09/28/20 0530  Weight: 77.1 kg 74.1 kg    Examination: General: ill appearing elderly man sitting up in bed in NAD HEENT: [place]/AT, eyes anicteric, oral mucosa  moist Neck: RIJ introducer, no JVD Cardiac: S1S2, RRR, Zio patch in place Resp: breathing comfortably on RA with saturations ~95%, bibasilar rhales Abd: thin, soft, NT Extremities: no peripheral edema, no cyanosis Derm: Warm, dry, no rashes Neuro: awake, answering  questions appropriately, moving all extremities spontaneously, globally weak  CVP ~7, CI 2.3  Assessment & Plan:  Cardiogenic shock secondary to ICN Inferior wall STEMI Ischemic cardiomyopathy; ejection fraction 25% Multivessel CAD -Continue dual antiplatelet therapy-Aggrastat and aspirin -Continue milrinone -Continue norepinephrine -Continue atorvastatin -Continue digoxin -Ongoing evaluation for possible CABG-- likely later this week.  -Aggressively treat fevers -Optimize nutrition, medical management for stroke, pneumonia prior to OR  Recent CVA in Sept 2021 Carotid stenosis- stable on follow up imaging -Appreciate neurology's assistance -con't statin and antiplatelet therapy  RLL pneumonia Acute hypoxic respiratory failure- resolved. Acute pulmonary edema, dependent pleural effusions due to HFrEF -Doxycycline and ceftriaxone to complete 7 days -Sputum culture if able to produce one -Maintain SpO2 >90% -ARISCAT score = 99 (high risk; 41.2% risk of post-op pulmonary complications). From pre-op standpoint, optimizing oxygenation with mobility, IS is likely the only potential intervention short-term given need for more urgent intervention.  Spinal stenosis Cervical and lumbar myelopathy -PT, OT when stable; OOB to chair  CKD 3A -Renally dose meds, avoid nephrotoxic meds -Strict I's/O  Hyperglycemia- controlled without insulin -con't to monitor; can add SSI if needed   Ancillary tests (personally reviewed)  CBC: Recent Labs  Lab 09/24/2020 1658 09/16/2020 1706 09/28/2020 1839 09/27/2020 2057 09/26/20 0520 09/27/20 0617 09/28/20 0516  WBC 11.8*  --   --  20.5* 20.7* 16.2* 10.7*  NEUTROABS 8.9*  --   --   --   --   --   --   HGB 9.0*   < > 9.9*  9.9* 9.9* 9.4* 8.9* 8.2*  HCT 29.6*   < > 29.0*  29.0* 32.4* 30.9* 29.2* 26.5*  MCV 90.8  --   --  89.8 91.4 91.5 90.8  PLT 166  --   --  198 206 197 186   < > = values in this interval not displayed.    Basic Metabolic  Panel: Recent Labs  Lab 09/27/2020 1658 09/14/2020 1658 09/14/2020 1706 09/18/2020 1706 09/05/2020 1726 09/11/2020 1839 09/26/20 0520 09/27/20 0617 09/28/20 0516  NA 135   < > 138   < > 139 140  140 137 136 135  K 4.5   < > 4.5   < > 4.5 4.5  4.6 4.0 3.8 3.6  CL 106   < > 106  --  106  --  105 103 99  CO2 21*  --   --   --   --   --  22 24 25   GLUCOSE 142*   < > 140*  --  130*  --  147* 139* 125*  BUN 18   < > 19  --  18  --  16 17 16   CREATININE 1.32*   < > 1.20  --  1.20  --  1.21 1.33* 1.22  CALCIUM 8.4*  --   --   --   --   --  8.1* 8.3* 8.3*   < > = values in this interval not displayed.   GFR: Estimated Creatinine Clearance: 45.1 mL/min (by C-G formula based on SCr of 1.22 mg/dL). Recent Labs  Lab 09/07/2020 2057 09/26/20 0520 09/27/20 0617 09/28/20 0516  PROCALCITON  --   --  3.67  --   WBC 20.5* 20.7*  16.2* 10.7*    Liver Function Tests: Recent Labs  Lab 09/19/2020 1658 09/28/20 0516  AST 50* 47*  ALT 19 18  ALKPHOS 66 61  BILITOT 0.8 0.9  PROT 6.0* 5.7*  ALBUMIN 2.8* 2.3*   No results for input(s): LIPASE, AMYLASE in the last 168 hours. No results for input(s): AMMONIA in the last 168 hours.  ABG    Component Value Date/Time   PHART 7.347 (L) 09/18/2020 1839   PCO2ART 35.6 09/14/2020 1839   PO2ART 117 (H) 09/24/2020 1839   HCO3 20.1 09/19/2020 1839   HCO3 19.5 (L) 09/07/2020 1839   TCO2 21 (L) 09/19/2020 1839   TCO2 21 (L) 09/24/2020 1839   ACIDBASEDEF 6.0 (H) 09/08/2020 1839   ACIDBASEDEF 6.0 (H) 09/16/2020 1839   O2SAT 51.3 09/28/2020 0739     Coagulation Profile: Recent Labs  Lab 09/11/2020 1658  INR 1.2    Cardiac Enzymes: No results for input(s): CKTOTAL, CKMB, CKMBINDEX, TROPONINI in the last 168 hours.  HbA1C: Hgb A1c MFr Bld  Date/Time Value Ref Range Status  09/24/2020 04:58 PM 5.7 (H) 4.8 - 5.6 % Final    Comment:    (NOTE) Pre diabetes:          5.7%-6.4%  Diabetes:              >6.4%  Glycemic control for   <7.0% adults with  diabetes   07/29/2020 03:06 PM 5.8 (H) 4.8 - 5.6 % Final    Comment:    (NOTE) Pre diabetes:          5.7%-6.4%  Diabetes:              >6.4%  Glycemic control for   <7.0% adults with diabetes     CBG: Recent Labs  Lab 09/27/20 1121  GLUCAP 136*    This patient is critically ill with multiple organ system failure which requires frequent high complexity decision making, assessment, support, evaluation, and titration of therapies. This was completed through the application of advanced monitoring technologies and extensive interpretation of multiple databases. During this encounter critical care time was devoted to patient care services described in this note for 33 minutes.   Julian Hy, DO 09/28/20 8:10 AM [facility]

## 2020-09-29 ENCOUNTER — Inpatient Hospital Stay (HOSPITAL_COMMUNITY): Payer: No Typology Code available for payment source

## 2020-09-29 ENCOUNTER — Inpatient Hospital Stay (HOSPITAL_COMMUNITY): Payer: No Typology Code available for payment source | Admitting: Registered Nurse

## 2020-09-29 LAB — COOXEMETRY PANEL
Carboxyhemoglobin: 1 % (ref 0.5–1.5)
Methemoglobin: 0.5 % (ref 0.0–1.5)
O2 Saturation: 41.6 %
Total hemoglobin: 7.7 g/dL — ABNORMAL LOW (ref 12.0–16.0)

## 2020-09-29 LAB — BASIC METABOLIC PANEL
Anion gap: 13 (ref 5–15)
BUN: 25 mg/dL — ABNORMAL HIGH (ref 8–23)
CO2: 21 mmol/L — ABNORMAL LOW (ref 22–32)
Calcium: 8.4 mg/dL — ABNORMAL LOW (ref 8.9–10.3)
Chloride: 99 mmol/L (ref 98–111)
Creatinine, Ser: 1.35 mg/dL — ABNORMAL HIGH (ref 0.61–1.24)
GFR, Estimated: 52 mL/min — ABNORMAL LOW (ref >60)
Glucose, Bld: 142 mg/dL — ABNORMAL HIGH (ref 70–99)
Potassium: 4.4 mmol/L (ref 3.5–5.1)
Sodium: 133 mmol/L — ABNORMAL LOW (ref 135–145)

## 2020-09-29 LAB — CBC
HCT: 23.7 % — ABNORMAL LOW (ref 39.0–52.0)
Hemoglobin: 7.7 g/dL — ABNORMAL LOW (ref 13.0–17.0)
MCH: 28 pg (ref 26.0–34.0)
MCHC: 32.5 g/dL (ref 30.0–36.0)
MCV: 86.2 fL (ref 80.0–100.0)
Platelets: 245 10*3/uL (ref 150–400)
RBC: 2.75 MIL/uL — ABNORMAL LOW (ref 4.22–5.81)
RDW: 14.8 % (ref 11.5–15.5)
WBC: 11.6 10*3/uL — ABNORMAL HIGH (ref 4.0–10.5)
nRBC: 0 % (ref 0.0–0.2)

## 2020-09-29 LAB — TROPONIN I (HIGH SENSITIVITY): Troponin I (High Sensitivity): 9765 ng/L (ref <18)

## 2020-09-29 MED ORDER — SODIUM CHLORIDE 0.9 % IV SOLN: INTRAVENOUS | Status: DC | PRN

## 2020-09-29 MED ORDER — PANTOPRAZOLE SODIUM 40 MG IV SOLR: 40.0000 mg | Freq: Two times a day (BID) | INTRAVENOUS | Status: DC

## 2020-09-29 MED ORDER — LIDOCAINE HCL (PF) 1 % IJ SOLN
INTRAMUSCULAR | Status: AC
  Filled 2020-09-29: qty 30

## 2020-09-29 DEATH — deceased

## 2020-09-30 ENCOUNTER — Encounter (HOSPITAL_COMMUNITY): Payer: No Typology Code available for payment source

## 2020-09-30 LAB — CULTURE, BLOOD (ROUTINE X 2)
Culture: NO GROWTH
Culture: NO GROWTH
Special Requests: ADEQUATE

## 2020-09-30 SURGERY — CORONARY ARTERY BYPASS GRAFTING (CABG)
Anesthesia: General | Site: Chest

## 2020-09-30 MED FILL — Medication: Qty: 1 | Status: AC

## 2020-10-01 LAB — CULTURE, BLOOD (ROUTINE X 2)
Culture: NO GROWTH
Culture: NO GROWTH

## 2020-10-30 NOTE — Progress Notes (Signed)
This RN in room with patient when patient went unresponsive and asystole, compressions started and code blue called at 0508.

## 2020-10-30 NOTE — Progress Notes (Signed)
  Progress Note   Date: 09/28/2020  Patient Name: Kevin Matthews        MRN#: [id number]   Clarification of diagnosis: Pt requiring continuous O2 supplementation 2-3 L per notes review   Acute on chronic respiratory failure  - with hypoxia

## 2020-10-30 NOTE — Progress Notes (Signed)
RT attempted A line X 2. Unable to place.  Charge RT was going to attempt A line but pt refused. RN aware of pt refusal.

## 2020-10-30 NOTE — Progress Notes (Addendum)
87 year old male admitted with STEMI and found to have multivessel disease including thrombosis of the RCA with TIMI 3 flow. He was felt to have evidence of cardiogenic shock and started on Levophed and milrinone, as well as Aggrastat as a bridge to CABG. Per report, Aggrastat was stopped earlier today due to small-volume hematochezia. He may have also had 1 episode of vomiting with some blood clot reported, though vomited again with no blood per daytime nurse.   Tonight, he has had 5 grossly bloody bowel movements, RN estimates a few hundred mL in total. His Chux is soaked with bloody stool (mostly blood). His cuff pressures are highly variable, but overall MAP seems to be > 65. He has been more tachycardic over the last few hours from 100s-120s. He is delirious and tachypneic. Extremities are warm.   PA tracing appears more like a wedge waveform. Pulled back the PAC quite a bit and flushed with some improvement and less dampening but still reading 40s/30s and there is a high degree of respiratory variation. Attempted to wedge, mean in the low 30s, but does not seem completely reliable. RN attempted output but unable to flush through CVP port.   Repeated CBC, hgb slightly down at 7.7 from 8.1. He is having 5/10 chest pain as well. Fortunately no further hematochezia over the last 2-3 hours. Will obtain arterial line for more reliable pressure and then transfuse 1 unit blood with Lasix. Will recheck troponin as well. Can probably hold off on GI consult until the morning if he does not continue to have significant hematochezia, as hemoglobin is fairly stable. He received a single dose of IV PPI this evening, but I will schedule BID for now.  ADDENDUM (5:50am): Arterial line attempted by RT unsuccessfully 3 times. I attempted using micropuncture with sluggish arterial blood flow but lost access prior to wiring due to issues with ongoing delirium and likely more proximal arterial occlusion. Levophed dose  increasing slightly since my last evaluation. BMET from midnight showed normal K+ and bicarb of 21. Decided to proceed with transfusion with concomitant diuretic. However, at 5am the patient was noted to suddenly lose consciousness with asystole as initial rhythm on the monitor. Prior to this he was oxygenating well and there was no clear change in his clinical status since last assessment. ACLS resuscitation was performed for over 30 mintutes (refer to code documentation), during which time rhythm transitioned to refractory VF despite 3 attempted defibrillations followed by PEA despite multiple interventions and no reversible etiology identified. Echocardiogram confirmed lack of pericardial effusion and lack of any systolic motion. Time of death 5:30 am. Cause of death likely related to sequelae of posterior/inferior STEMI complicated by cardiogenic shock. His daughter Abelino Derrick was notified and would like to notify her mother personally. She was grateful for the care he received.

## 2020-10-30 NOTE — Progress Notes (Signed)
   Oct 26, 2020 0500  Clinical Encounter Type  Visited With Health care provider  Visit Type Code  Referral From Other (Comment) (Page )  Consult/Referral To Chaplain  Chaplain responded to Code [place] for Kevin Matthews. Medical Team did all they could to revive Pt.  Checked in with staff to make sure they were okay, each said they were good and thank you for asking and coming to code. MD was on the phone with Mrs. Adger and the Pt's nurse said she would be arriving with in the hour.  I did a personal internal prayer for Kevin Matthews and will follow-up with family upon arrival.    Madelynn Done [phone]

## 2020-10-30 NOTE — Discharge Summary (Signed)
Discharge/Death Summary    Patient ID: Kevin Matthews MRN: [id number]; DOB: 1934-06-17  Admit date: 08/31/2020 Discharge date: 10/29/2020  Primary Care Provider: [facility]  Primary Cardiologist: Sanda Klein, MD  Discharge Diagnoses    Principal Problem:   STEMI (ST elevation myocardial infarction) ([place]) inferior posterior Active Problems:   CAD (coronary artery disease), native coronary artery   Cardiogenic shock (HCC)   Hypotension   Acute ST elevation myocardial infarction (STEMI) ([place])    History of Present Illness     Kevin Matthews is a 87 y.o. male with history of AAA and penetrating aortic ulcer, HTN, HLD, stroke in 06/2020 and recent hospitalization 07/2020 for PNA. Pt presented to [facility] with severe epigastric pain was found to have ST elevation in inferior lead with ST depression in septal leads consistent with inferioposterior STEMI.  Hospital Course     Consultants: PCCM, CT surgery, neurology  He went to the ER and was evaluated for epigastric pain with inferoposterior MI noted. He was hypotensive, norepinephrine started.  He was taken for cath that revealed critical stenosis in proximal and mid RCA with thrombus, as well as 90% distal LM stenosis extending into the ostial LAD and ostial LCx.  RHC showed mean RA 15, PA 42/25, mean PCWP 27, CI 2.29.  No intervention, patient was started on Aggrastat and was evaluated for CABG. IABP was not placed due to aortic disease. He was maintained on milrinone and Levophed and diuresed for surgical optimziation. Echo reviewed at bedside, EF 25-30%, mild MR, normal RV size and systolic function.  Aggrastat was stopped 11/30 due to small-volume hematochezia. He was started on IV PPI as well and hemoglobin trended. Overnight he had had 5 grossly bloody bowel movements and has been delirious as well. There were poor PAC waveforms despite troubleshooting, though his wedge pressure was elevated  despite blood loss. Hemoglobin was slightly decreased but nearly stable. Plan overnight was to place an arterial line, monitor for further hematochezia, and transfuse blood with Lasix with GI consultation in the morning. Labs from midnight showed normal K+ and bicarb of 21.   Prior to the above interventions, the patient was noted to suddenly lose consciousness with asystole as initial rhythm on the monitor. Prior to this he was oxygenating well and there was no clear change in his clinical status since last assessment. ACLS resuscitation was performed for over 30 mintutes (refer to code documentation), during which time rhythm transitioned to refractory VF despite 3 attempted defibrillations followed by PEA despite multiple interventions and no reversible etiology identified. Echocardiogram confirmed lack of pericardial effusion and lack of any systolic motion.   Time of death was 5:30 am on 10/29/20. Cause of death likely related to sequelae of posterior/inferior STEMI complicated by cardiogenic shock. Family was notified and autopsy declined.   Labs & Radiologic Studies    CBC Recent Labs    09/28/20 1627 29-Oct-2020 0018  WBC 11.3* 11.6*  HGB 8.1* 7.7*  HCT 26.2* 23.7*  MCV 90.0 86.2  PLT 246 017   Basic Metabolic Panel Recent Labs    09/28/20 1627 10-29-2020 0018  NA 133* 133*  K 4.0 4.4  CL 100 99  CO2 22 21*  GLUCOSE 206* 142*  BUN 21 25*  CREATININE 1.20 1.35*  CALCIUM 8.3* 8.4*   Liver Function Tests Recent Labs    09/28/20 0516 09/28/20 1627  AST 47* 63*  ALT 18 22  ALKPHOS 61 63  BILITOT 0.9 0.7  PROT 5.7* 6.1*  ALBUMIN 2.3* 2.4*   No results for input(s): LIPASE, AMYLASE in the last 72 hours. High Sensitivity Troponin:   Recent Labs  Lab 09/02/2020 1658 09/06/2020 2057 10/21/20 0229  TROPONINIHS 7,362* 11,750* 9,765*    _____________  CT ANGIO HEAD W OR WO CONTRAST  Result Date: 09/27/2020 CLINICAL DATA:  Stroke or TIA EXAM: CT ANGIOGRAPHY HEAD AND NECK  TECHNIQUE: Multidetector CT imaging of the head and neck was performed using the standard protocol during bolus administration of intravenous contrast. Multiplanar CT image reconstructions and MIPs were obtained to evaluate the vascular anatomy. Carotid stenosis measurements (when applicable) are obtained utilizing NASCET criteria, using the distal internal carotid diameter as the denominator. CONTRAST:  39mL OMNIPAQUE IOHEXOL 350 MG/ML SOLN COMPARISON:  None. FINDINGS: CT HEAD FINDINGS Brain: There is no mass, hemorrhage or extra-axial collection. The size and configuration of the ventricles and extra-axial CSF spaces are normal. There is encephalomalacia at the site of recent left temporal occipital infarct. There is hypoattenuation of the periventricular white matter, most commonly indicating chronic ischemic microangiopathy. Skull: The visualized skull base, calvarium and extracranial soft tissues are normal. Sinuses/Orbits: No fluid levels or advanced mucosal thickening of the visualized paranasal sinuses. No mastoid or middle ear effusion. The orbits are normal. CTA NECK FINDINGS SKELETON: There is no bony spinal canal stenosis. No lytic or blastic lesion. OTHER NECK: Normal pharynx, larynx and major salivary glands. No cervical lymphadenopathy. Unremarkable thyroid gland. UPPER CHEST: Small pleural effusions. AORTIC ARCH: There is calcific atherosclerosis of the aortic arch. There is no aneurysm, dissection or hemodynamically significant stenosis of the visualized portion of the aorta. Normal variant aortic arch branching pattern with the left vertebral artery arising independently from the aortic arch. The visualized proximal subclavian arteries are widely patent. RIGHT CAROTID SYSTEM: No dissection, occlusion or aneurysm. Mild atherosclerotic calcification at the carotid bifurcation without hemodynamically significant stenosis. LEFT CAROTID SYSTEM: No dissection, occlusion or aneurysm. Mild atherosclerotic  calcification at the carotid bifurcation without hemodynamically significant stenosis. VERTEBRAL ARTERIES: Left dominant configuration. Both origins are clearly patent. There is no dissection, occlusion or flow-limiting stenosis to the skull base (V1-V3 segments). CTA HEAD FINDINGS POSTERIOR CIRCULATION: --Vertebral arteries: Normal V4 segments. --Inferior cerebellar arteries: Normal. --Basilar artery: Normal. --Superior cerebellar arteries: Normal. --Posterior cerebral arteries (PCA): Normal. ANTERIOR CIRCULATION: --Intracranial internal carotid arteries: Atherosclerotic calcification of the internal carotid arteries at the skull base without hemodynamically significant stenosis. --Anterior cerebral arteries (ACA): Normal. Both A1 segments are present. Patent anterior communicating artery (a-comm). --Middle cerebral arteries (MCA): Normal. VENOUS SINUSES: As permitted by contrast timing, patent. ANATOMIC VARIANTS: None Review of the MIP images confirms the above findings. IMPRESSION: 1. No emergent large vessel occlusion or high-grade stenosis of the intracranial arteries. 2. Encephalomalacia at the site of recent left temporal occipital infarct. 3. Small pleural effusions. Aortic Atherosclerosis (ICD10-I70.0). Electronically Signed   By: Ulyses Jarred M.D.   On: 09/27/2020 23:50   CT ANGIO NECK W OR WO CONTRAST  Result Date: 09/27/2020 CLINICAL DATA:  Stroke or TIA EXAM: CT ANGIOGRAPHY HEAD AND NECK TECHNIQUE: Multidetector CT imaging of the head and neck was performed using the standard protocol during bolus administration of intravenous contrast. Multiplanar CT image reconstructions and MIPs were obtained to evaluate the vascular anatomy. Carotid stenosis measurements (when applicable) are obtained utilizing NASCET criteria, using the distal internal carotid diameter as the denominator. CONTRAST:  56mL OMNIPAQUE IOHEXOL 350 MG/ML SOLN COMPARISON:  None. FINDINGS: CT HEAD FINDINGS Brain: There is no  mass,  hemorrhage or extra-axial collection. The size and configuration of the ventricles and extra-axial CSF spaces are normal. There is encephalomalacia at the site of recent left temporal occipital infarct. There is hypoattenuation of the periventricular white matter, most commonly indicating chronic ischemic microangiopathy. Skull: The visualized skull base, calvarium and extracranial soft tissues are normal. Sinuses/Orbits: No fluid levels or advanced mucosal thickening of the visualized paranasal sinuses. No mastoid or middle ear effusion. The orbits are normal. CTA NECK FINDINGS SKELETON: There is no bony spinal canal stenosis. No lytic or blastic lesion. OTHER NECK: Normal pharynx, larynx and major salivary glands. No cervical lymphadenopathy. Unremarkable thyroid gland. UPPER CHEST: Small pleural effusions. AORTIC ARCH: There is calcific atherosclerosis of the aortic arch. There is no aneurysm, dissection or hemodynamically significant stenosis of the visualized portion of the aorta. Normal variant aortic arch branching pattern with the left vertebral artery arising independently from the aortic arch. The visualized proximal subclavian arteries are widely patent. RIGHT CAROTID SYSTEM: No dissection, occlusion or aneurysm. Mild atherosclerotic calcification at the carotid bifurcation without hemodynamically significant stenosis. LEFT CAROTID SYSTEM: No dissection, occlusion or aneurysm. Mild atherosclerotic calcification at the carotid bifurcation without hemodynamically significant stenosis. VERTEBRAL ARTERIES: Left dominant configuration. Both origins are clearly patent. There is no dissection, occlusion or flow-limiting stenosis to the skull base (V1-V3 segments). CTA HEAD FINDINGS POSTERIOR CIRCULATION: --Vertebral arteries: Normal V4 segments. --Inferior cerebellar arteries: Normal. --Basilar artery: Normal. --Superior cerebellar arteries: Normal. --Posterior cerebral arteries (PCA): Normal. ANTERIOR  CIRCULATION: --Intracranial internal carotid arteries: Atherosclerotic calcification of the internal carotid arteries at the skull base without hemodynamically significant stenosis. --Anterior cerebral arteries (ACA): Normal. Both A1 segments are present. Patent anterior communicating artery (a-comm). --Middle cerebral arteries (MCA): Normal. VENOUS SINUSES: As permitted by contrast timing, patent. ANATOMIC VARIANTS: None Review of the MIP images confirms the above findings. IMPRESSION: 1. No emergent large vessel occlusion or high-grade stenosis of the intracranial arteries. 2. Encephalomalacia at the site of recent left temporal occipital infarct. 3. Small pleural effusions. Aortic Atherosclerosis (ICD10-I70.0). Electronically Signed   By: Ulyses Jarred M.D.   On: 09/27/2020 23:50   CARDIAC CATHETERIZATION  Result Date: 09/13/2020 1.  Acute inferoposterior/RV infarction secondary to critical stenoses throughout the proximal and mid RCA with evidence of thrombus.  The patient currently has TIMI-3 flow and will be treated with IV Aggrastat pending surgical evaluation. 2.  Severe distal left main stenosis extending into the LAD and the circumflex 3.  Moderately severe segmental LV dysfunction with akinesis of the entire inferior wall, preserved wall motion of the anterolateral wall 4.  Cardiogenic shock requiring Levophed 10 mcg throughout the procedure, also resuscitated with fluids. Discussed patient's case with Dr. Orvan Seen who came in to evaluate him.  He is not a good candidate for an intra-aortic balloon pump in the setting of his thoracoabdominal pathology.  He will be treated with inotropic support and currently is stable with a systolic blood pressure of 115 mmHg on 10 mcg of Levophed alone.  We will continue Aggrastat while he undergoes surgical evaluation for consideration of high risk CABG.  Diurese as tolerated.  Use invasive hemodynamics to help manage volume status/vasopressors.  US AORTA/ILIAC  DOPPLER COMPLETE  Result Date: 09/27/2020 CLINICAL DATA:  Abdominal aortic aneurysm. EXAM: ULTRASOUND OF ABDOMINAL AORTA TECHNIQUE: Ultrasound examination of the abdominal aorta and proximal common iliac arteries was performed to evaluate for aneurysm. Additional color and Doppler images of the distal aorta were obtained to document patency. COMPARISON:  CTA of  the abdomen and pelvis on 02/16/2020 FINDINGS: Abdominal aortic measurements as follows: Proximal:  2.6 x 2.5 cm Mid:  2.6 x 2.5 cm Distal:  4.7 x 4.9 cm The abdominal aorta demonstrates atherosclerosis. Aneurysmal segment of the abdominal aorta demonstrates mural thrombus. Patent: Yes, peak systolic velocity is 34 cm/sec Right common iliac artery: 1.3 cm Left common iliac artery: 1.6 cm IMPRESSION: Distal abdominal aortic aneurysm measuring approximately 4.7 x 4.9 cm by ultrasound. This is fairly similar to the prior CTA measurement of 4.7 cm. Electronically Signed   By: Aletta Edouard M.D.   On: 09/27/2020 07:57   DG CHEST PORT 1 VIEW  Result Date: 09/26/2020 CLINICAL DATA:  Congestive heart failure EXAM: PORTABLE CHEST 1 VIEW COMPARISON:  September 25, 2020 FINDINGS: There is a small left pleural effusion with ill-defined airspace opacity in the left base. Interval clearing of ill-defined opacity from the right upper lobe. Right lung essentially clear at this time. Heart is upper normal in size with pulmonary vascularity normal. There is aortic atherosclerosis. Swan-Ganz catheter tip is in the right main pulmonary artery. Stimulator lead tip in lower thoracic region. Postoperative change noted in the lower cervical region. Surgical clips in each neck region noted. Monitor device overlies the left hemithorax. IMPRESSION: Small left pleural effusion with suspected focus of pneumonia left base. Lungs elsewhere clear. Stable cardiac silhouette. No pneumothorax. Central catheter tip in superior vena cava. Stimulator lead tip lower thoracic region.  Aortic Atherosclerosis (ICD10-I70.0). Electronically Signed   By: Lowella Grip III M.D.   On: 09/26/2020 11:07   DG CHEST PORT 1 VIEW  Result Date: 09/12/2020 CLINICAL DATA:  Shortness of breath. EXAM: PORTABLE CHEST 1 VIEW COMPARISON:  August 07, 2020 FINDINGS: Mild atelectasis and/or residual infiltrate is seen within the right upper lobe. This is decreased in severity when compared to the prior study. Mild atelectasis and/or early infiltrate is also seen within the retrocardiac region of the left lung base. A radiopaque stimulator device is seen overlying the lateral aspect of the upper left lung. There is no evidence of a pleural effusion or pneumothorax. The heart size and mediastinal contours are within normal limits. There is moderate severity calcification of the aortic arch. A radiopaque fusion plate and screws are seen overlying the cervical spine. Stable spinal stimulator wire positioning is noted. IMPRESSION: Mild right upper lobe atelectasis and/or residual infiltrate, decreased in severity when compared to the prior study. Electronically Signed   By: Virgina Norfolk M.D.   On: 09/10/2020 20:39   ECHOCARDIOGRAM COMPLETE  Result Date: 09/26/2020    ECHOCARDIOGRAM REPORT   Patient Name:   Kevin Matthews Date of Exam: 09/26/2020 Medical Rec #:  [id number]         Height:       69.0 in Accession #:    [id number]        Weight:       170.0 lb Date of Birth:  [birth year]         BSA:          1.928 m Patient Age:    4 years          BP:           112/75 mmHg Patient Gender: M                 HR:           76 bpm. Exam Location:  Inpatient Procedure: 2D Echo, Cardiac Doppler, Color Doppler and Intracardiac  Opacification Agent Indications:    121-121.4 ST elevation (STEMI) and non-ST elevation (NSTEMI)                 nyocardial infarction  History:        Patient has prior history of Echocardiogram examinations, most                 recent 07/30/2020. Signs/Symptoms:Chest Pain.  Prior stroke.  Sonographer:    Merrie Roof RDCS Referring Phys: [place]  1. There is no left ventricular thrombus with Definity infusion. Left ventricular ejection fraction, by estimation, is 30 to 35%. The left ventricle has moderately decreased function. The left ventricle demonstrates regional wall motion abnormalities (see scoring diagram/findings for description). Left ventricular diastolic parameters are consistent with Grade II diastolic dysfunction (pseudonormalization). Wall motion suggests multi vessel CAD. Many of the area of akinesis/hypokinesis have preserved  wall thickness and show significant microbubble uptake during Definity infusion, suggesting they are viable (particularly the inferior and inferoseptal walls). The true apex appears thin and has poor microbubble contrast uptake, suggesting scar.  2. Right ventricular systolic function is normal. The right ventricular size is normal.  3. Left atrial size was mild to moderately dilated.  4. The mitral valve is normal in structure. Mild mitral valve regurgitation.  5. The aortic valve is tricuspid. There is mild calcification of the aortic valve. There is moderate thickening of the aortic valve. Aortic valve regurgitation is not visualized. Mild to moderate aortic valve sclerosis/calcification is present, without any evidence of aortic stenosis. FINDINGS  Left Ventricle: There is no left ventricular thrombus with Definity infusion. Left ventricular ejection fraction, by estimation, is 30 to 35%. The left ventricle has moderately decreased function. The left ventricle demonstrates regional wall motion abnormalities. The left ventricular internal cavity size was normal in size. There is no left ventricular hypertrophy. Left ventricular diastolic parameters are consistent with Grade II diastolic dysfunction (pseudonormalization). Normal left ventricular  filling pressure.  LV Wall Scoring: The mid and distal inferior wall and  apex are akinetic. The mid and distal anterior septum, posterior wall, mid inferoseptal segment, and apical anterior segment are hypokinetic. The anterior wall, antero-lateral wall, basal anteroseptal segment, apical lateral segment, basal inferior segment, and basal inferoseptal segment are normal. Wall motion suggests multi vessel CAD. Many of the area of akinesis/hypokinesis have preserved wall thickness and show significant microbubble uptake during Definity infusion, suggesting they are viable (particularly the inferior and inferoseptal walls). The true apex appears thin and has poor microbubble contrast uptake, suggesting scar. Right Ventricle: The right ventricular size is normal. No increase in right ventricular wall thickness. Right ventricular systolic function is normal. Left Atrium: Left atrial size was mild to moderately dilated. Right Atrium: Right atrial size was normal in size. Pericardium: There is no evidence of pericardial effusion. Mitral Valve: The mitral valve is normal in structure. Mild mitral valve regurgitation. Tricuspid Valve: The tricuspid valve is normal in structure. Tricuspid valve regurgitation is trivial. Aortic Valve: The aortic valve is tricuspid. There is mild calcification of the aortic valve. There is moderate thickening of the aortic valve. Aortic valve regurgitation is not visualized. Mild to moderate aortic valve sclerosis/calcification is present, without any evidence of aortic stenosis. Pulmonic Valve: The pulmonic valve was not well visualized. Pulmonic valve regurgitation is not visualized. Aorta: The aortic root and ascending aorta are structurally normal, with no evidence of dilitation. IAS/Shunts: No atrial level shunt detected by color flow Doppler. Additional Comments: A venous  catheter is visualized.  LEFT VENTRICLE PLAX 2D LVIDd:         5.10 cm      Diastology LVIDs:         3.80 cm      LV e' medial:    6.53 cm/s LV PW:         0.90 cm      LV E/e' medial:  14.6  LV IVS:        0.80 cm      LV e' lateral:   5.98 cm/s LVOT diam:     2.00 cm      LV E/e' lateral: 16.0 LV SV:         46 LV SV Index:   24 LVOT Area:     3.14 cm  LV Volumes (MOD) LV vol d, MOD A2C: 104.9 ml LV vol d, MOD A4C: 139.0 ml LV vol s, MOD A2C: 78.9 ml LV vol s, MOD A4C: 98.9 ml LV SV MOD A2C:     26.0 ml LV SV MOD A4C:     139.0 ml RIGHT VENTRICLE          IVC RV Basal diam:  3.80 cm  IVC diam: 2.20 cm LEFT ATRIUM             Index       RIGHT ATRIUM           Index LA diam:        4.40 cm 2.28 cm/m  RA Area:     18.50 cm LA Vol (A2C):   83.0 ml 43.05 ml/m RA Volume:   60.40 ml  31.33 ml/m LA Vol (A4C):   52.9 ml 27.44 ml/m LA Biplane Vol: 68.3 ml 35.42 ml/m  AORTIC VALVE LVOT Vmax:   88.60 cm/s LVOT Vmean:  64.400 cm/s LVOT VTI:    0.147 m  AORTA Ao Root diam: 3.20 cm Ao Asc diam:  3.70 cm MV E velocity: 95.39 cm/s MV A velocity: 58.18 cm/s  SHUNTS MV E/A ratio:  1.64        Systemic VTI:  0.15 m                            Systemic Diam: 2.00 cm Dani Gobble Croitoru MD Electronically signed by Sanda Klein MD Signature Date/Time: 09/26/2020/10:39:22 AM    Final    VAS US DOPPLER PRE CABG  Result Date: 09/27/2020 PREOPERATIVE VASCULAR EVALUATION  Indications:            Pre-CABG. Risk Factors:           Hypertension, hyperlipidemia, prior CVA. Vascular Interventions: Bilateral CEA 2016. Limitations:            poor patient cooperation, hypersomnolence, movement,                         line in right neck, recent right radial artery access. Comparison Study:       07/30/2020 carotid artery duplex- >70% right ICA                         stenosis. Performing Technologist: Maudry Mayhew MHA, RDMS, RVT, RDCS  Examination Guidelines: A complete evaluation includes B-mode imaging, spectral Doppler, color Doppler, and power Doppler as needed of all accessible portions of each vessel. Bilateral testing is considered an integral part of a complete examination. Limited examinations for  reoccurring  indications may be performed as noted.  Right Carotid Findings: +----------+--------+--------+--------+-------------------------+--------------+           PSV cm/sEDV cm/sStenosisDescribe                 Comments       +----------+--------+--------+--------+-------------------------+--------------+ CCA Prox                                                   Not visualized +----------+--------+--------+--------+-------------------------+--------------+ CCA Distal                                                 Not visualized +----------+--------+--------+--------+-------------------------+--------------+ ICA Prox  167     64      40-59%  heterogenous, irregular                                                   and calcific                            +----------+--------+--------+--------+-------------------------+--------------+ ICA Mid   53      24                                                      +----------+--------+--------+--------+-------------------------+--------------+ ICA Distal46      15                                                      +----------+--------+--------+--------+-------------------------+--------------+ ECA                                                        Not visualized +----------+--------+--------+--------+-------------------------+--------------+ Portions of this table do not appear on this page. +----------+--------+-------+------------+------------+           PSV cm/sEDV cmsDescribe    Arm Pressure +----------+--------+-------+------------+------------+ Subclavian               Not assessed             +----------+--------+-------+------------+------------+ +---------+--------+--------+------------+ VertebralPSV cm/sEDV cm/sNot assessed +---------+--------+--------+------------+ Left Carotid Findings: +----------+--------+--------+--------+--------------------------+--------+           PSV cm/sEDV  cm/sStenosisDescribe                  Comments +----------+--------+--------+--------+--------------------------+--------+ CCA Prox  65      15                                                 +----------+--------+--------+--------+--------------------------+--------+  CCA Distal66      15              smooth and heterogenous            +----------+--------+--------+--------+--------------------------+--------+ ICA Prox  61      20              smooth and heterogenous            +----------+--------+--------+--------+--------------------------+--------+ ICA Distal99      45                                                 +----------+--------+--------+--------+--------------------------+--------+ ECA       77      13              heterogenous and irregular         +----------+--------+--------+--------+--------------------------+--------+ +----------+--------+--------+----------------+------------+ SubclavianPSV cm/sEDV cm/sDescribe        Arm Pressure +----------+--------+--------+----------------+------------+           120             Multiphasic, WNL             +----------+--------+--------+----------------+------------+ +---------+--------+---+--------+-+---------+ VertebralPSV cm/s124EDV cm/s5Antegrade +---------+--------+---+--------+-+---------+  ABI Findings: +--------+------------------+-----+---------+----------------------------------+ Right   Rt Pressure (mmHg)IndexWaveform Comment                            +--------+------------------+-----+---------+----------------------------------+ Brachial                       triphasicPressure not obtained due to                                               recent radial artery access        +--------+------------------+-----+---------+----------------------------------+ PTA                            triphasic                                    +--------+------------------+-----+---------+----------------------------------+ DP                             triphasic                                   +--------+------------------+-----+---------+----------------------------------+ +--------+------------------+-----+---------+-------+ Left    Lt Pressure (mmHg)IndexWaveform Comment +--------+------------------+-----+---------+-------+ Brachial79                     triphasic        +--------+------------------+-----+---------+-------+ PTA                            triphasic        +--------+------------------+-----+---------+-------+ DP                             triphasic        +--------+------------------+-----+---------+-------+  Right Doppler  Findings: +-----------+--------+-----+---------+-----------------------------------------+ Site       PressureIndexDoppler  Comments                                  +-----------+--------+-----+---------+-----------------------------------------+ Brachial                triphasicPressure not obtained due to recent                                        radial artery access                      +-----------+--------+-----+---------+-----------------------------------------+ Radial                  biphasic                                           +-----------+--------+-----+---------+-----------------------------------------+ Ulnar                   biphasic                                           +-----------+--------+-----+---------+-----------------------------------------+ Palmar Arch                      Signal is unaffected with radial                                           compression, decreases >50% with ulnar                                     compression.                              +-----------+--------+-----+---------+-----------------------------------------+  Left Doppler Findings:  +-----------+--------+-----+---------+--------------------+ Site       PressureIndexDoppler  Comments             +-----------+--------+-----+---------+--------------------+ Brachial   79           triphasic                     +-----------+--------+-----+---------+--------------------+ Radial                  biphasic                      +-----------+--------+-----+---------+--------------------+ Ulnar                   triphasic                     +-----------+--------+-----+---------+--------------------+ Palmar Arch                      Within normal limits +-----------+--------+-----+---------+--------------------+  Summary: Right Carotid: Velocities in the right ICA are consistent with an upper-range  40-59% stenosis. Left Carotid: Velocities in the left ICA are consistent with a 1-39% stenosis. Vertebrals:  Left vertebral artery demonstrates antegrade flow. Right vertebral              artery was not visualized. Subclavians: Right subclavian artery was not visualized. Normal flow              hemodynamics were seen in the left subclavian artery. Right Upper Extremity: Doppler waveforms remain within normal limits with right radial compression. Doppler waveforms decrease >50% with right ulnar compression.  Left Upper Extremity: Doppler waveforms remain within normal limits with left radial compression. Doppler waveforms remain within normal limits with left ulnar compression.  Pedal waveforms: Bilateral pedal artery waveforms are within normal limits at rest. Electronically signed by Ruta Hinds MD on 09/27/2020 at 3:54:03 PM.    Final    VAS [place] LOWER EXTREMITY VENOUS (DVT)  Result Date: 09/27/2020  Lower Venous DVT Study Indications: Edema.  Limitations: Limited patient cooperation, position. Comparison Study: No prior study Performing Technologist: Maudry Mayhew MHA, RDMS, RVT, RDCS  Examination Guidelines: A complete evaluation includes B-mode  imaging, spectral Doppler, color Doppler, and power Doppler as needed of all accessible portions of each vessel. Bilateral testing is considered an integral part of a complete examination. Limited examinations for reoccurring indications may be performed as noted. The reflux portion of the exam is performed with the patient in reverse Trendelenburg.  Right Technical Findings: Not visualized segments include CFV.  +---------+---------------+---------+-----------+----------+--------------+ LEFT     CompressibilityPhasicitySpontaneityPropertiesThrombus Aging +---------+---------------+---------+-----------+----------+--------------+ CFV      Full           Yes      Yes                                 +---------+---------------+---------+-----------+----------+--------------+ SFJ      Full                                                        +---------+---------------+---------+-----------+----------+--------------+ FV Prox  Full                                                        +---------+---------------+---------+-----------+----------+--------------+ FV Mid   Full                                                        +---------+---------------+---------+-----------+----------+--------------+ FV DistalFull                                                        +---------+---------------+---------+-----------+----------+--------------+ PFV      Full                                                        +---------+---------------+---------+-----------+----------+--------------+  POP      Full           Yes      Yes                                 +---------+---------------+---------+-----------+----------+--------------+ PTV      Full                                                        +---------+---------------+---------+-----------+----------+--------------+ PERO     Full                                                         +---------+---------------+---------+-----------+----------+--------------+     Summary: LEFT: - There is no evidence of deep vein thrombosis in the lower extremity.  - No cystic structure found in the popliteal fossa.  *See table(s) above for measurements and observations. Electronically signed by Ruta Hinds MD on 09/27/2020 at 3:53:48 PM.    Final     Signed, Marykay Lex, MD October 09, 2020, 6:37 AM

## 2020-10-30 NOTE — Anesthesia Procedure Notes (Addendum)
Procedure Name: Intubation Date/Time: 2020-10-26 5:15 AM Performed by: Jearld Pies, CRNA Pre-anesthesia Checklist: Patient identified, Emergency Drugs available, Suction available and Patient being monitored Patient Re-evaluated:Patient Re-evaluated prior to induction Oxygen Delivery Method: Ambu bag Preoxygenation: Pre-oxygenation with 100% oxygen Laryngoscope Size: Glidescope and 4 Grade View: Grade II Tube type: Oral Tube size: 7.5 mm Number of attempts: 1 Airway Equipment and Method: Stylet and Video-laryngoscopy Placement Confirmation: ETT inserted through vocal cords under direct vision,  breath sounds checked- equal and bilateral and CO2 detector Secured at: 22 cm Tube secured with: Tape Dental Injury: Teeth and Oropharynx as per pre-operative assessment  Comments: This CRNA responded to [facility] event, upon arrival, noted chest compressions and mask ventilation via AMBU bag 100% FiO2, Glidescope utilized, noted bloody secretions around vocal cords, passed ETT without difficulty, +CO2 detector color change and bilateral breath sounds present, RT secured ETT with ICU device.

## 2020-10-30 DEATH — deceased

## 2021-03-16 ENCOUNTER — Telehealth: Payer: Self-pay | Admitting: Cardiovascular Disease

## 2021-03-16 NOTE — Telephone Encounter (Signed)
Spoke with Anderson Malta with [facility] and instructed her to consult PCP for [facility] orders. She was grateful for callback.

## 2021-03-16 NOTE — Telephone Encounter (Signed)
Anderson Malta is calling wanting to know if Dr. Burt Knack would be willing to sign [facility] plan of care forms. She states they have been trying to contact the [facility] to have them sign, but the patient has been taken out of their system since passing so they have gotten nowhere with them. She states they had about 8 visits with Kyung Rudd before he passed for compliance. She is requesting Dr. Burt Knack be the one to sign due to him being the last person to see him in the hospital. Please advise.

## 2021-11-21 IMAGING — CT CT CHEST W/ CM
2 of 3 series · 15 of 36 positions shown, 18 images · IV contrast (Omnipaque or Isovue)
Comparison: Chest radiograph dated 08/07/2020.

CLINICAL DATA: 86-year-old male with pneumonia.

EXAM:
CT CHEST WITH CONTRAST
TECHNIQUE: Multidetector CT imaging of the chest was performed during
intravenous contrast administration.
CONTRAST:  75mL OMNIPAQUE IOHEXOL 300 MG/ML  SOLN

[Series 2: routine chest with · axial · 0.73mm/px · z∈[+945,+1239]mm · 12 of 173 slices shown, 15 images]
[im 13/173  mediastinal]
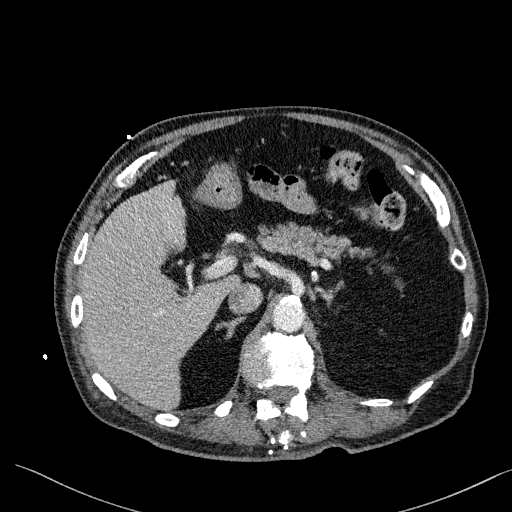
[im 13/173  lung]
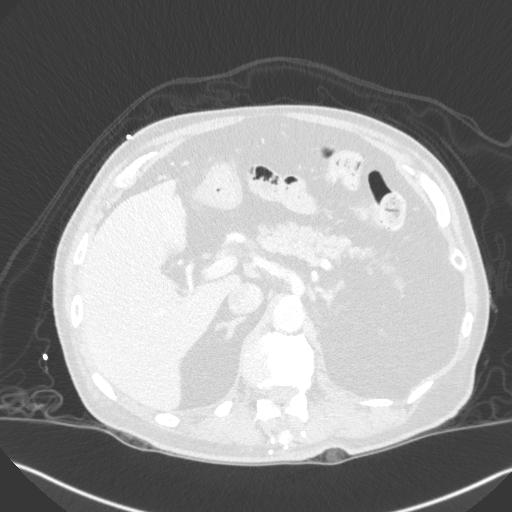
[im 26/173  lung]
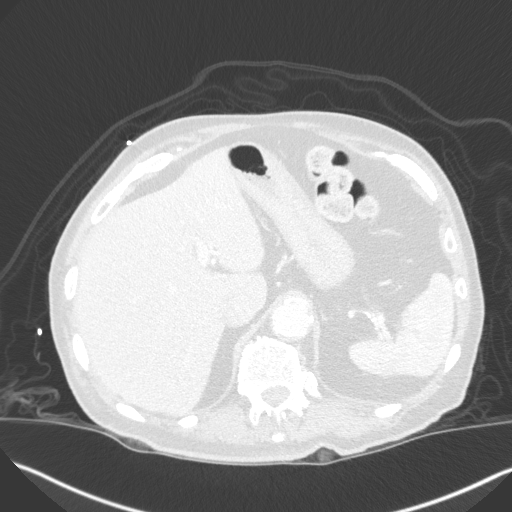
[im 39/173  lung]
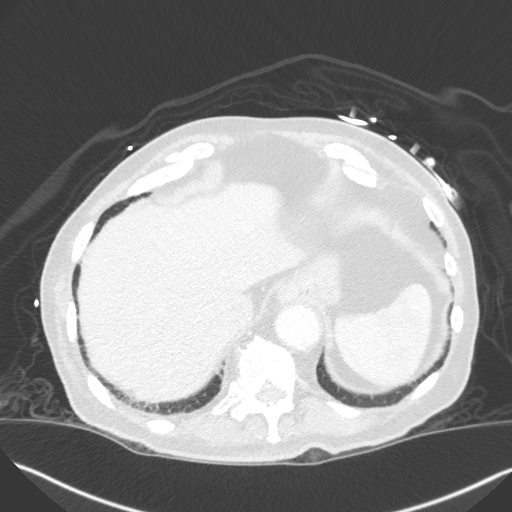
[im 51/173  lung]
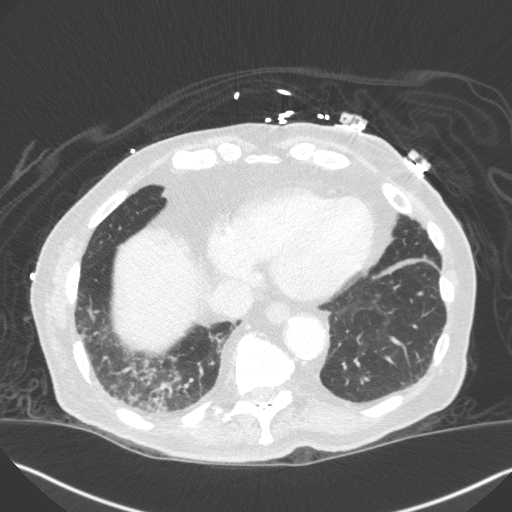
[im 64/173  mediastinal]
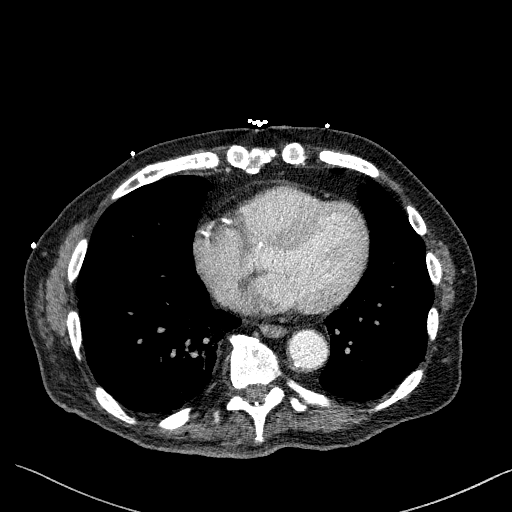
[im 64/173  lung]
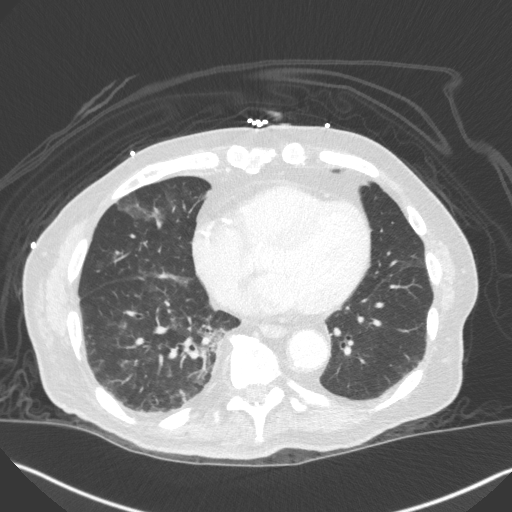
[im 77/173  lung]
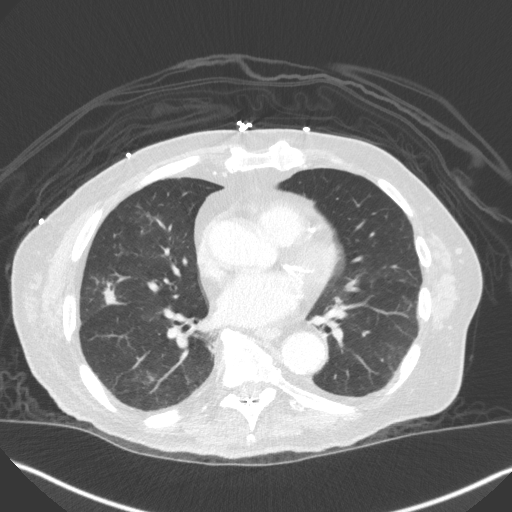
[im 96/173  lung]
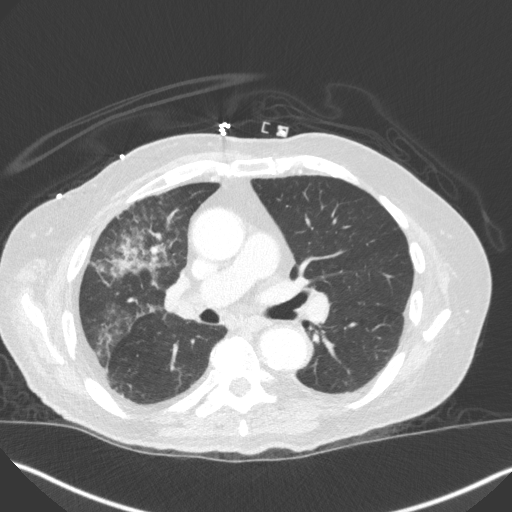
[im 109/173  lung]
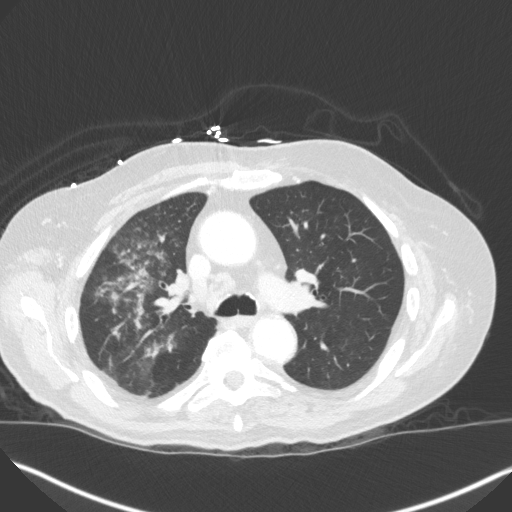
[im 122/173  mediastinal]
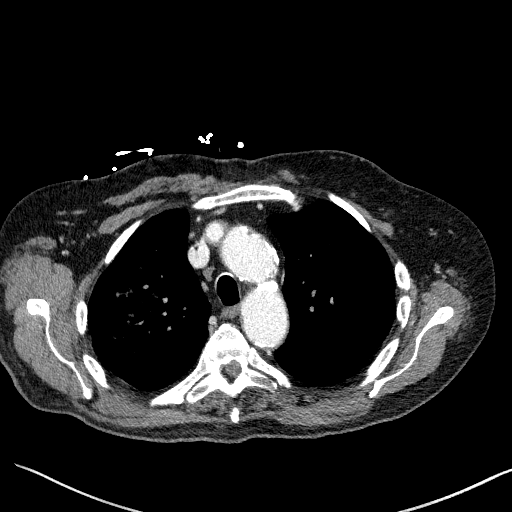
[im 122/173  lung]
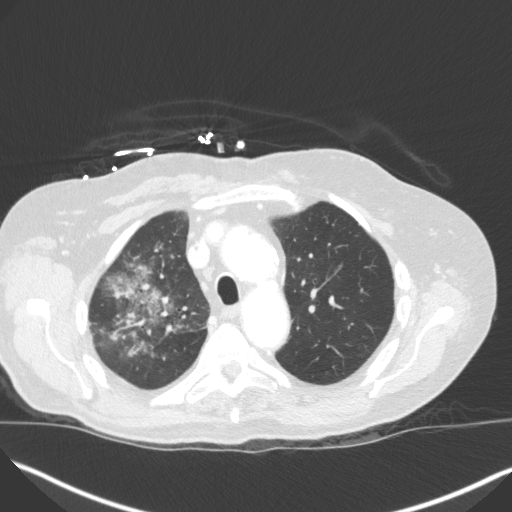
[im 134/173  lung]
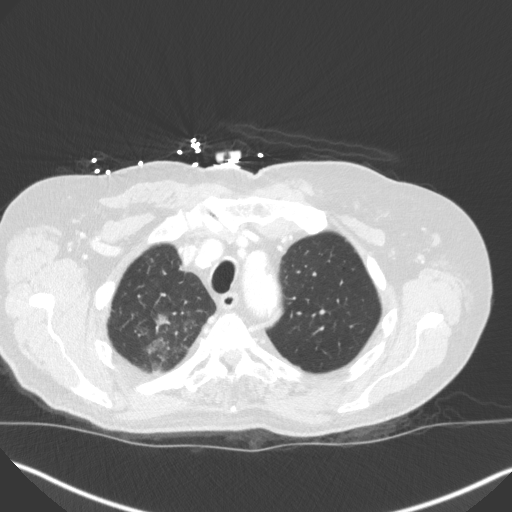
[im 147/173  lung]
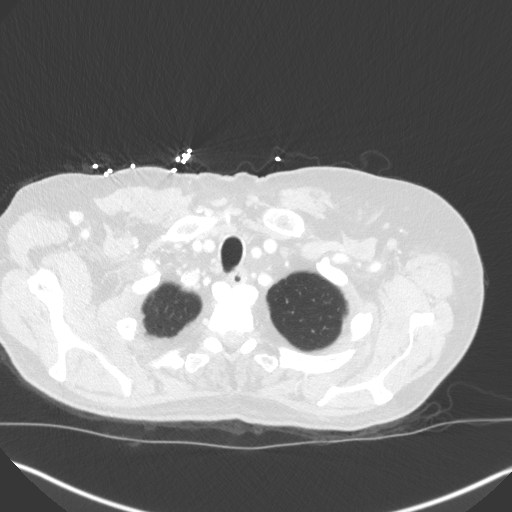
[im 160/173  lung]
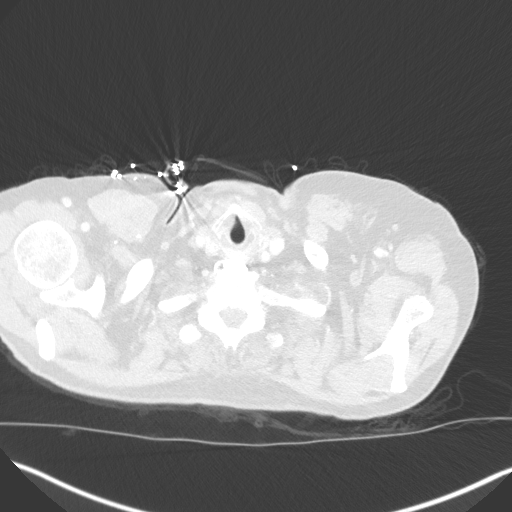

[Series 5: coronal · coronal · 0.74mm/px · 3 of 138 slices shown]
[im 28/138  lung]
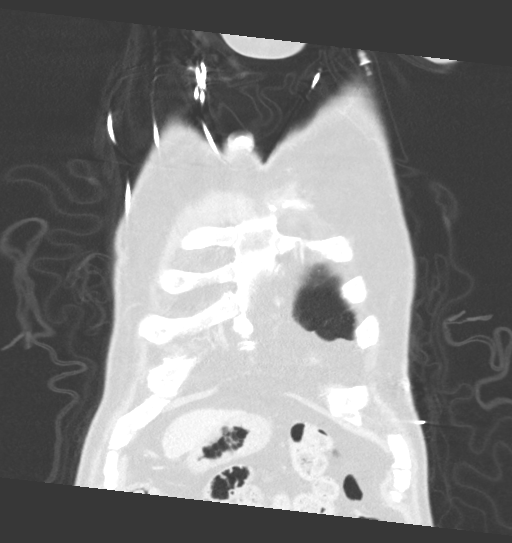
[im 55/138  lung]
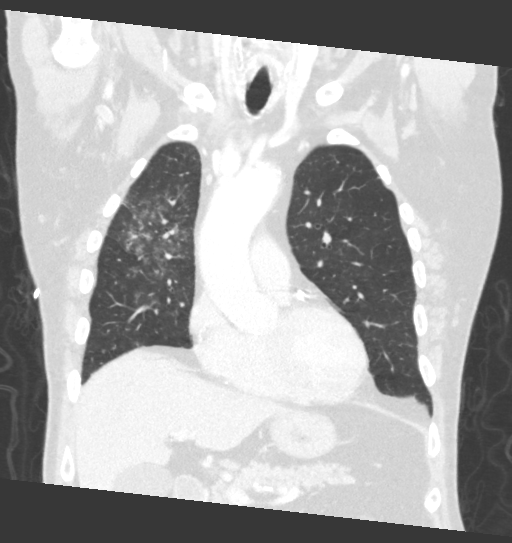
[im 83/138  lung]
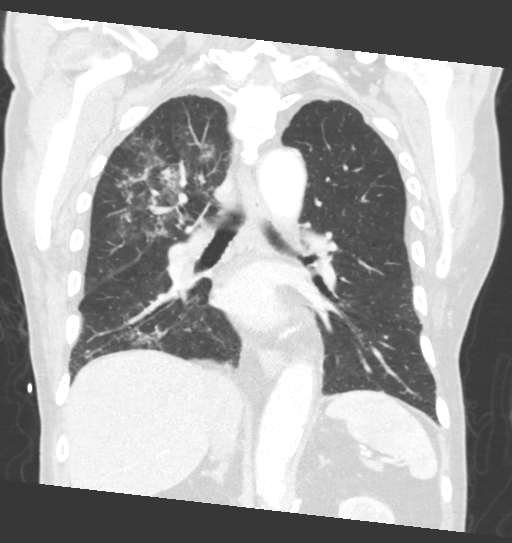

[15 of 36 positions shown; findings below may reference images not displayed]

FINDINGS: Cardiovascular: There is no cardiomegaly or pericardial effusion.
Advanced 3 vessel coronary vascular calcification. There is advanced
atherosclerotic calcification of the thoracic aorta. No aneurysmal
dilatation or dissection. The origins of the great vessels of the
aortic arch appear patent. The central pulmonary arteries are
unremarkable.

Mediastinum/Nodes: Mildly enlarged right paratracheal lymph nodes,
likely reactive. The esophagus is grossly unremarkable. No
mediastinal fluid collection.

Lungs/Pleura: Clusters of ground-glass and nodular densities
primarily in the right lung and right upper lobe most consistent
with multifocal pneumonia, likely viral or atypical in etiology.
Aspiration is not excluded. Clinical correlation is recommended.
There is no pleural effusion pneumothorax. The central airways are
patent.

Upper Abdomen: No acute abnormality.

Musculoskeletal: Degenerative changes of the spine. No acute osseous
pathology. Thoracic spinal stimulator wire. Lower cervical ACDF.
IMPRESSION: Multifocal pneumonia, likely viral or atypical in etiology.
Aspiration is not excluded. Clinical correlation is recommended.

## 2022-01-10 IMAGING — DX DG CHEST 1V PORT
1 series · 1 of 1 positions shown · non-contrast
Comparison: September 25, 2020

CLINICAL DATA: Congestive heart failure

EXAM:
PORTABLE CHEST 1 VIEW

[chest]
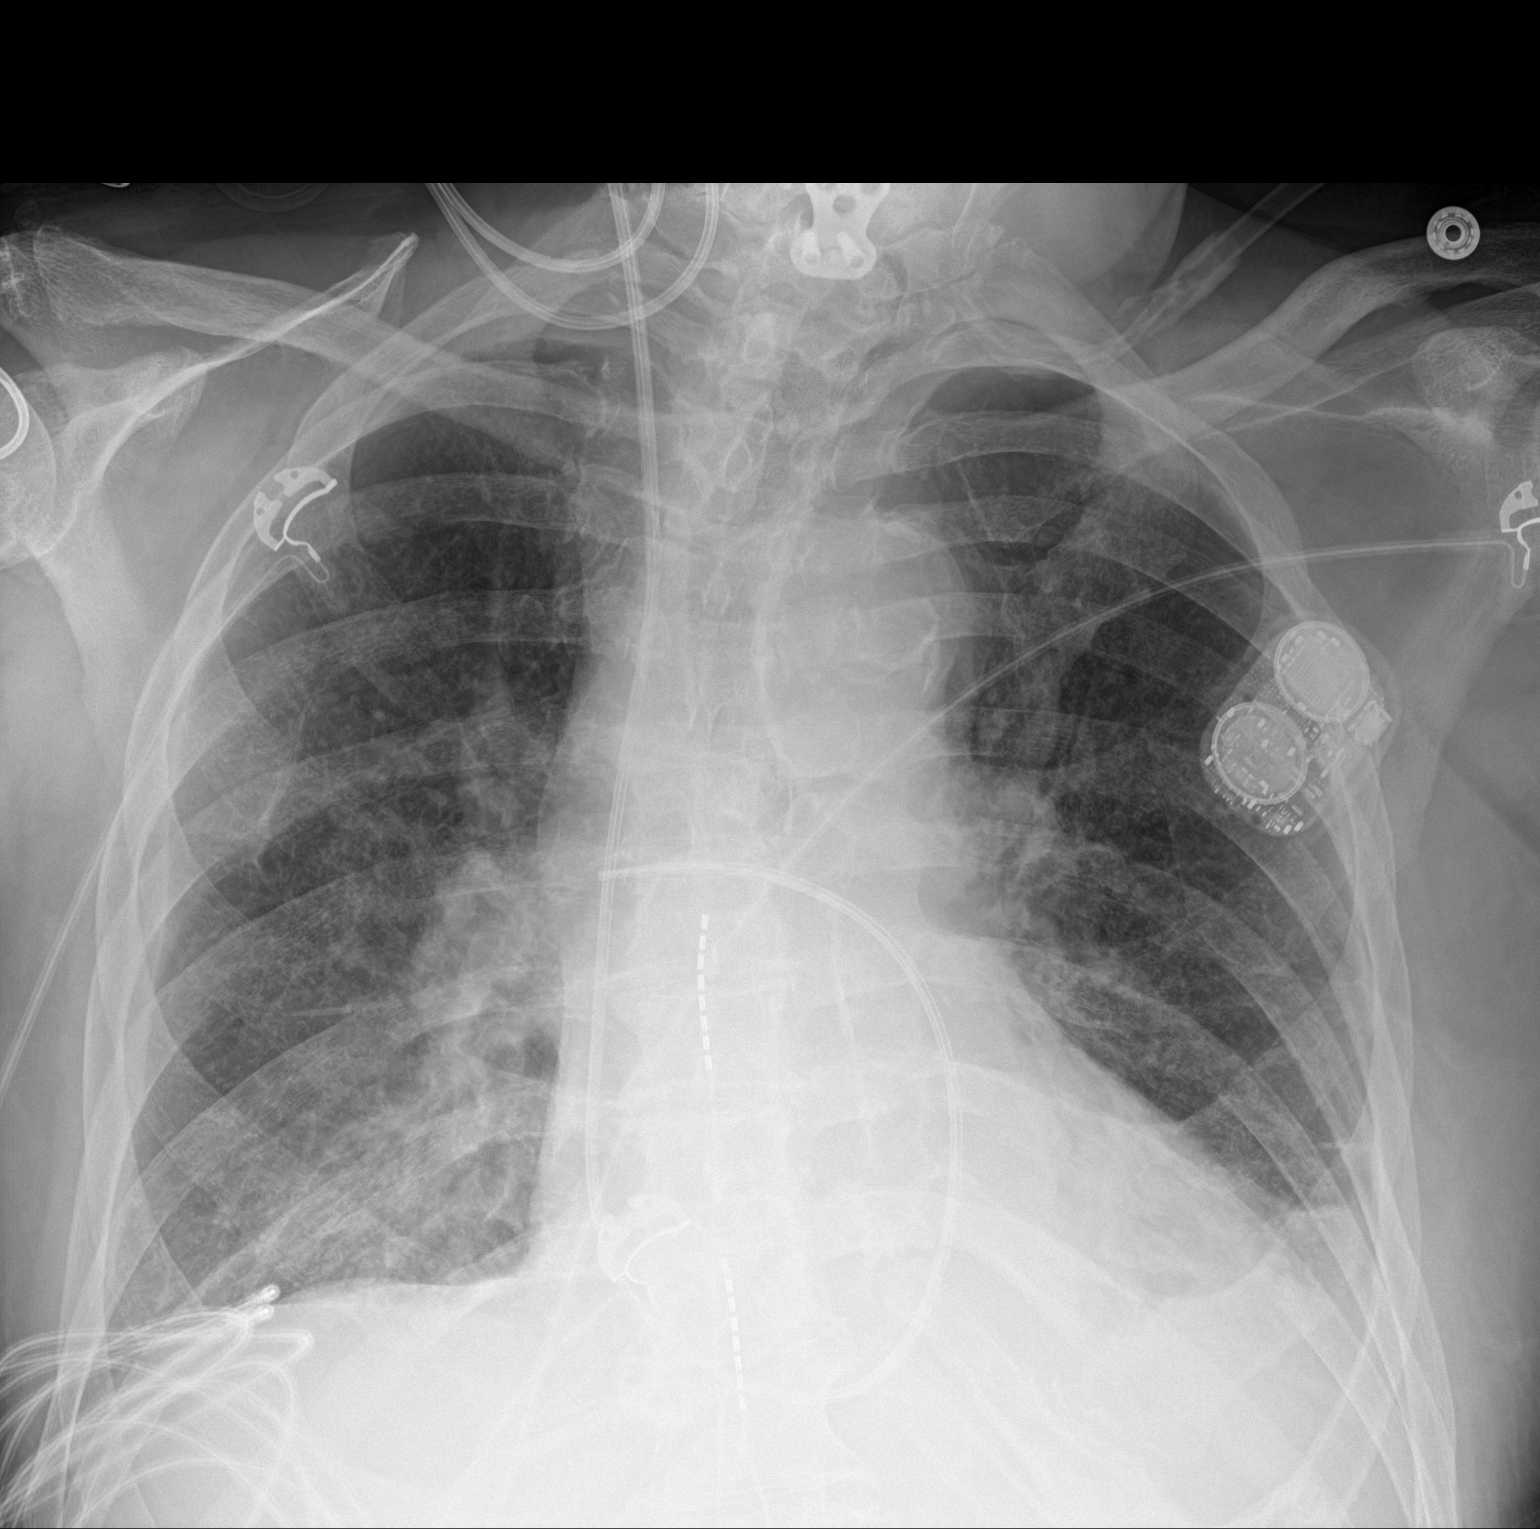

[1 of 1 positions shown; findings below may reference images not displayed]

FINDINGS: There is a small left pleural effusion with ill-defined airspace
opacity in the left base. Interval clearing of ill-defined opacity
from the right upper lobe. Right lung essentially clear at this
time.

Heart is upper normal in size with pulmonary vascularity normal.
There is aortic atherosclerosis. Swan-Ganz catheter tip is in the
right main pulmonary artery. Stimulator lead tip in lower thoracic
region. Postoperative change noted in the lower cervical region.
Surgical clips in each neck region noted. Monitor device overlies
the left hemithorax.
IMPRESSION: Small left pleural effusion with suspected focus of pneumonia left
base. Lungs elsewhere clear. Stable cardiac silhouette. No
pneumothorax. Central catheter tip in superior vena cava. Stimulator
lead tip lower thoracic region.

Aortic Atherosclerosis ([id number]).
